# Patient Record
Sex: Female | Born: 1980 | Race: White | Hispanic: No | Smoking: Former smoker
Health system: Southern US, Community
[De-identification: ages and names within clinical notes are randomized; demographics above are authoritative.]

## PROBLEM LIST (undated history)

## (undated) DIAGNOSIS — F419 Anxiety disorder, unspecified: Secondary | ICD-10-CM

## (undated) DIAGNOSIS — E079 Disorder of thyroid, unspecified: Secondary | ICD-10-CM

## (undated) DIAGNOSIS — F32A Depression, unspecified: Secondary | ICD-10-CM

## (undated) DIAGNOSIS — R51 Headache: Secondary | ICD-10-CM

## (undated) DIAGNOSIS — R519 Headache, unspecified: Secondary | ICD-10-CM

## (undated) DIAGNOSIS — G8929 Other chronic pain: Secondary | ICD-10-CM

## (undated) DIAGNOSIS — N730 Acute parametritis and pelvic cellulitis: Secondary | ICD-10-CM

## (undated) DIAGNOSIS — N3010 Interstitial cystitis (chronic) without hematuria: Secondary | ICD-10-CM

## (undated) DIAGNOSIS — R87619 Unspecified abnormal cytological findings in specimens from cervix uteri: Secondary | ICD-10-CM

## (undated) DIAGNOSIS — Z765 Malingerer [conscious simulation]: Secondary | ICD-10-CM

## (undated) DIAGNOSIS — N83209 Unspecified ovarian cyst, unspecified side: Secondary | ICD-10-CM

## (undated) DIAGNOSIS — F329 Major depressive disorder, single episode, unspecified: Secondary | ICD-10-CM

## (undated) DIAGNOSIS — IMO0002 Reserved for concepts with insufficient information to code with codable children: Secondary | ICD-10-CM

## (undated) DIAGNOSIS — T7840XA Allergy, unspecified, initial encounter: Secondary | ICD-10-CM

## (undated) DIAGNOSIS — M069 Rheumatoid arthritis, unspecified: Secondary | ICD-10-CM

## (undated) DIAGNOSIS — F1110 Opioid abuse, uncomplicated: Secondary | ICD-10-CM

## (undated) DIAGNOSIS — M542 Cervicalgia: Secondary | ICD-10-CM

## (undated) DIAGNOSIS — R000 Tachycardia, unspecified: Secondary | ICD-10-CM

## (undated) HISTORY — PX: TUBAL LIGATION: SHX77

## (undated) HISTORY — DX: Depression, unspecified: F32.A

## (undated) HISTORY — PX: ABDOMINAL HYSTERECTOMY: SHX81

## (undated) HISTORY — PX: LAPAROSCOPIC TUBAL LIGATION: SUR803

## (undated) HISTORY — DX: Allergy, unspecified, initial encounter: T78.40XA

## (undated) HISTORY — DX: Major depressive disorder, single episode, unspecified: F32.9

## (undated) HISTORY — DX: Reserved for concepts with insufficient information to code with codable children: IMO0002

## (undated) HISTORY — PX: HAND SURGERY: SHX662

## (undated) HISTORY — DX: Unspecified abnormal cytological findings in specimens from cervix uteri: R87.619

---
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
## 2002-01-11 ENCOUNTER — Inpatient Hospital Stay (HOSPITAL_COMMUNITY): Admission: AD | Admit: 2002-01-11 | Discharge: 2002-01-14 | Payer: Self-pay | Admitting: Obstetrics and Gynecology

## 2002-06-30 ENCOUNTER — Emergency Department (HOSPITAL_COMMUNITY): Admission: EM | Admit: 2002-06-30 | Discharge: 2002-06-30 | Payer: Self-pay | Admitting: Emergency Medicine

## 2002-08-28 ENCOUNTER — Inpatient Hospital Stay (HOSPITAL_COMMUNITY): Admission: AD | Admit: 2002-08-28 | Discharge: 2002-08-28 | Payer: Self-pay | Admitting: Obstetrics and Gynecology

## 2002-08-30 ENCOUNTER — Inpatient Hospital Stay (HOSPITAL_COMMUNITY): Admission: AD | Admit: 2002-08-30 | Discharge: 2002-08-30 | Payer: Self-pay | Admitting: Obstetrics and Gynecology

## 2002-09-09 ENCOUNTER — Inpatient Hospital Stay (HOSPITAL_COMMUNITY): Admission: AD | Admit: 2002-09-09 | Discharge: 2002-09-09 | Payer: Self-pay | Admitting: Family Medicine

## 2002-09-11 ENCOUNTER — Encounter: Payer: Self-pay | Admitting: Obstetrics and Gynecology

## 2002-09-11 ENCOUNTER — Ambulatory Visit (HOSPITAL_COMMUNITY): Admission: RE | Admit: 2002-09-11 | Discharge: 2002-09-11 | Payer: Self-pay | Admitting: Obstetrics and Gynecology

## 2002-10-20 ENCOUNTER — Encounter: Payer: Self-pay | Admitting: *Deleted

## 2002-10-20 ENCOUNTER — Inpatient Hospital Stay (HOSPITAL_COMMUNITY): Admission: AD | Admit: 2002-10-20 | Discharge: 2002-10-20 | Payer: Self-pay | Admitting: Family Medicine

## 2002-10-24 ENCOUNTER — Inpatient Hospital Stay (HOSPITAL_COMMUNITY): Admission: AD | Admit: 2002-10-24 | Discharge: 2002-10-24 | Payer: Self-pay | Admitting: Obstetrics and Gynecology

## 2002-12-16 ENCOUNTER — Encounter: Payer: Self-pay | Admitting: Emergency Medicine

## 2002-12-16 ENCOUNTER — Emergency Department (HOSPITAL_COMMUNITY): Admission: EM | Admit: 2002-12-16 | Discharge: 2002-12-17 | Payer: Self-pay | Admitting: Emergency Medicine

## 2003-02-13 ENCOUNTER — Emergency Department (HOSPITAL_COMMUNITY): Admission: EM | Admit: 2003-02-13 | Discharge: 2003-02-13 | Payer: Self-pay | Admitting: Emergency Medicine

## 2003-04-08 ENCOUNTER — Emergency Department (HOSPITAL_COMMUNITY): Admission: EM | Admit: 2003-04-08 | Discharge: 2003-04-08 | Payer: Self-pay | Admitting: Emergency Medicine

## 2003-04-08 ENCOUNTER — Encounter: Payer: Self-pay | Admitting: Emergency Medicine

## 2003-04-09 ENCOUNTER — Emergency Department (HOSPITAL_COMMUNITY): Admission: EM | Admit: 2003-04-09 | Discharge: 2003-04-09 | Payer: Self-pay | Admitting: Emergency Medicine

## 2003-07-04 ENCOUNTER — Emergency Department (HOSPITAL_COMMUNITY): Admission: EM | Admit: 2003-07-04 | Discharge: 2003-07-04 | Payer: Self-pay | Admitting: Emergency Medicine

## 2003-07-21 ENCOUNTER — Emergency Department (HOSPITAL_COMMUNITY): Admission: EM | Admit: 2003-07-21 | Discharge: 2003-07-21 | Payer: Self-pay | Admitting: Emergency Medicine

## 2003-07-30 ENCOUNTER — Inpatient Hospital Stay (HOSPITAL_COMMUNITY): Admission: AD | Admit: 2003-07-30 | Discharge: 2003-07-30 | Payer: Self-pay | Admitting: Obstetrics & Gynecology

## 2003-08-07 ENCOUNTER — Inpatient Hospital Stay (HOSPITAL_COMMUNITY): Admission: AD | Admit: 2003-08-07 | Discharge: 2003-08-08 | Payer: Self-pay | Admitting: *Deleted

## 2003-08-13 ENCOUNTER — Inpatient Hospital Stay (HOSPITAL_COMMUNITY): Admission: AD | Admit: 2003-08-13 | Discharge: 2003-08-13 | Payer: Self-pay | Admitting: Obstetrics & Gynecology

## 2003-10-03 ENCOUNTER — Inpatient Hospital Stay (HOSPITAL_COMMUNITY): Admission: AD | Admit: 2003-10-03 | Discharge: 2003-10-03 | Payer: Self-pay | Admitting: Obstetrics and Gynecology

## 2003-11-21 ENCOUNTER — Emergency Department (HOSPITAL_COMMUNITY): Admission: EM | Admit: 2003-11-21 | Discharge: 2003-11-21 | Payer: Self-pay | Admitting: Emergency Medicine

## 2003-12-29 ENCOUNTER — Inpatient Hospital Stay (HOSPITAL_COMMUNITY): Admission: AD | Admit: 2003-12-29 | Discharge: 2003-12-29 | Payer: Self-pay | Admitting: *Deleted

## 2004-01-02 ENCOUNTER — Emergency Department (HOSPITAL_COMMUNITY): Admission: EM | Admit: 2004-01-02 | Discharge: 2004-01-02 | Payer: Self-pay | Admitting: Emergency Medicine

## 2004-03-05 ENCOUNTER — Inpatient Hospital Stay (HOSPITAL_COMMUNITY): Admission: AD | Admit: 2004-03-05 | Discharge: 2004-03-05 | Payer: Self-pay | Admitting: Obstetrics and Gynecology

## 2004-03-22 ENCOUNTER — Emergency Department (HOSPITAL_COMMUNITY): Admission: EM | Admit: 2004-03-22 | Discharge: 2004-03-22 | Payer: Self-pay | Admitting: Emergency Medicine

## 2004-05-03 ENCOUNTER — Emergency Department (HOSPITAL_COMMUNITY): Admission: EM | Admit: 2004-05-03 | Discharge: 2004-05-03 | Payer: Self-pay | Admitting: Emergency Medicine

## 2004-07-02 ENCOUNTER — Emergency Department (HOSPITAL_COMMUNITY): Admission: EM | Admit: 2004-07-02 | Discharge: 2004-07-02 | Payer: Self-pay | Admitting: Emergency Medicine

## 2004-08-28 ENCOUNTER — Emergency Department (HOSPITAL_COMMUNITY): Admission: EM | Admit: 2004-08-28 | Discharge: 2004-08-28 | Payer: Self-pay | Admitting: Emergency Medicine

## 2005-01-25 ENCOUNTER — Emergency Department (HOSPITAL_COMMUNITY): Admission: EM | Admit: 2005-01-25 | Discharge: 2005-01-25 | Payer: Self-pay | Admitting: Emergency Medicine

## 2005-02-11 ENCOUNTER — Inpatient Hospital Stay (HOSPITAL_COMMUNITY): Admission: AD | Admit: 2005-02-11 | Discharge: 2005-02-12 | Payer: Self-pay | Admitting: Obstetrics and Gynecology

## 2005-05-26 ENCOUNTER — Emergency Department (HOSPITAL_COMMUNITY): Admission: EM | Admit: 2005-05-26 | Discharge: 2005-05-27 | Payer: Self-pay | Admitting: Emergency Medicine

## 2005-05-31 ENCOUNTER — Emergency Department (HOSPITAL_COMMUNITY): Admission: EM | Admit: 2005-05-31 | Discharge: 2005-05-31 | Payer: Self-pay | Admitting: Emergency Medicine

## 2005-08-13 ENCOUNTER — Inpatient Hospital Stay (HOSPITAL_COMMUNITY): Admission: AD | Admit: 2005-08-13 | Discharge: 2005-08-13 | Payer: Self-pay | Admitting: Obstetrics & Gynecology

## 2005-08-14 ENCOUNTER — Encounter: Admission: RE | Admit: 2005-08-14 | Discharge: 2005-08-14 | Payer: Self-pay | Admitting: Neurology

## 2005-08-28 ENCOUNTER — Other Ambulatory Visit: Admission: RE | Admit: 2005-08-28 | Discharge: 2005-08-28 | Payer: Self-pay | Admitting: Obstetrics and Gynecology

## 2005-11-23 ENCOUNTER — Emergency Department (HOSPITAL_COMMUNITY): Admission: EM | Admit: 2005-11-23 | Discharge: 2005-11-23 | Payer: Self-pay | Admitting: *Deleted

## 2006-04-12 ENCOUNTER — Emergency Department (HOSPITAL_COMMUNITY): Admission: EM | Admit: 2006-04-12 | Discharge: 2006-04-12 | Payer: Self-pay | Admitting: Emergency Medicine

## 2007-01-20 ENCOUNTER — Emergency Department (HOSPITAL_COMMUNITY): Admission: EM | Admit: 2007-01-20 | Discharge: 2007-01-20 | Payer: Self-pay | Admitting: Emergency Medicine

## 2007-09-04 ENCOUNTER — Emergency Department (HOSPITAL_COMMUNITY): Admission: EM | Admit: 2007-09-04 | Discharge: 2007-09-04 | Payer: Self-pay | Admitting: Emergency Medicine

## 2007-11-08 ENCOUNTER — Emergency Department (HOSPITAL_COMMUNITY): Admission: EM | Admit: 2007-11-08 | Discharge: 2007-11-08 | Payer: Self-pay | Admitting: Emergency Medicine

## 2007-12-09 ENCOUNTER — Emergency Department (HOSPITAL_COMMUNITY): Admission: EM | Admit: 2007-12-09 | Discharge: 2007-12-09 | Payer: Self-pay | Admitting: Emergency Medicine

## 2008-03-28 ENCOUNTER — Emergency Department (HOSPITAL_COMMUNITY): Admission: EM | Admit: 2008-03-28 | Discharge: 2008-03-28 | Payer: Self-pay | Admitting: Physician Assistant

## 2008-07-18 ENCOUNTER — Emergency Department (HOSPITAL_COMMUNITY): Admission: EM | Admit: 2008-07-18 | Discharge: 2008-07-18 | Payer: Self-pay | Admitting: Emergency Medicine

## 2008-09-02 ENCOUNTER — Emergency Department (HOSPITAL_COMMUNITY): Admission: EM | Admit: 2008-09-02 | Discharge: 2008-09-02 | Payer: Self-pay | Admitting: Emergency Medicine

## 2008-09-11 ENCOUNTER — Emergency Department (HOSPITAL_COMMUNITY): Admission: EM | Admit: 2008-09-11 | Discharge: 2008-09-11 | Payer: Self-pay | Admitting: Emergency Medicine

## 2008-10-18 ENCOUNTER — Emergency Department (HOSPITAL_COMMUNITY): Admission: EM | Admit: 2008-10-18 | Discharge: 2008-10-18 | Payer: Self-pay | Admitting: Emergency Medicine

## 2009-01-28 ENCOUNTER — Inpatient Hospital Stay (HOSPITAL_COMMUNITY): Admission: AD | Admit: 2009-01-28 | Discharge: 2009-01-28 | Payer: Self-pay | Admitting: Obstetrics & Gynecology

## 2009-02-03 ENCOUNTER — Emergency Department (HOSPITAL_COMMUNITY): Admission: EM | Admit: 2009-02-03 | Discharge: 2009-02-03 | Payer: Self-pay | Admitting: Emergency Medicine

## 2009-02-18 ENCOUNTER — Inpatient Hospital Stay (HOSPITAL_COMMUNITY): Admission: AD | Admit: 2009-02-18 | Discharge: 2009-02-18 | Payer: Self-pay | Admitting: Obstetrics & Gynecology

## 2009-02-28 ENCOUNTER — Emergency Department (HOSPITAL_COMMUNITY): Admission: EM | Admit: 2009-02-28 | Discharge: 2009-02-28 | Payer: Self-pay | Admitting: Emergency Medicine

## 2009-03-21 ENCOUNTER — Ambulatory Visit: Payer: Self-pay | Admitting: Family Medicine

## 2009-05-16 ENCOUNTER — Emergency Department (HOSPITAL_COMMUNITY): Admission: EM | Admit: 2009-05-16 | Discharge: 2009-05-16 | Payer: Self-pay | Admitting: Emergency Medicine

## 2009-06-30 ENCOUNTER — Emergency Department (HOSPITAL_COMMUNITY): Admission: EM | Admit: 2009-06-30 | Discharge: 2009-06-30 | Payer: Self-pay | Admitting: Emergency Medicine

## 2009-06-30 ENCOUNTER — Emergency Department (HOSPITAL_COMMUNITY): Admission: EM | Admit: 2009-06-30 | Discharge: 2009-06-30 | Payer: Self-pay | Admitting: Family Medicine

## 2009-07-29 ENCOUNTER — Emergency Department (HOSPITAL_COMMUNITY): Admission: EM | Admit: 2009-07-29 | Discharge: 2009-07-29 | Payer: Self-pay | Admitting: Emergency Medicine

## 2009-07-30 ENCOUNTER — Emergency Department (HOSPITAL_COMMUNITY): Admission: EM | Admit: 2009-07-30 | Discharge: 2009-07-30 | Payer: Self-pay | Admitting: Emergency Medicine

## 2009-08-21 ENCOUNTER — Emergency Department (HOSPITAL_COMMUNITY): Admission: EM | Admit: 2009-08-21 | Discharge: 2009-08-21 | Payer: Self-pay | Admitting: Family Medicine

## 2009-08-24 ENCOUNTER — Emergency Department (HOSPITAL_COMMUNITY): Admission: EM | Admit: 2009-08-24 | Discharge: 2009-08-24 | Payer: Self-pay | Admitting: Emergency Medicine

## 2009-10-03 ENCOUNTER — Inpatient Hospital Stay (HOSPITAL_COMMUNITY): Admission: AD | Admit: 2009-10-03 | Discharge: 2009-10-03 | Payer: Self-pay | Admitting: Family Medicine

## 2009-10-31 ENCOUNTER — Emergency Department (HOSPITAL_COMMUNITY): Admission: EM | Admit: 2009-10-31 | Discharge: 2009-10-31 | Payer: Self-pay | Admitting: Emergency Medicine

## 2010-05-07 IMAGING — CR DG ELBOW COMPLETE 3+V*L*
4 series · 4 of 4 positions shown · non-contrast
Comparison: None

CLINICAL DATA: Left arm pain

LEFT ELBOW - COMPLETE 3+ VIEW

[x elbow joint ap left]
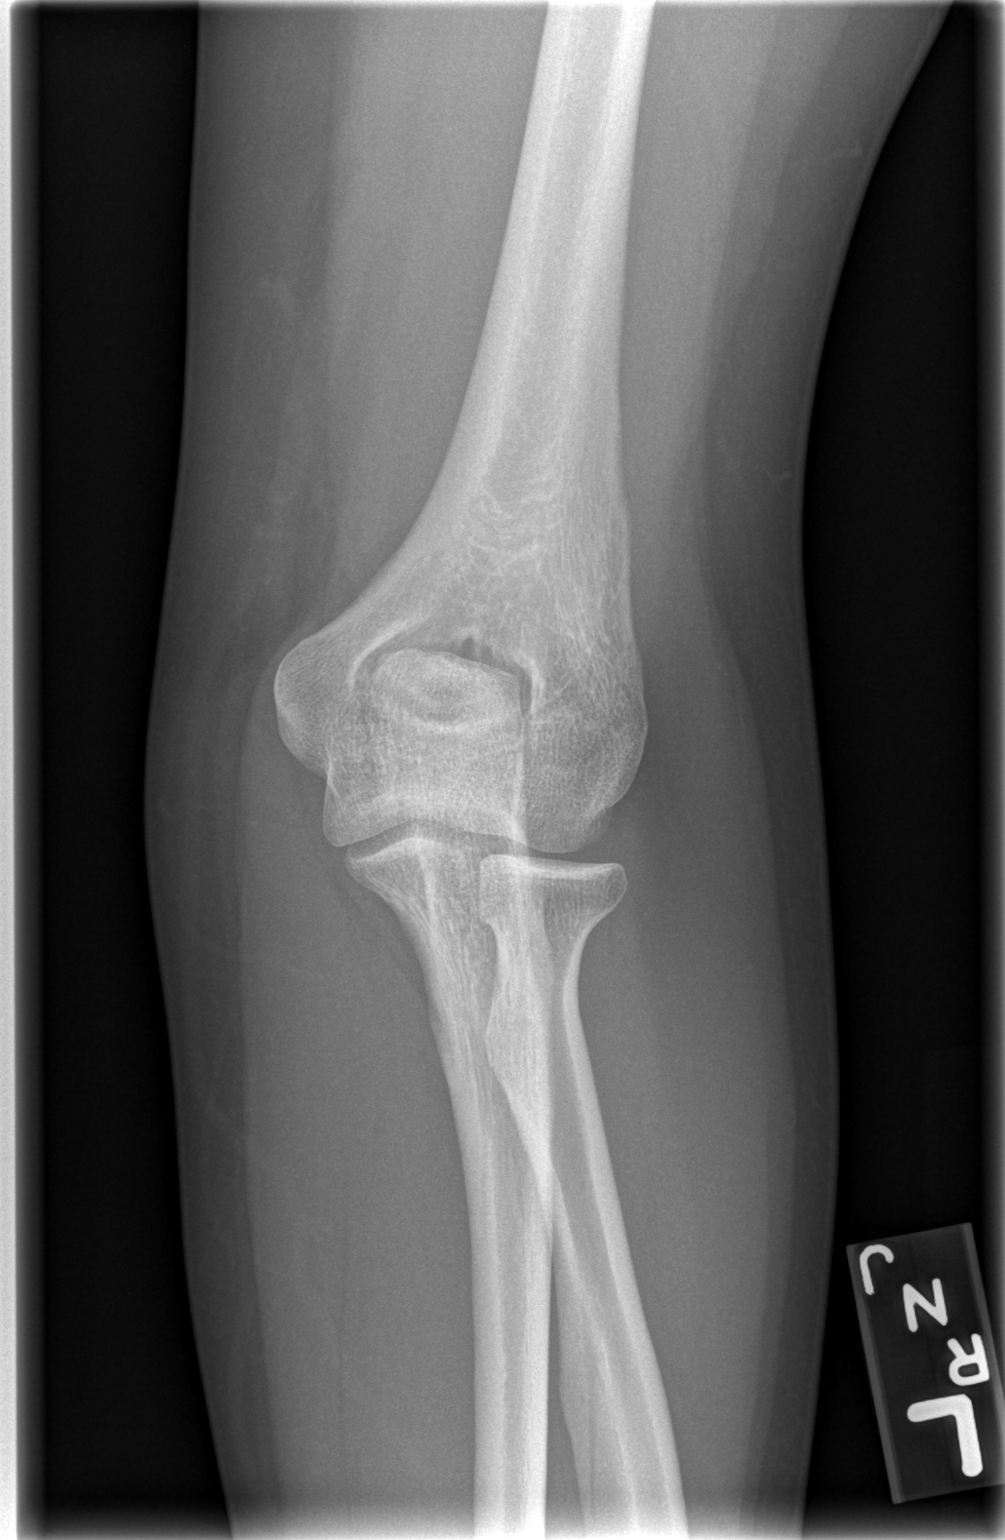

[x elbow joint obl. left (1 of 2)]
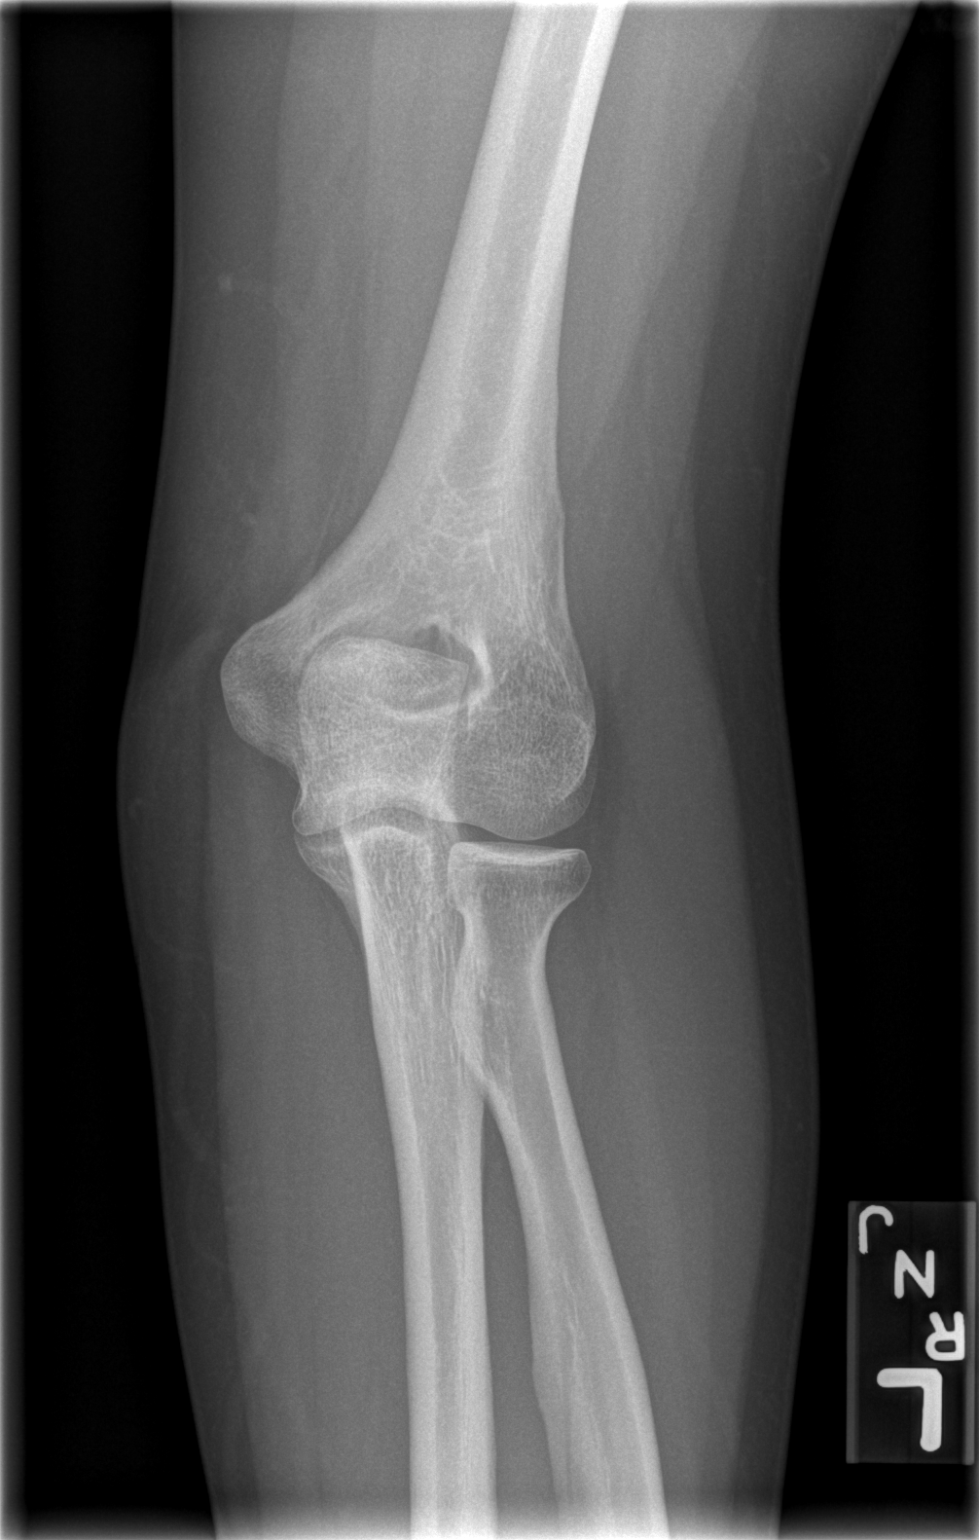

[x elbow joint obl. left (2 of 2)]
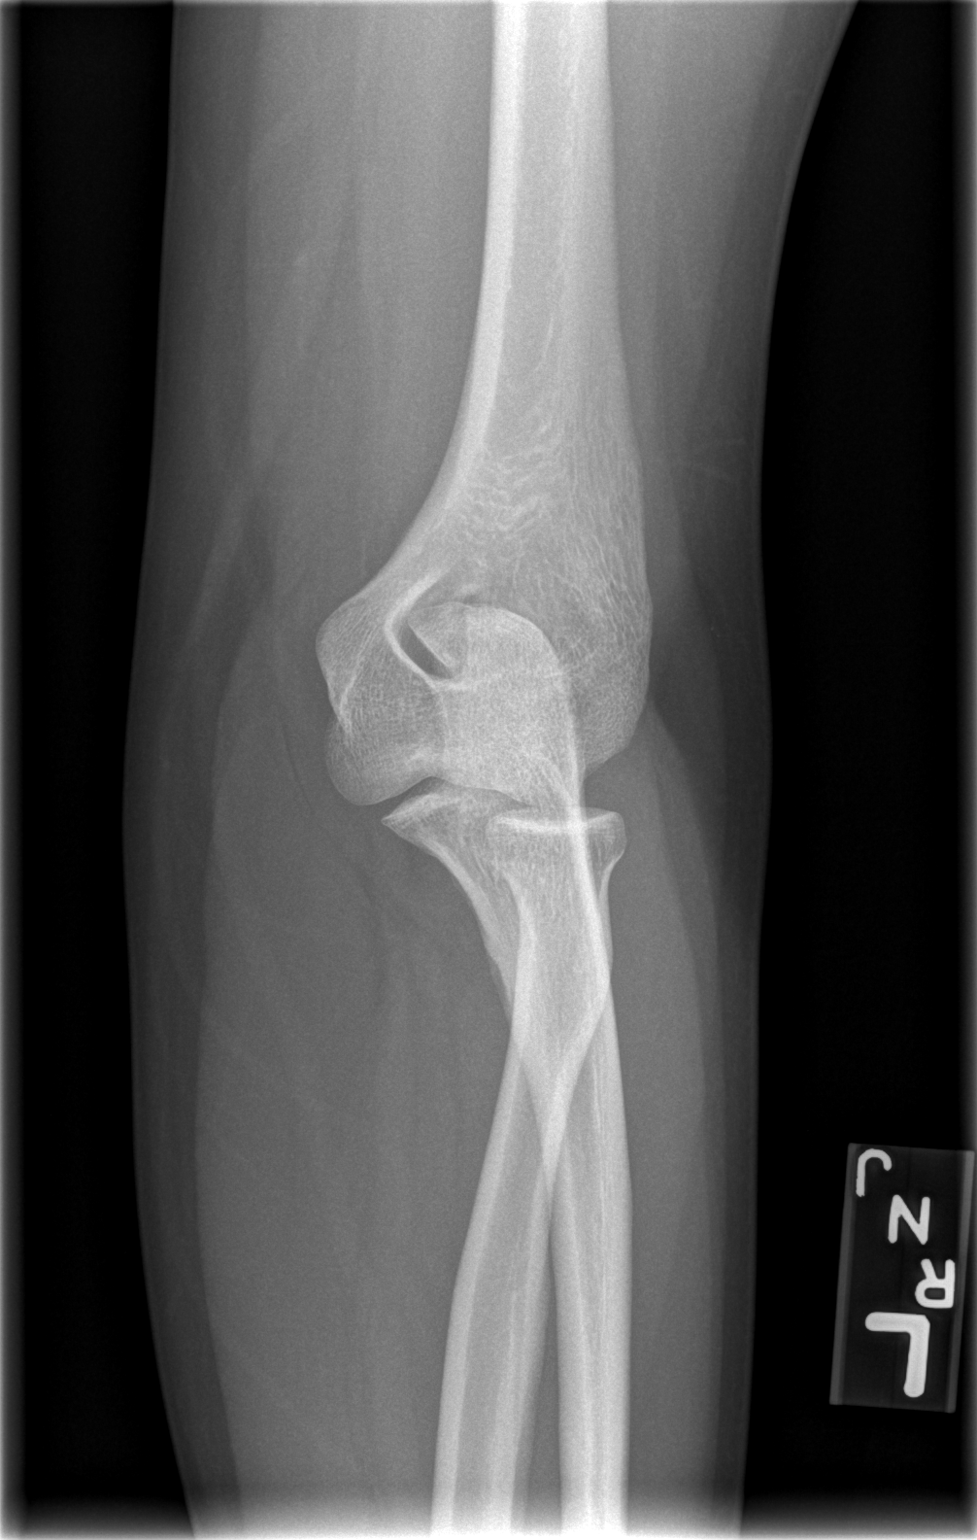

[x elbow joint lat left]
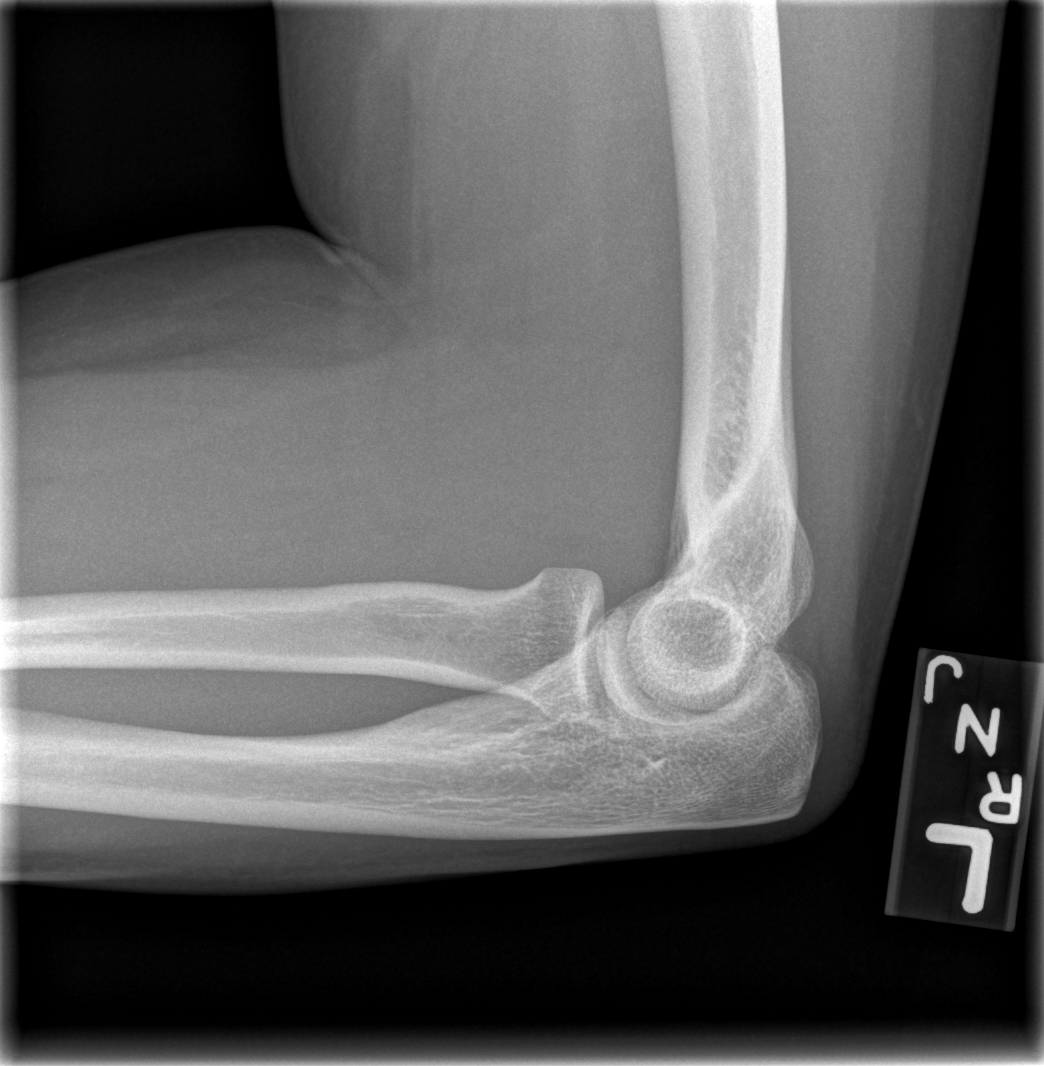

[4 of 4 positions shown; findings below may reference images not displayed]

FINDINGS: Bone mineralization normal.
Joint spaces preserved.
No fracture, dislocation, or bone destruction.
No joint effusion.
IMPRESSION: No acute abnormalities.

## 2010-05-18 ENCOUNTER — Emergency Department (HOSPITAL_COMMUNITY): Admission: EM | Admit: 2010-05-18 | Discharge: 2010-05-18 | Payer: Self-pay | Admitting: Emergency Medicine

## 2010-06-16 ENCOUNTER — Inpatient Hospital Stay (HOSPITAL_COMMUNITY): Admission: AD | Admit: 2010-06-16 | Discharge: 2010-06-16 | Payer: Self-pay | Admitting: Obstetrics & Gynecology

## 2010-06-16 ENCOUNTER — Ambulatory Visit: Payer: Self-pay | Admitting: Obstetrics and Gynecology

## 2010-06-25 ENCOUNTER — Inpatient Hospital Stay (HOSPITAL_COMMUNITY): Admission: AD | Admit: 2010-06-25 | Discharge: 2010-06-25 | Payer: Self-pay | Admitting: Obstetrics and Gynecology

## 2010-06-25 ENCOUNTER — Ambulatory Visit: Payer: Self-pay | Admitting: Obstetrics and Gynecology

## 2010-07-02 ENCOUNTER — Emergency Department (HOSPITAL_COMMUNITY): Admission: EM | Admit: 2010-07-02 | Discharge: 2010-07-02 | Payer: Self-pay | Admitting: Emergency Medicine

## 2010-07-03 ENCOUNTER — Ambulatory Visit: Payer: Self-pay | Admitting: Obstetrics & Gynecology

## 2010-07-03 LAB — CONVERTED CEMR LAB
Antibody Screen: NEGATIVE
Basophils Relative: 0 % (ref 0–1)
Hemoglobin: 11.8 g/dL — ABNORMAL LOW (ref 12.0–15.0)
Lymphocytes Relative: 21 % (ref 12–46)
Lymphs Abs: 1.9 10*3/uL (ref 0.7–4.0)
MCHC: 32 g/dL (ref 30.0–36.0)
Monocytes Absolute: 0.6 10*3/uL (ref 0.1–1.0)
Monocytes Relative: 7 % (ref 3–12)
Neutro Abs: 6.3 10*3/uL (ref 1.7–7.7)
Neutrophils Relative %: 70 % (ref 43–77)
RBC: 5.01 M/uL (ref 3.87–5.11)
Rubella: 24.5 intl units/mL — ABNORMAL HIGH
TSH: 6.58 microintl units/mL — ABNORMAL HIGH (ref 0.350–4.500)
WBC: 8.9 10*3/uL (ref 4.0–10.5)

## 2010-07-06 ENCOUNTER — Emergency Department (HOSPITAL_COMMUNITY): Admission: EM | Admit: 2010-07-06 | Discharge: 2010-07-06 | Payer: Self-pay | Admitting: Emergency Medicine

## 2010-07-08 ENCOUNTER — Ambulatory Visit: Payer: Self-pay | Admitting: Obstetrics & Gynecology

## 2010-07-08 ENCOUNTER — Other Ambulatory Visit: Admission: RE | Admit: 2010-07-08 | Discharge: 2010-07-08 | Payer: Self-pay | Admitting: Obstetrics & Gynecology

## 2010-07-17 ENCOUNTER — Ambulatory Visit: Payer: Self-pay | Admitting: Obstetrics and Gynecology

## 2010-07-28 ENCOUNTER — Ambulatory Visit: Payer: Self-pay | Admitting: Family

## 2010-07-28 ENCOUNTER — Inpatient Hospital Stay (HOSPITAL_COMMUNITY): Admission: AD | Admit: 2010-07-28 | Discharge: 2010-07-28 | Payer: Self-pay | Admitting: Obstetrics & Gynecology

## 2010-07-31 ENCOUNTER — Ambulatory Visit (HOSPITAL_COMMUNITY): Admission: RE | Admit: 2010-07-31 | Discharge: 2010-07-31 | Payer: Self-pay | Admitting: Obstetrics & Gynecology

## 2010-08-07 ENCOUNTER — Ambulatory Visit: Payer: Self-pay | Admitting: Obstetrics & Gynecology

## 2010-08-12 ENCOUNTER — Ambulatory Visit: Payer: Self-pay | Admitting: Family Medicine

## 2010-08-12 LAB — CONVERTED CEMR LAB
MCHC: 32.1 g/dL (ref 30.0–36.0)
MCV: 75.6 fL — ABNORMAL LOW (ref 78.0–100.0)
RBC: 4.95 M/uL (ref 3.87–5.11)
RDW: 16.8 % — ABNORMAL HIGH (ref 11.5–15.5)
TSH: 2.372 microintl units/mL (ref 0.350–4.500)

## 2010-08-13 ENCOUNTER — Inpatient Hospital Stay (HOSPITAL_COMMUNITY): Admission: AD | Admit: 2010-08-13 | Discharge: 2010-08-13 | Payer: Self-pay | Admitting: Obstetrics & Gynecology

## 2010-08-15 ENCOUNTER — Ambulatory Visit (HOSPITAL_COMMUNITY)
Admission: RE | Admit: 2010-08-15 | Discharge: 2010-08-15 | Payer: Self-pay | Source: Home / Self Care | Admitting: Obstetrics & Gynecology

## 2010-08-28 ENCOUNTER — Ambulatory Visit: Payer: Self-pay | Admitting: Obstetrics and Gynecology

## 2010-09-01 ENCOUNTER — Ambulatory Visit (HOSPITAL_COMMUNITY)
Admission: RE | Admit: 2010-09-01 | Discharge: 2010-09-01 | Payer: Self-pay | Source: Home / Self Care | Admitting: Internal Medicine

## 2010-09-02 ENCOUNTER — Emergency Department (HOSPITAL_COMMUNITY)
Admission: EM | Admit: 2010-09-02 | Discharge: 2010-09-02 | Payer: Self-pay | Source: Home / Self Care | Admitting: Emergency Medicine

## 2010-09-11 ENCOUNTER — Ambulatory Visit: Payer: Self-pay | Admitting: Obstetrics and Gynecology

## 2010-09-25 ENCOUNTER — Ambulatory Visit: Payer: Self-pay | Admitting: Obstetrics & Gynecology

## 2010-09-25 ENCOUNTER — Ambulatory Visit (HOSPITAL_COMMUNITY)
Admission: RE | Admit: 2010-09-25 | Discharge: 2010-09-25 | Payer: Self-pay | Source: Home / Self Care | Attending: Obstetrics & Gynecology | Admitting: Obstetrics & Gynecology

## 2010-10-08 ENCOUNTER — Ambulatory Visit: Admit: 2010-10-08 | Payer: Self-pay | Admitting: Obstetrics & Gynecology

## 2010-10-09 ENCOUNTER — Ambulatory Visit
Admission: RE | Admit: 2010-10-09 | Discharge: 2010-10-09 | Payer: Self-pay | Source: Home / Self Care | Attending: Obstetrics & Gynecology | Admitting: Obstetrics & Gynecology

## 2010-10-09 ENCOUNTER — Encounter (INDEPENDENT_AMBULATORY_CARE_PROVIDER_SITE_OTHER): Payer: Self-pay | Admitting: *Deleted

## 2010-10-09 LAB — CONVERTED CEMR LAB: TSH: 7.364 u[IU]/mL — ABNORMAL HIGH

## 2010-10-23 ENCOUNTER — Inpatient Hospital Stay (HOSPITAL_COMMUNITY)
Admission: AD | Admit: 2010-10-23 | Discharge: 2010-10-23 | Payer: Self-pay | Source: Home / Self Care | Attending: Obstetrics & Gynecology | Admitting: Obstetrics & Gynecology

## 2010-10-23 ENCOUNTER — Ambulatory Visit
Admission: RE | Admit: 2010-10-23 | Discharge: 2010-10-23 | Payer: Self-pay | Source: Home / Self Care | Attending: Obstetrics & Gynecology | Admitting: Obstetrics & Gynecology

## 2010-10-28 ENCOUNTER — Ambulatory Visit
Admission: RE | Admit: 2010-10-28 | Discharge: 2010-10-28 | Payer: Self-pay | Source: Home / Self Care | Attending: Obstetrics & Gynecology | Admitting: Obstetrics & Gynecology

## 2010-10-28 ENCOUNTER — Encounter: Payer: Self-pay | Admitting: Obstetrics & Gynecology

## 2010-10-29 ENCOUNTER — Encounter: Payer: Self-pay | Admitting: Obstetrics & Gynecology

## 2010-10-29 LAB — CONVERTED CEMR LAB
Basophils Relative: 0 % (ref 0–1)
Eosinophils Absolute: 0.1 10*3/uL (ref 0.0–0.7)
Eosinophils Relative: 1 % (ref 0–5)
HCT: 36.2 % (ref 36.0–46.0)
Lymphs Abs: 1.4 10*3/uL (ref 0.7–4.0)
MCHC: 29.8 g/dL — ABNORMAL LOW (ref 30.0–36.0)
MCV: 76.9 fL — ABNORMAL LOW (ref 78.0–100.0)
Monocytes Relative: 4 % (ref 3–12)
Platelets: 317 10*3/uL (ref 150–400)
RBC: 4.71 M/uL (ref 3.87–5.11)
WBC: 8.7 10*3/uL (ref 4.0–10.5)

## 2010-11-17 ENCOUNTER — Encounter: Payer: Self-pay | Admitting: Obstetrics & Gynecology

## 2010-11-24 ENCOUNTER — Encounter: Payer: Self-pay | Admitting: Obstetrics & Gynecology

## 2010-11-24 DIAGNOSIS — G43109 Migraine with aura, not intractable, without status migrainosus: Secondary | ICD-10-CM

## 2010-11-24 DIAGNOSIS — Z3480 Encounter for supervision of other normal pregnancy, unspecified trimester: Secondary | ICD-10-CM

## 2010-12-08 ENCOUNTER — Encounter: Payer: Self-pay | Admitting: Obstetrics & Gynecology

## 2010-12-09 ENCOUNTER — Encounter: Payer: Self-pay | Admitting: Obstetrics and Gynecology

## 2010-12-09 DIAGNOSIS — Z3480 Encounter for supervision of other normal pregnancy, unspecified trimester: Secondary | ICD-10-CM

## 2010-12-09 LAB — URINALYSIS, ROUTINE W REFLEX MICROSCOPIC
Bilirubin Urine: NEGATIVE
Glucose, UA: NEGATIVE mg/dL
Hgb urine dipstick: NEGATIVE
Ketones, ur: NEGATIVE mg/dL
Protein, ur: NEGATIVE mg/dL

## 2010-12-10 LAB — POCT I-STAT, CHEM 8
HCT: 36 % (ref 36.0–46.0)
Hemoglobin: 12.2 g/dL (ref 12.0–15.0)
Potassium: 4.1 mEq/L (ref 3.5–5.1)
Sodium: 136 mEq/L (ref 135–145)
TCO2: 24 mmol/L (ref 0–100)

## 2010-12-11 LAB — URINALYSIS, ROUTINE W REFLEX MICROSCOPIC
Glucose, UA: NEGATIVE mg/dL
Hgb urine dipstick: NEGATIVE
Nitrite: NEGATIVE
Protein, ur: NEGATIVE mg/dL
Protein, ur: NEGATIVE mg/dL
Specific Gravity, Urine: 1.02 (ref 1.005–1.030)
Specific Gravity, Urine: >1.03 — ABNORMAL HIGH (ref 1.005–1.030)
Urobilinogen, UA: 0.2 mg/dL (ref 0.0–1.0)
Urobilinogen, UA: 0.2 mg/dL (ref 0.0–1.0)

## 2010-12-11 LAB — WET PREP, GENITAL
Clue Cells Wet Prep HPF POC: NONE SEEN
Trich, Wet Prep: NONE SEEN

## 2010-12-11 LAB — POCT PREGNANCY, URINE: Preg Test, Ur: POSITIVE

## 2010-12-11 LAB — GC/CHLAMYDIA PROBE AMP, GENITAL: GC Probe Amp, Genital: NEGATIVE

## 2010-12-13 LAB — HERPES SIMPLEX VIRUS CULTURE

## 2010-12-14 ENCOUNTER — Inpatient Hospital Stay (HOSPITAL_COMMUNITY)
Admission: AD | Admit: 2010-12-14 | Discharge: 2010-12-14 | Disposition: A | Discharge status: Home / Self Care | Payer: Medicaid Other | Source: Ambulatory Visit | Attending: Obstetrics & Gynecology | Admitting: Obstetrics & Gynecology

## 2010-12-14 DIAGNOSIS — IMO0002 Reserved for concepts with insufficient information to code with codable children: Secondary | ICD-10-CM | POA: Insufficient documentation

## 2010-12-14 LAB — URINALYSIS, ROUTINE W REFLEX MICROSCOPIC
Bilirubin Urine: NEGATIVE
Glucose, UA: NEGATIVE mg/dL
Ketones, ur: NEGATIVE mg/dL
Protein, ur: NEGATIVE mg/dL
pH: 6 (ref 5.0–8.0)

## 2010-12-22 ENCOUNTER — Encounter: Payer: Self-pay | Admitting: Obstetrics & Gynecology

## 2010-12-29 ENCOUNTER — Encounter: Payer: Self-pay | Admitting: Obstetrics and Gynecology

## 2010-12-29 DIAGNOSIS — Z3480 Encounter for supervision of other normal pregnancy, unspecified trimester: Secondary | ICD-10-CM

## 2011-01-01 ENCOUNTER — Inpatient Hospital Stay (HOSPITAL_COMMUNITY)
Admission: RE | Admit: 2011-01-01 | Discharge: 2011-01-01 | Disposition: A | Discharge status: Home / Self Care | Payer: Medicaid Other | Source: Ambulatory Visit | Attending: Obstetrics & Gynecology | Admitting: Obstetrics & Gynecology

## 2011-01-01 DIAGNOSIS — O139 Gestational [pregnancy-induced] hypertension without significant proteinuria, unspecified trimester: Secondary | ICD-10-CM

## 2011-01-01 LAB — CBC
HCT: 34.7 % — ABNORMAL LOW (ref 36.0–46.0)
Hemoglobin: 10.7 g/dL — ABNORMAL LOW (ref 12.0–15.0)
MCH: 22.3 pg — ABNORMAL LOW (ref 26.0–34.0)
MCHC: 30.8 g/dL (ref 30.0–36.0)
MCV: 72.3 fL — ABNORMAL LOW (ref 78.0–100.0)
RBC: 4.8 MIL/uL (ref 3.87–5.11)

## 2011-01-01 LAB — COMPREHENSIVE METABOLIC PANEL
Albumin: 2.5 g/dL — ABNORMAL LOW (ref 3.5–5.2)
Alkaline Phosphatase: 255 U/L — ABNORMAL HIGH (ref 39–117)
BUN: 9 mg/dL (ref 6–23)
CO2: 23 mEq/L (ref 19–32)
Chloride: 106 mEq/L (ref 96–112)
GFR calc non Af Amer: >60 mL/min (ref >60)
Potassium: 4.2 mEq/L (ref 3.5–5.1)
Total Bilirubin: 0.4 mg/dL (ref 0.3–1.2)

## 2011-01-01 LAB — URINALYSIS, ROUTINE W REFLEX MICROSCOPIC
Bilirubin Urine: NEGATIVE
Glucose, UA: NEGATIVE mg/dL
Hgb urine dipstick: NEGATIVE
Ketones, ur: 15 mg/dL — AB
Protein, ur: NEGATIVE mg/dL
pH: 6 (ref 5.0–8.0)

## 2011-01-01 LAB — PROTEIN / CREATININE RATIO, URINE: Creatinine, Urine: 220.9 mg/dL

## 2011-01-03 LAB — TSH: TSH: 13.384 u[IU]/mL — ABNORMAL HIGH (ref 0.350–4.500)

## 2011-01-03 LAB — POCT I-STAT, CHEM 8
BUN: 8 mg/dL (ref 6–23)
Calcium, Ion: 1.21 mmol/L (ref 1.12–1.32)
Chloride: 106 mEq/L (ref 96–112)
Creatinine, Ser: 0.7 mg/dL (ref 0.4–1.2)
Sodium: 140 mEq/L (ref 135–145)

## 2011-01-06 ENCOUNTER — Inpatient Hospital Stay (HOSPITAL_COMMUNITY)
Admission: AD | Admit: 2011-01-06 | Discharge: 2011-01-08 | DRG: 767 | Disposition: A | Discharge status: Home / Self Care | Payer: Medicaid Other | Source: Ambulatory Visit | Attending: Family Medicine | Admitting: Family Medicine

## 2011-01-06 ENCOUNTER — Other Ambulatory Visit: Payer: Self-pay | Admitting: Obstetrics and Gynecology

## 2011-01-06 ENCOUNTER — Encounter: Payer: Self-pay | Admitting: Obstetrics and Gynecology

## 2011-01-06 DIAGNOSIS — Z302 Encounter for sterilization: Secondary | ICD-10-CM

## 2011-01-06 DIAGNOSIS — O34219 Maternal care for unspecified type scar from previous cesarean delivery: Secondary | ICD-10-CM

## 2011-01-06 LAB — DIFFERENTIAL
Basophils Absolute: 0 10*3/uL (ref 0.0–0.1)
Basophils Relative: 1 % (ref 0–1)
Eosinophils Relative: 2 % (ref 0–5)
Lymphocytes Relative: 25 % (ref 12–46)
Monocytes Absolute: 0.6 10*3/uL (ref 0.1–1.0)

## 2011-01-06 LAB — URINALYSIS, ROUTINE W REFLEX MICROSCOPIC
Glucose, UA: NEGATIVE mg/dL
Glucose, UA: NEGATIVE mg/dL
Hgb urine dipstick: NEGATIVE
Hgb urine dipstick: NEGATIVE
Protein, ur: NEGATIVE mg/dL
Protein, ur: NEGATIVE mg/dL
Specific Gravity, Urine: 1.025 (ref 1.005–1.030)
pH: 6.5 (ref 5.0–8.0)

## 2011-01-06 LAB — COMPREHENSIVE METABOLIC PANEL
AST: 23 U/L (ref 0–37)
Albumin: 2.6 g/dL — ABNORMAL LOW (ref 3.5–5.2)
Calcium: 8.9 mg/dL (ref 8.4–10.5)
Creatinine, Ser: 0.78 mg/dL (ref 0.4–1.2)
GFR calc Af Amer: >60 mL/min (ref >60)
Total Protein: 6.6 g/dL (ref 6.0–8.3)

## 2011-01-06 LAB — CBC
HCT: 35.6 % — ABNORMAL LOW (ref 36.0–46.0)
Hemoglobin: 11.9 g/dL — ABNORMAL LOW (ref 12.0–15.0)
MCH: 22.2 pg — ABNORMAL LOW (ref 26.0–34.0)
MCHC: 30.8 g/dL (ref 30.0–36.0)
MCHC: 33.4 g/dL (ref 30.0–36.0)
Platelets: 286 10*3/uL (ref 150–400)
RBC: 4.81 MIL/uL (ref 3.87–5.11)
RDW: 18.2 % — ABNORMAL HIGH (ref 11.5–15.5)

## 2011-01-06 LAB — URINE MICROSCOPIC-ADD ON: RBC / HPF: NONE SEEN RBC/hpf (ref <3)

## 2011-01-06 LAB — WET PREP, GENITAL
Clue Cells Wet Prep HPF POC: NONE SEEN
Trich, Wet Prep: NONE SEEN
Yeast Wet Prep HPF POC: NONE SEEN

## 2011-01-06 LAB — GC/CHLAMYDIA PROBE AMP, GENITAL
Chlamydia, DNA Probe: NEGATIVE
Chlamydia, DNA Probe: NEGATIVE
GC Probe Amp, Genital: NEGATIVE
GC Probe Amp, Genital: NEGATIVE

## 2011-01-06 LAB — STREP A DNA PROBE

## 2011-01-07 LAB — CBC
HCT: 26.4 % — ABNORMAL LOW (ref 36.0–46.0)
Hemoglobin: 8.1 g/dL — ABNORMAL LOW (ref 12.0–15.0)
MCV: 72.5 fL — ABNORMAL LOW (ref 78.0–100.0)
RBC: 3.64 MIL/uL — ABNORMAL LOW (ref 3.87–5.11)
WBC: 14.3 10*3/uL — ABNORMAL HIGH (ref 4.0–10.5)

## 2011-01-13 NOTE — Op Note (Addendum)
Dana Elliott, Dana Elliott                ACCOUNT NO.:  [id number]  MEDICAL RECORD NO.:  [id number]           PATIENT TYPE:  I  LOCATION:  9145                          FACILITY:  [facility]  PHYSICIAN:  Catalina Antigua, MD     DATE OF BIRTH:  1981-05-26  DATE OF PROCEDURE:  01/06/2011 DATE OF DISCHARGE:                              OPERATIVE REPORT   PREOPERATIVE DIAGNOSES: 1. Multiparity. 2. Desires permanent sterilization.  POSTOPERATIVE DIAGNOSES: 1. Multiparity. 2. Desires permanent sterilization.  PROCEDURE:  Postpartum bilateral tubal ligation via the modified Pomeroy method.  SURGEON:  Catalina Antigua, MD and Maryelizabeth Kaufmann, MD  ANESTHESIA:  Epidural.  IV FLUIDS:  800 mL.  URINE OUTPUT:  300 mL.  ESTIMATED BLOOD LOSS:  Minimal.  FINDINGS:  Normal adnexa and uterus bilaterally.  SPECIMENS:  Bilateral fallopian tubes that was sent to Pathology.  COMPLICATIONS:  Were none immediate.  INDICATIONS:  This is a 27-hour-old gravida 4, para 2-0-2-2 who is status post spontaneous vaginal delivery and she desires permanent sterilization.  Risks, benefits and alternatives were discussed with the patient including, but not limited to risk of failure, risk of ectopic, injury to internal organs and permanency of the procedure and alternatives were discussed as well including in a ID.  The patient decided to proceed with the bilateral tubal ligation given the aforementioned risks and benefits.  After informed consent was obtained, the patient was taken back to the OR suite.  PROCEDURE NOTE IN DETAIL:  The patient had an epidural catheter in place that was rebolused.  After the anesthesia was found to be adequate, the patient was placed in the dorsal supine position.  The patient was prepped and draped in normal sterile fashion.  Anesthesia was again tested and it was found to be adequate.  An infraumbilical incision was then made with the scalpel and carried down through to the  underlying fascia.  The peritoneum was then entered with the Mayo scissors and the fundus of the uterus was visualized and the right fallopian tube was then grasped with a Babcock clamp.  It was followed out to the fimbriae. It was then grasped from the isthmic region and using the modified Pomeroy method, the right fallopian tube was then ligated and then incised and then additional electrocautery was then used for additional hemostasis and adnexa was then returned to the abdomen and then in similar fashion the left fallopian tube was then visualized, grasped with a Babcock clamp, followed out to the fimbriae and in the isthmic region and in the modified Pomeroy method.  It was ligated using plain gut suture and incised.  One additional hemostasis was then obtained with a Bovie that adnexa was then returned to the abdomen.  Blood loss was minimal.  The fascia was then closed using a 0 Vicryl in a running fashion.  The skin was then closed in a subcuticular fashion with a 2-0 Vicryl and the patient tolerated the procedure well.  Lap, needle and sponge counts were correct x2.  The patient did not receive any antibiotics perioperatively.  The patient did receive vancomycin during her labor for GBS  positive prevention and the patient was taken back to the recovery area in stable condition.    ______________________________ Maryelizabeth Kaufmann, MD   ______________________________ Catalina Antigua, MD    LC/MEDQ  D:  01/06/2011  T:  01/07/2011  Job:  [id number]  Electronically Signed by Catalina Antigua  on 01/13/2011 09:18:15 AM Electronically Signed by Maryelizabeth Kaufmann MD on 01/21/2011 10:14:04 AM

## 2011-01-14 ENCOUNTER — Other Ambulatory Visit: Payer: Self-pay | Admitting: Family Medicine

## 2011-01-15 NOTE — Telephone Encounter (Signed)
Refill request

## 2011-01-28 ENCOUNTER — Inpatient Hospital Stay (HOSPITAL_COMMUNITY)
Admission: AD | Admit: 2011-01-28 | Discharge: 2011-01-28 | Disposition: A | Discharge status: Home / Self Care | Payer: Medicaid Other | Source: Ambulatory Visit | Attending: Obstetrics and Gynecology | Admitting: Obstetrics and Gynecology

## 2011-01-28 DIAGNOSIS — O909 Complication of the puerperium, unspecified: Secondary | ICD-10-CM

## 2011-02-09 ENCOUNTER — Emergency Department (HOSPITAL_COMMUNITY)
Admission: EM | Admit: 2011-02-09 | Discharge: 2011-02-09 | Disposition: A | Discharge status: Home / Self Care | Payer: Medicaid Other | Attending: Emergency Medicine | Admitting: Emergency Medicine

## 2011-02-09 DIAGNOSIS — E039 Hypothyroidism, unspecified: Secondary | ICD-10-CM | POA: Insufficient documentation

## 2011-02-09 DIAGNOSIS — R609 Edema, unspecified: Secondary | ICD-10-CM | POA: Insufficient documentation

## 2011-02-09 DIAGNOSIS — M7989 Other specified soft tissue disorders: Secondary | ICD-10-CM | POA: Insufficient documentation

## 2011-02-13 NOTE — Discharge Summary (Signed)
[facility]  Patient:    Dana Elliott, Dana Elliott                         MRN: [id number] Adm. Date:  [id number] Disc. Date: [id number] Attending:  Shaune Spittle Dictator:   Philipp Deputy, C.N.M.                           Discharge Summary  DATE OF BIRTH:                September 28, 1981.  ADMITTING DIAGNOSES:          1. Intrauterine pregnancy at term.                               2. Possibly early labor.                               3. Proteinuria with a pending 24-hour urine.                               4. Positive group B streptococcus.  DISCHARGE DIAGNOSES:          1. Intrauterine pregnancy at term.                               2. Normal spontaneous vaginal birth.                               3. Status post preeclampsia.                               4. Anemia.  HISTORY:                      Ms. Runnion is a 30 year old gravida 2, para 0-0-1-0 at 37-4/7 weeks who was admitted for induction of labor secondary to preeclampsia. She was evaluated initially on April 25, 2001 secondary to first episode of proteinuria. She had all other labs within normal limits at that time and normal growth on ultrasound and a reactive nonstress test. At that point, the plan was made to proceed with a 24-hour urine collection for protein as well as creatinine clearance. She presented at the office for delivery of her 24-hour urine and followup. Her blood pressure remained stable but she was noted to have 2+ protein on a voided specimen. Upon admission to the hospital, her cervix was 3 cm dilated, 90% effaced, with a vertex at the -1 station. The plan was to begin Pitocin induction of labor as well as magnesium sulfate therapy and clindamycin for group B strep prophylaxis. In the morning of April 28, 2001 her cervix had made slow progress through the night and at that time was 5 cm, 100% effaced, vertex at the -1 station. Membranes were ruptured for clear fluid and IUPC  placed for Pitocin management. From that point, the patient progressed to complete dilatation with rapid delivery of a viable female infant "Riki Rusk", Apgars 9 and 9, weight 6 pounds 1 ounce. The infant was taken to the full-term nursery and mom was taken to  the adult intensive care unit to continue her magnesium sulfate therapy.  On postpartum day #1 the patient had syncopal episodes x 3. Her blood pressure was stable, her pulse was tachycardic, and her hemoglobin at that point was 6.3 and it had been 8.4 upon admission. At that point, a blood transfusion was discussed with the patient as well as the risks and benefits. The patient verbalized acceptance of receiving three units of packed red blood cells. The patient received the red blood cells and at that point, her magnesium was discontinued as well. On the morning of postpartum day #2, she was doing well, feeling better. Her hemoglobin had rose to 9.8 and her orthostatic blood pressures were within normal limits. At that point, it was deemed she had received the full benefit of her hospital stay and she was discharged home in the evening of April 30, 2001.  DISCHARGE INSTRUCTIONS:       Discharge instructions are per [facility] handout.  DISCHARGE MEDICATIONS:        1. Motrin 600 mg p.o. q.6h. p.r.n.                               2. Feosol one p.o. b.i.d.                               3. Prenatal vitamin one p.o. q.d.  DISCHARGE FOLLOWUP:           The patient is to follow up in four weeks at [facility], to be started on Ortho Evra per patient request. DD:  04/30/01 TD:  05/02/01 Job: [id number]

## 2011-02-13 NOTE — Discharge Summary (Signed)
[facility]  Patient:    LENNOX, LEIKAM Visit Number: [id number] MRN: [id number]          Service Type: MED Location: 9300 [id number] [id number] Attending Physician:  Esmeralda Arthur Dictated by:   Saverio Danker, C.N.M. Admit Date:  01/11/2002 Discharge Date: 01/14/2002                             Discharge Summary  ADMISSION DIAGNOSES:          1. Intrauterine pregnancy at 12-1/[redacted] weeks                                  gestation.                               2. Pyelonephritis.  DISCHARGE DIAGNOSES:          1. Intrauterine pregnancy at 12-1/[redacted] weeks                                  gestation.                               2. Resolving pyelonephritis.  PROCEDURES THIS ADMISSION:    None.  HOSPITAL COURSE:              The patient is an 30 year old gravida 3, para 1-0-1-1, at about 12-1/[redacted] weeks gestation by ultrasound who was admitted for IV antibiotics and hydration with the diagnosis of pyelonephritis.  She underwent IV antibiotics and hydration, and was afebrile from 5 p.m. on January 12, 2002 to date.  She is now tolerating a regular diet.  She is ambulating, voiding, and eating without difficulty.  She as previously mentioned has been afebrile since 5 p.m. on January 12, 2002.  She is anxious to be discharged to home today.  She has a slight backache, but is much better than on admission.  She is deemed ready for discharge today.  DISCHARGE INSTRUCTIONS:       To call for a temperature greater than 100.4, increasing back pain, or nausea or vomiting, or of course vaginal bleeding.  DISCHARGE MEDICATIONS:        Macrobid one p.o. b.i.d. for 12 days or Septra one p.o. b.i.d. for 12 days depending on patients ability to pay for the prescription as she would like to be on the Macrobid, but was not sure she could afford it, so wanted also a prescription for Septra also.  DISCHARGE LABORATORY DATA:    Her urine culture was positive for E. coli  and sensitive to both Macrobid and Septra.  Her hemoglobin is 11.5, WBC count on admission was 14.6, and her platelets are 275.  DISCHARGE FOLLOWUP:           Will be as scheduled at [facility] or as needed. Dictated by:   Vance Gather Duplantis, C.N.M. Attending Physician:  Esmeralda Arthur DD:  01/14/02 TD:  01/16/02 Job: [id number]
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
## 2011-02-13 NOTE — H&P (Signed)
[facility]  Patient:    Dana Elliott, Dana Elliott                         MRN: [id number] Adm. Date:  [id number] Attending:  Shaune Spittle Dictator:   Vance Gather Duplantis, C.N.M.                         History and Physical  HISTORY OF PRESENT ILLNESS:   Ms. Weldon is a 30 year old gravida 2, para 0-0-1-0 at 60 and 4/7 weeks admitted for induction of labor secondary to preeclampsia. She was evaluated initially on July 29 secondary to first episode of proteinuria. She had all other labs within normal limits at that time and normal growth on ultrasound and a reactive NST. At which point, the plan was made to proceed with a 24-hour urine. She presents today for delivery of her 24-hour urine and follow-up. Her blood pressure is remaining stable, but she was noted to have 2+ protein on a voided specimen. Her cervix today also was more favorable, and she was reporting uterine contractions every 5 to 10 minutes that are mild. She denies any nausea or vomiting at this time. She denies any headaches or visual disturbances. She also denies any leaking or vaginal bleeding. Her pregnancy has been followed at [facility] to date by the certified midwife service and has been at risk for her being an adolescent, questionable LMP, and positive group B strep.  OBSTETRICAL HISTORY:          She is a gravida 2, para 0-0-1-0. Had an elective AB in July of 2001 with no complications. She reports no other GYN issues.  ALLERGIES:                    She is allergic to PENICILLIN. It gives her hives.  PAST MEDICAL HISTORY:         She reports not having had the usual childhood diseases. No other medical problems. She did fracture her left wrist in 1997.  FAMILY HISTORY:               Significant for mother with adult-onset diabetes. Maternal grandfather with diabetes and paternal grandfather with a CVA.  GENETIC HISTORY:              Significant only for the fact  that the father of the baby had an ASV replacement.  SOCIAL HISTORY:               She is single. The father of the baby is named Ambulance person. He is involved and supportive. She is employed part-time at ______ . He is employed full-time. They deny any illicit drug use, alcohol, or smoking with this pregnancy.  PRENATAL LABORATORY DATA:     Her blood type is O+. Her antibody screen is negative. Syphilis is nonreactive. Rubella is immune. Hepatitis B surface antigen is negative. HIV is nonreactive. Varicella is positive. Pap was within normal limits. GC and Chlamydia both ______ . Her 1-hour Glucola was 118. She declined a maternal serum alpha-fetoprotein. She had a low MCV at her initial visit and had a negative hemoglobin electrophoresis. Her group B strep is positive.  PHYSICAL EXAMINATION:  VITAL SIGNS:                  Her blood pressure is 118/80.  HEENT:  Grossly within normal limits.  HEART:                        Regular rate and rhythm.  CHEST:                        Clear.  BREASTS:                      Soft and nontender.  ABDOMEN:                      Gravid with irregular mild uterine contractions to palpation. Her fetal heart rate is in the 150s by Doppler. Her fundal height is about 36 cm.  PELVIC:                       Reveals 2 cm, 80%, vertex, -1 to 0, with intact membranes.  EXTREMITIES:                  Within normal limits.  ASSESSMENT:                   1. Intrauterine pregnancy at term.                               2. Possibly early labor.                               3. Proteinuria with a pending 24-hour urine.                               4. Positive group B strep.  PLAN:                         Admit to labor and delivery at [facility] for induction of labor per Dana Elliott, M.D. who will follow patient. The patient may require magnesium sulfate therapy if 24-hour urine is either unavailable or positive. DD:   04/27/01 TD:  04/27/01 Job: [id number]

## 2011-02-15 ENCOUNTER — Emergency Department (HOSPITAL_COMMUNITY)
Admission: EM | Admit: 2011-02-15 | Discharge: 2011-02-15 | Disposition: A | Discharge status: Home / Self Care | Payer: Medicaid Other | Attending: Emergency Medicine | Admitting: Emergency Medicine

## 2011-02-15 DIAGNOSIS — O99893 Other specified diseases and conditions complicating puerperium: Secondary | ICD-10-CM | POA: Insufficient documentation

## 2011-02-15 DIAGNOSIS — M129 Arthropathy, unspecified: Secondary | ICD-10-CM | POA: Insufficient documentation

## 2011-02-17 ENCOUNTER — Ambulatory Visit: Payer: Medicaid Other | Admitting: Obstetrics & Gynecology

## 2011-03-01 ENCOUNTER — Inpatient Hospital Stay (INDEPENDENT_AMBULATORY_CARE_PROVIDER_SITE_OTHER)
Admission: RE | Admit: 2011-03-01 | Discharge: 2011-03-01 | Disposition: A | Discharge status: Home / Self Care | Payer: Medicaid Other | Source: Ambulatory Visit | Attending: Emergency Medicine | Admitting: Emergency Medicine

## 2011-03-01 DIAGNOSIS — M069 Rheumatoid arthritis, unspecified: Secondary | ICD-10-CM

## 2011-03-04 ENCOUNTER — Other Ambulatory Visit (INDEPENDENT_AMBULATORY_CARE_PROVIDER_SITE_OTHER): Payer: Medicaid Other

## 2011-03-05 NOTE — Assessment & Plan Note (Signed)
Dana Elliott, Dana Elliott                ACCOUNT NO.:  [id number]  MEDICAL RECORD NO.:  [id number]           PATIENT TYPE:  LOCATION:  [facility]           FACILITY:  PHYSICIAN:  Jaynie Collins, MD     DATE OF BIRTH:  18-Sep-1981  DATE OF SERVICE:  03/04/2011                                 CLINIC NOTE  REASON FOR VISIT:  Postpartum visit.  [name] is a 30 year old, gravida 4, para 1-1-2-2, who is status post preterm delivery via spontaneous vaginal delivery of a female infant Janyth Pupa).  The patient went into spontaneous preterm labor and had an uncomplicated delivery.  After her delivery, the patient did undergo a postpartum bilateral tubal sterilization.  She does report having initial problems with her incision healing due to superficial reaction to the suture, but once she removed the suture the incision healed beautifully.  The patient also reports that she is bottle-feeding.  The baby is doing well.  She does have some support from the baby's father and her rest of her family in taking care of the baby.  She has resumed sexual intercourse without any problems.  She denies being depressed or any other concerning symptoms.  The patient does have a history of rheumatoid arthritis and hypothyroidism.  She did have a serious flare of her rheumatoid arthritis requiring her to go to the emergency room and she says that she needs refill of her tramadol medication and also it was requested that she is on daily prednisone 10 mg daily.  She is awaiting referral to the [facility] to continue management and surveillance of rheumatoid arthritis.  The patient does say that the pain is also very severe when she is having a flare requiring narcotic medications.  She is requesting having a prescription for Percocet to use in the event of severe pain.  The patient's last Pap smear in October 2011 showed low-grade squamous intraepithelial lesion.  She had a followup colposcopy  that was negative and is due for her next Pap smear in July.  The patient has no other concerns.  PHYSICAL EXAMINATION:  VITAL SIGNS:  Pulse 97, blood pressure 108/78, and height 5 feet and 6 inches. GENERAL:  No apparent distress. ABDOMEN:  Soft, nontender, and nondistended.  Well-healed infraumbilical incisions.  No organomegaly. EXTREMITIES:  No cyanosis, clubbing, or edema. PELVIC:  Normal external female genitalia.  Pink, well-rugated vagina. Small uterus.  Normal contour of the cervix.  Normal adnexa bilaterally. No tenderness on bimanual examination.  ASSESSMENT/PLAN:  The patient is a 30 year old, gravida 4, para 1-1-2-2 here today for a postpartum evaluation.  The patient is doing well.  She did have a tubal ligation for contraception and is breast-feeding.  She has no other concerns.  From a postpartum standpoint, the patient is going to have labs today to follow up her thyroid function tests.  In the meantime, we will continue her Levoxyl 125 mcg daily.  She will also be given a prescription for tramadol 50 mg every 6 hours as needed and prednisone 10 mg daily in addition to Percocet to use in the event of severe pain.  The patient is to return next month for further evaluation and  for her Pap smear.         ______________________________ Jaynie Collins, MD   UA/MEDQ  D:  03/04/2011  T:  03/05/2011  Job:  [id number]

## 2011-03-13 ENCOUNTER — Emergency Department (HOSPITAL_COMMUNITY)
Admission: EM | Admit: 2011-03-13 | Discharge: 2011-03-14 | Disposition: A | Discharge status: Home / Self Care | Payer: Medicaid Other | Attending: Emergency Medicine | Admitting: Emergency Medicine

## 2011-03-13 DIAGNOSIS — M25569 Pain in unspecified knee: Secondary | ICD-10-CM | POA: Insufficient documentation

## 2011-03-13 DIAGNOSIS — M79609 Pain in unspecified limb: Secondary | ICD-10-CM | POA: Insufficient documentation

## 2011-03-13 DIAGNOSIS — M129 Arthropathy, unspecified: Secondary | ICD-10-CM | POA: Insufficient documentation

## 2011-03-13 DIAGNOSIS — O99893 Other specified diseases and conditions complicating puerperium: Secondary | ICD-10-CM | POA: Insufficient documentation

## 2011-03-24 ENCOUNTER — Other Ambulatory Visit (INDEPENDENT_AMBULATORY_CARE_PROVIDER_SITE_OTHER): Payer: Medicaid Other | Admitting: Family Medicine

## 2011-03-24 DIAGNOSIS — M069 Rheumatoid arthritis, unspecified: Secondary | ICD-10-CM

## 2011-03-27 NOTE — Assessment & Plan Note (Signed)
NAMERAMA, SORCI NO.:  [id number]  MEDICAL RECORD NO.:  [id number]           PATIENT TYPE:  LOCATION:  [facility] at [facility]           FACILITY:  PHYSICIAN:  Tinnie Gens, MD        DATE OF BIRTH:  November 18, 1980  DATE OF SERVICE:  03/24/2011                                 CLINIC NOTE  CHIEF COMPLAINT:  Followup meds.  HISTORY OF PRESENT ILLNESS:  The patient is 30 year old gravida 4, para 2-0-2-2 who is 10 weeks post delivery.  She was supposed to come back for a repeat Pap as she had an  abnormal during pregnancy with colpo and no biopsy of low-grade SIL.  She is on her cycle today and does not want her pap smear done.  Additionally, the patient has a history of rheumatoid arthritis.  She has a followup appointment with Rheumatology scheduled for April 18, 2011.  She was last seen here on March 04, 2011, by Dr. Macon Large who gave her Percocet, tramadol, and prednisone.  She has had increasing flares of her rheumatoid arthritis since pregnancy has been over with.  We discussed the common causes for this.  Including the relatively immunosuppressed state of pregnancy.  Additionally, the patient has needed to increase her prednisone.  She is using 20-30 mg at times of flares and she uses Ultram or Percocet for pain.  She is advised not to use nonsteroidal while on prednisone.  The patient reports that when she is having an acute flare and the Ultram really does nothing for her pain.  She is out of Percocet and would likie a refill of this today.  Right now she has taken 30 or 40 mg of prednisone in the last 2 days for an acute flare and inflammation in her hands have gone down somewhat but her foot still aching quite a bit.  PHYSICAL EXAMINATION:  On exam, her vitals are as noted in the chart. She is a well-developed, well-nourished female in no acute distress. Her hands, have some chronic degenerative changes.  IMPRESSION: 1. Rheumatoid arthritis. 2. History of  abnormal Pap with bleeding today which needs followup.  PLAN:  I have refilled her prednisone at 20 mg and advised her to take half to one as needed daily and increase 1 to 2 as needed for acute flare.  With more pills so that she will have enough to get her through. Additionally, I have refilled her Percocet today, given her #45 with these.  She should have enough Ultram to get her through.  She will return in 2-3 weeks for repeat Pap smear when she is off her cycle.          ______________________________ Tinnie Gens, MD    TP/MEDQ  D:  03/24/2011  T:  03/25/2011  Job:  [id number]

## 2011-03-30 ENCOUNTER — Emergency Department (HOSPITAL_COMMUNITY): Payer: Medicaid Other

## 2011-03-30 ENCOUNTER — Emergency Department (HOSPITAL_COMMUNITY)
Admission: EM | Admit: 2011-03-30 | Discharge: 2011-03-30 | Disposition: A | Discharge status: Home / Self Care | Payer: Medicaid Other | Attending: Emergency Medicine | Admitting: Emergency Medicine

## 2011-03-30 DIAGNOSIS — W208XXA Other cause of strike by thrown, projected or falling object, initial encounter: Secondary | ICD-10-CM | POA: Insufficient documentation

## 2011-03-30 DIAGNOSIS — E039 Hypothyroidism, unspecified: Secondary | ICD-10-CM | POA: Insufficient documentation

## 2011-03-30 DIAGNOSIS — Y9367 Activity, basketball: Secondary | ICD-10-CM | POA: Insufficient documentation

## 2011-03-30 DIAGNOSIS — M79609 Pain in unspecified limb: Secondary | ICD-10-CM | POA: Insufficient documentation

## 2011-03-30 DIAGNOSIS — M25579 Pain in unspecified ankle and joints of unspecified foot: Secondary | ICD-10-CM | POA: Insufficient documentation

## 2011-03-30 DIAGNOSIS — M7989 Other specified soft tissue disorders: Secondary | ICD-10-CM | POA: Insufficient documentation

## 2011-03-30 DIAGNOSIS — M069 Rheumatoid arthritis, unspecified: Secondary | ICD-10-CM | POA: Insufficient documentation

## 2011-03-30 DIAGNOSIS — S8990XA Unspecified injury of unspecified lower leg, initial encounter: Secondary | ICD-10-CM | POA: Insufficient documentation

## 2011-03-30 DIAGNOSIS — Z79899 Other long term (current) drug therapy: Secondary | ICD-10-CM | POA: Insufficient documentation

## 2011-03-30 DIAGNOSIS — S99929A Unspecified injury of unspecified foot, initial encounter: Secondary | ICD-10-CM | POA: Insufficient documentation

## 2011-04-02 ENCOUNTER — Ambulatory Visit (INDEPENDENT_AMBULATORY_CARE_PROVIDER_SITE_OTHER): Payer: Medicaid Other | Admitting: Family Medicine

## 2011-04-02 DIAGNOSIS — Z1272 Encounter for screening for malignant neoplasm of vagina: Secondary | ICD-10-CM

## 2011-04-02 DIAGNOSIS — Z0142 Encounter for cervical smear to confirm findings of recent normal smear following initial abnormal smear: Secondary | ICD-10-CM

## 2011-04-03 NOTE — Assessment & Plan Note (Signed)
NAMEVERNIA, TEEM NO.:  [id number]  MEDICAL RECORD NO.:  [id number]           PATIENT TYPE:  LOCATION:  [facility]           FACILITY:  PHYSICIAN:  Tinnie Gens, MD        DATE OF BIRTH:  31-Oct-1980  DATE OF SERVICE:  04/02/2011                                 CLINIC NOTE  CHIEF COMPLAINT:  Repeat Pap.  HISTORY OF PRESENT ILLNESS:  The patient is a 30 year old gravida 4, para 2-0-2-2, who needs a repeat Pap smear.  She has been on prednisone for rheumatoid arthritis.  She has an appointment rheumatologist in mid to late July.  She has had symptoms of  prednisone overdose including weight gain, acne, round face,  buffalo hump.  She self DCed her prednisone which she has been off for approximately 3 days.  We did discuss the need for taper and symptoms associated with this.   Additionally, the patient's primary care doctor on diagnosing her with hypothyroidism started her on phentermine to help to treat weight loss.   She would like [place] to write for this again.  I am   uncomfortable writing for this medication and explained all this to the patient.  She is here today really for  repeat Pap after abnormal, she had a history of low-grade SIL with colpo during pregnancy.  On exam, round facies  with acne.  Buffalo hump  noted.  Weight gain of 10 pounds since a week.  Weight 206 today. GU:  NEFG, BUS is normal, vagina is pink and ruggated, cervix is parous without lesion, uterus is small, non-tender, adnexa are without mass or tenderness.  IMPRESSION: 1. Rheumatoid arthritis. 2. Some  symptoms of chronic steroid use. 3. History of abnormal Pap.  PLAN: 1. Repeat Pap today. 2. Follow up with Rheumatology.  Steroid taper 10 mg daily for a week,     then 5 mg daily for a week, and then discontinue.  I think she will     experience rapid weight loss once she is done     with her steroids, otherwise, she can follow up with her primary     care physician if  she is really interested in  phentermine.          ______________________________ Tinnie Gens, MD    TP/MEDQ  D:  04/02/2011  T:  04/03/2011  Job:  [id number]

## 2011-04-21 ENCOUNTER — Telehealth: Payer: Self-pay

## 2011-04-21 DIAGNOSIS — M069 Rheumatoid arthritis, unspecified: Secondary | ICD-10-CM

## 2011-04-21 MED ORDER — TRAMADOL HCL 50 MG PO TABS: 50.0000 mg | ORAL_TABLET | Freq: Four times a day (QID) | ORAL | Status: DC | PRN

## 2011-04-21 NOTE — Telephone Encounter (Signed)
Patient has her appointment with her Rheumatologist this Thursday.  She is supposed to begin preventative therapy with him but was told it could take up the 3 weeks for the new med to take effect.  She is requesting a refill of her Percocet.  She was last given this #45 on 03/23/2011, but she has had a couple of flares since the prednisone taper.

## 2011-04-21 NOTE — Telephone Encounter (Signed)
PATIENT NEEDS A RX FOR PERCOCET FOR HER ARTHRITIS AND ALSO NEEDS [place] TO CALL AND CHANGE QUANITY FOR HER TRI MIDOL  TO 60 INSTEAD OF 30  A MONTH. [facility] IN [place].

## 2011-04-28 ENCOUNTER — Other Ambulatory Visit: Payer: Self-pay | Admitting: Family Medicine

## 2011-04-28 DIAGNOSIS — M069 Rheumatoid arthritis, unspecified: Secondary | ICD-10-CM

## 2011-04-28 MED ORDER — OXYCODONE-ACETAMINOPHEN 5-325 MG PO TABS: 1.0000 | ORAL_TABLET | ORAL | Status: AC | PRN

## 2011-04-28 NOTE — Telephone Encounter (Signed)
Pt. Has seen Rheum and they have started her on Methotrexate.  She has been on it for one week.  This is her last pain rx from this office.  Patient has been told this.

## 2011-05-04 ENCOUNTER — Telehealth: Payer: Self-pay | Admitting: *Deleted

## 2011-05-04 DIAGNOSIS — M069 Rheumatoid arthritis, unspecified: Secondary | ICD-10-CM

## 2011-05-04 MED ORDER — TRAMADOL HCL 50 MG PO TABS: 50.0000 mg | ORAL_TABLET | ORAL | Status: DC | PRN

## 2011-05-04 NOTE — Telephone Encounter (Signed)
Patient had refills on Tramadol 50mg  but she is needing to take it every 4 hours.  She is asking if we can change her prescription to read every 4 hours instead of 6 hours and to increase amount to #60.  I spoke with Dr. Macon Large since she wrote the original prescription. She said we could give one refill of the Tramadol 50 at this dose.  From now on she needs to follow up with rheumatologist.  Patient told us that she has established herself with a Rheumatologist, we have tried to contact her to find out which Dr. She is seeing so that she can begin to have her arthritis medications followed by them.

## 2011-05-05 ENCOUNTER — Emergency Department (HOSPITAL_COMMUNITY)
Admission: EM | Admit: 2011-05-05 | Discharge: 2011-05-05 | Disposition: A | Discharge status: Home / Self Care | Payer: Self-pay | Attending: Emergency Medicine | Admitting: Emergency Medicine

## 2011-05-05 DIAGNOSIS — M2550 Pain in unspecified joint: Secondary | ICD-10-CM | POA: Insufficient documentation

## 2011-05-05 DIAGNOSIS — M129 Arthropathy, unspecified: Secondary | ICD-10-CM | POA: Insufficient documentation

## 2011-05-05 DIAGNOSIS — Z79899 Other long term (current) drug therapy: Secondary | ICD-10-CM | POA: Insufficient documentation

## 2011-05-17 ENCOUNTER — Telehealth: Payer: Self-pay | Admitting: Obstetrics & Gynecology

## 2011-05-30 ENCOUNTER — Other Ambulatory Visit: Payer: Self-pay | Admitting: Obstetrics & Gynecology

## 2011-06-21 ENCOUNTER — Other Ambulatory Visit: Payer: Self-pay | Admitting: Obstetrics & Gynecology

## 2011-06-22 LAB — CBC
Hemoglobin: 12.3
MCHC: 33.6
MCV: 75.8 — ABNORMAL LOW
RBC: 4.82
WBC: 7.6

## 2011-06-22 LAB — DIFFERENTIAL
Basophils Absolute: 0
Eosinophils Absolute: 0.1
Lymphs Abs: 2
Monocytes Absolute: 0.5
Monocytes Relative: 7
Neutro Abs: 5

## 2011-06-25 LAB — URINE CULTURE

## 2011-06-25 LAB — CBC
HCT: 35.2 — ABNORMAL LOW
Hemoglobin: 11.5 — ABNORMAL LOW
MCHC: 32.6
RDW: 16 — ABNORMAL HIGH

## 2011-06-25 LAB — URINALYSIS, ROUTINE W REFLEX MICROSCOPIC
Nitrite: NEGATIVE
Protein, ur: 30 — AB
Urobilinogen, UA: 1

## 2011-06-25 LAB — DIFFERENTIAL
Basophils Absolute: 0.1
Eosinophils Relative: 2
Lymphocytes Relative: 23
Monocytes Absolute: 0.6

## 2011-06-25 LAB — TYPE AND SCREEN: Antibody Screen: NEGATIVE

## 2011-06-25 LAB — ABO/RH: ABO/RH(D): O POS

## 2011-06-25 LAB — POCT PREGNANCY, URINE: Preg Test, Ur: NEGATIVE

## 2011-06-25 LAB — URINE MICROSCOPIC-ADD ON

## 2011-07-06 ENCOUNTER — Other Ambulatory Visit: Payer: Self-pay | Admitting: Obstetrics & Gynecology

## 2011-07-27 ENCOUNTER — Other Ambulatory Visit: Payer: Self-pay | Admitting: Obstetrics & Gynecology

## 2011-08-04 ENCOUNTER — Encounter: Payer: Self-pay | Admitting: *Deleted

## 2011-08-04 ENCOUNTER — Emergency Department (HOSPITAL_COMMUNITY)
Admission: EM | Admit: 2011-08-04 | Discharge: 2011-08-04 | Disposition: A | Discharge status: Home / Self Care | Payer: Self-pay | Attending: Emergency Medicine | Admitting: Emergency Medicine

## 2011-08-04 DIAGNOSIS — R21 Rash and other nonspecific skin eruption: Secondary | ICD-10-CM | POA: Insufficient documentation

## 2011-08-04 DIAGNOSIS — M069 Rheumatoid arthritis, unspecified: Secondary | ICD-10-CM | POA: Insufficient documentation

## 2011-08-04 DIAGNOSIS — E079 Disorder of thyroid, unspecified: Secondary | ICD-10-CM | POA: Insufficient documentation

## 2011-08-04 HISTORY — DX: Disorder of thyroid, unspecified: E07.9

## 2011-08-04 MED ORDER — PREDNISONE 10 MG PO TABS: 20.0000 mg | ORAL_TABLET | Freq: Every day | ORAL | Status: AC

## 2011-08-04 MED ORDER — OXYCODONE-ACETAMINOPHEN 5-325 MG PO TABS: 2.0000 | ORAL_TABLET | ORAL | Status: AC | PRN

## 2011-08-04 NOTE — ED Notes (Signed)
Pt in c/o flair in rheumatoid arthritis, states she has been out of her medication recently and is unable to get it at this time, c/o pain and swelling in bilateral wrists and fingers, symptoms started Saturday, pt states she has some prednisone and she has been taking intermittent doses of that but has had no relief

## 2011-08-04 NOTE — ED Provider Notes (Signed)
History     CSN: [id number] Arrival date & time: 08/04/2011  4:35 AM   First MD Initiated Contact with Patient 08/04/11 [phone]      Chief Complaint  Patient presents with  . Rheumatoid Arthritis    (Consider location/radiation/quality/duration/timing/severity/associated sxs/prior treatment) Patient is a 30 y.o. female presenting with hand pain.  Hand Pain This is a recurrent problem. The current episode started more than 2 days ago. The problem occurs constantly. The problem has been gradually worsening (It feels like a severe achiness). Pertinent negatives include no chest pain, no abdominal pain, no headaches and no shortness of breath. Exacerbated by: Movements of hands  and wrists. The symptoms are relieved by nothing. She has tried acetaminophen (And ibuprofen) for the symptoms. The treatment provided no relief.    Past Medical History  Diagnosis Date  . Arthritis   . Thyroid disease     History reviewed. No pertinent past surgical history.  History reviewed. No pertinent family history.  History  Substance Use Topics  . Smoking status: Never Smoker   . Smokeless tobacco: Not on file  . Alcohol Use: No    OB History    Grav Para Term Preterm Abortions TAB SAB Ect Mult Living                  Review of Systems  Respiratory: Negative for shortness of breath.   Cardiovascular: Negative for chest pain.  Gastrointestinal: Negative for abdominal pain.  Neurological: Negative for headaches.  All other systems reviewed and are negative.    Allergies  Penicillins  Home Medications   Current Outpatient Rx  Name Route Sig Dispense Refill  . LEVOTHYROXINE SODIUM 125 MCG PO TABS Oral Take 125 mcg by mouth daily.      . TRAMADOL HCL 50 MG PO TABS  TAKE 1 TABLET BY MOUTH EVERY 4 HOURS AS NEEDED FOR PAIN 60 tablet 0    BP 111/75  Pulse 94  Temp(Src) 98.7 F (37.1 C) (Oral)  Resp 18  SpO2 99%  LMP 07/05/2011  Physical Exam  Constitutional: She appears  well-developed and well-nourished.  HENT:  Head: Normocephalic and atraumatic.  Eyes: EOM are normal. Pupils are equal, round, and reactive to light.  Neck: Normal range of motion. Neck supple.  Cardiovascular: Normal rate and regular rhythm.   Pulmonary/Chest: Effort normal and breath sounds normal.  Musculoskeletal: She exhibits tenderness. She exhibits no edema.       The proximal interphalangeal joints of both hands, fingers 2 through 5 are mildly swollen. There is tenderness and swelling of the left dorsal wrist. There is decreased range of motion of the left wrist. Due to pain.  Neurological: She is alert.  Skin: Rash noted.       There is an indistinct red rash of the right lower neck  Psychiatric: Her behavior is normal. Judgment and thought content normal.       She is anxious    ED Course  Procedures (including critical care time)  Labs Reviewed - No data to display No results found.   No diagnosis found.    MDM  Reurrent flare of arthralgia, likely due to rheumatoid arthritis. Patient currently has no access to ongoing rheumatologic treatments. No PCP. She will be given a short term treatment course.        Flint Melter, MD 08/04/11 [phone]

## 2011-08-10 ENCOUNTER — Other Ambulatory Visit: Payer: Self-pay | Admitting: Obstetrics & Gynecology

## 2011-08-12 ENCOUNTER — Other Ambulatory Visit: Payer: Self-pay | Admitting: *Deleted

## 2011-08-12 MED ORDER — LEVOTHYROXINE SODIUM 125 MCG PO TABS: 125.0000 ug | ORAL_TABLET | Freq: Every day | ORAL | Status: DC

## 2011-08-12 NOTE — Telephone Encounter (Signed)
Patient needs refill of her thyroid medication.  She has not taken it for a month and needs to restart.  I advised her to restart the same dose and come in in three weeks for tsh check to determine if a change needs to be made.

## 2011-08-22 ENCOUNTER — Other Ambulatory Visit: Payer: Self-pay | Admitting: Obstetrics & Gynecology

## 2011-09-04 ENCOUNTER — Other Ambulatory Visit: Payer: Self-pay

## 2011-09-04 ENCOUNTER — Other Ambulatory Visit: Payer: Self-pay | Admitting: Obstetrics & Gynecology

## 2011-09-05 ENCOUNTER — Encounter (HOSPITAL_COMMUNITY): Payer: Self-pay | Admitting: *Deleted

## 2011-09-05 ENCOUNTER — Emergency Department (HOSPITAL_COMMUNITY)
Admission: EM | Admit: 2011-09-05 | Discharge: 2011-09-05 | Disposition: A | Discharge status: Home / Self Care | Payer: Self-pay | Attending: Emergency Medicine | Admitting: Emergency Medicine

## 2011-09-05 ENCOUNTER — Other Ambulatory Visit: Payer: Self-pay

## 2011-09-05 DIAGNOSIS — F419 Anxiety disorder, unspecified: Secondary | ICD-10-CM

## 2011-09-05 DIAGNOSIS — F411 Generalized anxiety disorder: Secondary | ICD-10-CM | POA: Insufficient documentation

## 2011-09-05 DIAGNOSIS — R002 Palpitations: Secondary | ICD-10-CM | POA: Insufficient documentation

## 2011-09-05 DIAGNOSIS — R110 Nausea: Secondary | ICD-10-CM | POA: Insufficient documentation

## 2011-09-05 LAB — URINALYSIS, ROUTINE W REFLEX MICROSCOPIC
Bilirubin Urine: NEGATIVE
Hgb urine dipstick: NEGATIVE
Protein, ur: NEGATIVE mg/dL
Urobilinogen, UA: 1 mg/dL (ref 0.0–1.0)

## 2011-09-05 LAB — URINE MICROSCOPIC-ADD ON

## 2011-09-05 MED ORDER — LORAZEPAM 1 MG PO TABS
1.0000 mg | ORAL_TABLET | Freq: Once | ORAL | Status: AC
  Administered 2011-09-05: 1 mg via ORAL
  Filled 2011-09-05: qty 1

## 2011-09-05 MED ORDER — LORAZEPAM 0.5 MG PO TABS
0.5000 mg | ORAL_TABLET | Freq: Once | ORAL | Status: AC
  Administered 2011-09-05: 0.5 mg via ORAL
  Filled 2011-09-05: qty 1
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
## 2011-09-05 NOTE — ED Notes (Signed)
Patient aware a urine sample is needed and said is unable at this time, but will try. Pt also informed she must have a ride home after medication. Pt aware and said she will make arrangements
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
## 2011-09-07 ENCOUNTER — Other Ambulatory Visit: Payer: Self-pay | Admitting: *Deleted

## 2011-09-07 DIAGNOSIS — E039 Hypothyroidism, unspecified: Secondary | ICD-10-CM

## 2011-09-07 DIAGNOSIS — M069 Rheumatoid arthritis, unspecified: Secondary | ICD-10-CM

## 2011-09-07 MED ORDER — TRAMADOL HCL 50 MG PO TABS: 50.0000 mg | ORAL_TABLET | Freq: Four times a day (QID) | ORAL | Status: DC | PRN

## 2011-09-08 LAB — TSH: TSH: 0.532 u[IU]/mL (ref 0.350–4.500)

## 2011-09-09 ENCOUNTER — Ambulatory Visit: Payer: Self-pay | Admitting: Obstetrics & Gynecology

## 2011-09-17 ENCOUNTER — Other Ambulatory Visit: Payer: Self-pay | Admitting: Obstetrics & Gynecology

## 2011-09-24 ENCOUNTER — Emergency Department (INDEPENDENT_AMBULATORY_CARE_PROVIDER_SITE_OTHER)
Admission: EM | Admit: 2011-09-24 | Discharge: 2011-09-24 | Disposition: A | Discharge status: Home / Self Care | Payer: Self-pay | Source: Home / Self Care

## 2011-09-24 ENCOUNTER — Encounter (HOSPITAL_COMMUNITY): Payer: Self-pay | Admitting: Emergency Medicine

## 2011-09-24 ENCOUNTER — Emergency Department (INDEPENDENT_AMBULATORY_CARE_PROVIDER_SITE_OTHER): Payer: Self-pay

## 2011-09-24 DIAGNOSIS — M25569 Pain in unspecified knee: Secondary | ICD-10-CM

## 2011-09-24 DIAGNOSIS — M79609 Pain in unspecified limb: Secondary | ICD-10-CM

## 2011-09-24 DIAGNOSIS — M79671 Pain in right foot: Secondary | ICD-10-CM

## 2011-09-24 HISTORY — DX: Rheumatoid arthritis, unspecified: M06.9

## 2011-09-24 MED ORDER — HYDROCODONE-ACETAMINOPHEN 5-325 MG PO TABS: 2.0000 | ORAL_TABLET | ORAL | Status: DC | PRN

## 2011-09-24 MED ORDER — HYDROCODONE-ACETAMINOPHEN 5-325 MG PO TABS: 1.0000 | ORAL_TABLET | Freq: Four times a day (QID) | ORAL | Status: AC | PRN

## 2011-09-24 NOTE — ED Notes (Signed)
[facility]

## 2011-09-24 NOTE — ED Notes (Signed)
Provided warm blankets and repositioned exam table

## 2011-09-24 NOTE — ED Provider Notes (Signed)
History     CSN: [id number]  Arrival date & time 09/24/11  0865   None     Chief Complaint  Patient presents with  . Knee Injury    (Consider location/radiation/quality/duration/timing/severity/associated sxs/prior treatment) HPI Comments: Pt states she fell onto her Rt knee Christmas night. She initially had pain at the time of the fall but then was OK. The next morning was stiff and sore, and that afternoon noticed her foot was sore, swollen and began looking bruised.  Then her knee locked up and has been painful to straighten since. She feels that she cannot fully extender her knee. She has been taking Tylenol and Ibuprofen for discomfort.    Past Medical History  Diagnosis Date  . Thyroid disease   . Rheumatoid arthritis     Past Surgical History  Procedure Date  . Tubal ligation     History reviewed. No pertinent family history.  History  Substance Use Topics  . Smoking status: Never Smoker   . Smokeless tobacco: Not on file  . Alcohol Use: No    OB History    Grav Para Term Preterm Abortions TAB SAB Ect Mult Living                  Review of Systems  Constitutional: Negative for fever and chills.  Musculoskeletal: Negative for back pain and joint swelling.  Skin: Negative for color change and wound.    Allergies  Penicillins  Home Medications   Current Outpatient Rx  Name Route Sig Dispense Refill  . LEVOTHYROXINE SODIUM 125 MCG PO TABS Oral Take 1 tablet (125 mcg total) by mouth daily. 30 tablet 1  . HYDROCODONE-ACETAMINOPHEN 5-325 MG PO TABS Oral Take 2 tablets by mouth every 4 (four) hours as needed for pain. 10 tablet 0  . PREDNISONE 10 MG PO TABS Oral Take 10 mg by mouth daily.      . TRAMADOL HCL 50 MG PO TABS Oral Take 1 tablet (50 mg total) by mouth every 6 (six) hours as needed for pain. May take two tablets at once if needed. Maximum dose= 8 tablets per day 60 tablet 12  . TRAMADOL HCL 50 MG PO TABS  TAKE 1 TABLET BY MOUTH EVERY 4 HOURS AS  NEEDED FOR PAIN 60 tablet 0    PT NEEDS THIS MEDICATION AS SOON AS POSSIBLE    BP 139/87  Pulse 95  Temp(Src) 98.4 F (36.9 C) (Oral)  Resp 18  SpO2 100%  LMP 09/02/2011  Physical Exam  Nursing note and vitals reviewed. Constitutional: She appears well-developed and well-nourished. No distress.  HENT:  Head: Normocephalic and atraumatic.  Cardiovascular: Normal rate, regular rhythm and normal heart sounds.   Pulmonary/Chest: Effort normal and breath sounds normal. No respiratory distress.  Musculoskeletal:       Right knee: She exhibits decreased range of motion. She exhibits no swelling, no effusion, no ecchymosis, no laceration, no erythema, normal alignment, no LCL laxity, normal patellar mobility, no bony tenderness and no MCL laxity. tenderness found. Lateral joint line tenderness noted. No medial joint line, no MCL, no LCL and no patellar tendon tenderness noted.       Right foot: She exhibits tenderness (TTP across dorsum of foot) and swelling (mild dorsum of foot). She exhibits normal range of motion, no bony tenderness, normal capillary refill, no deformity and no laceration.       Feet:  Neurological: She is alert.  Skin: Skin is warm and dry.  Psychiatric:  She has a normal mood and affect.    ED Course  Procedures (including critical care time)  Labs Reviewed - No data to display Dg Foot Complete Right  09/24/2011  *RADIOLOGY REPORT*  Clinical Data: Pain and swelling along the dorsum of the foot.  RIGHT FOOT COMPLETE - 3+ VIEW  Comparison: 03/30/2011.  Findings: There is prominent periosteal reaction about the midshaft of the second metatarsal, new from 03/30/2011.   No evidence of an acute fracture.  Soft tissue swelling is seen along the dorsal aspect of the metatarsals.  IMPRESSION: Interval development of prominent periosteal reaction about the second metatarsal shaft, with overlying soft tissue swelling.  A chronic stress fracture could have this appearance.   Original Report Authenticated By: Reyes Ivan, M.D.     1. Knee meniscus pain   2. Foot pain, right       MDM  Probable Rt knee lateral meniscus tear. Rt foot 2nd metatarsal stress fx on xray. Advised pt chronic and not due to recent injury. Knee sleeve used instead of knee imobilizer as pt unable to extend knee.         Melody [place], [place] 10/12/11 [phone]

## 2011-09-24 NOTE — ED Notes (Signed)
Right knee and foot pain, "went down on knee funny"" but felt fine immediately afterwards".  Since then has noticed increased pain: foot initially, today includes knee, feels like pop inside when attempting to straighten leg.  No visible injury, appears swollen. Patient reports significant pain

## 2011-09-24 NOTE — ED Notes (Signed)
Nando, emt applying knee brace 

## 2011-09-28 ENCOUNTER — Telehealth (HOSPITAL_COMMUNITY): Payer: Self-pay | Admitting: *Deleted

## 2011-10-08 ENCOUNTER — Other Ambulatory Visit: Payer: Self-pay | Admitting: Obstetrics & Gynecology

## 2011-10-13 NOTE — ED Provider Notes (Signed)
Medical screening examination/treatment/procedure(s) were performed by non-physician practitioner and as supervising physician I was immediately available for consultation/collaboration.  LANEY,RONNIE   Ronnie Laney, MD 10/13/11 1215 

## 2011-10-17 ENCOUNTER — Other Ambulatory Visit: Payer: Self-pay | Admitting: Obstetrics & Gynecology

## 2011-10-21 ENCOUNTER — Other Ambulatory Visit: Payer: Self-pay | Admitting: Family Medicine

## 2011-10-21 DIAGNOSIS — E039 Hypothyroidism, unspecified: Secondary | ICD-10-CM

## 2011-10-21 MED ORDER — LEVOTHYROXINE SODIUM 125 MCG PO TABS: 125.0000 ug | ORAL_TABLET | Freq: Every day | ORAL | Status: DC

## 2011-11-02 ENCOUNTER — Other Ambulatory Visit: Payer: Self-pay | Admitting: Obstetrics & Gynecology

## 2011-11-16 ENCOUNTER — Encounter: Payer: Self-pay | Admitting: Obstetrics & Gynecology

## 2011-11-16 ENCOUNTER — Ambulatory Visit (INDEPENDENT_AMBULATORY_CARE_PROVIDER_SITE_OTHER): Payer: Self-pay | Admitting: Obstetrics & Gynecology

## 2011-11-16 DIAGNOSIS — F419 Anxiety disorder, unspecified: Secondary | ICD-10-CM | POA: Insufficient documentation

## 2011-11-16 DIAGNOSIS — IMO0002 Reserved for concepts with insufficient information to code with codable children: Secondary | ICD-10-CM | POA: Insufficient documentation

## 2011-11-16 DIAGNOSIS — M069 Rheumatoid arthritis, unspecified: Secondary | ICD-10-CM | POA: Insufficient documentation

## 2011-11-16 DIAGNOSIS — R87612 Low grade squamous intraepithelial lesion on cytologic smear of cervix (LGSIL): Secondary | ICD-10-CM

## 2011-11-16 DIAGNOSIS — F329 Major depressive disorder, single episode, unspecified: Secondary | ICD-10-CM | POA: Insufficient documentation

## 2011-11-16 DIAGNOSIS — F411 Generalized anxiety disorder: Secondary | ICD-10-CM

## 2011-11-16 MED ORDER — ESCITALOPRAM OXALATE 10 MG PO TABS: 10.0000 mg | ORAL_TABLET | Freq: Every day | ORAL | Status: DC

## 2011-11-16 MED ORDER — OXYCODONE-ACETAMINOPHEN 5-325 MG PO TABS: 1.0000 | ORAL_TABLET | Freq: Four times a day (QID) | ORAL | Status: AC | PRN

## 2011-11-16 NOTE — Patient Instructions (Signed)
Anxiety and Panic Attacks Your caregiver has informed you that you are having an anxiety or panic attack. There may be many forms of this. Most of the time these attacks come suddenly and without warning. They come at any time of day, including periods of sleep, and at any time of life. They may be strong and unexplained. Although panic attacks are very scary, they are physically harmless. Sometimes the cause of your anxiety is not known. Anxiety is a protective mechanism of the body in its fight or flight mechanism. Most of these perceived danger situations are actually nonphysical situations (such as anxiety over losing a job). CAUSES  The causes of an anxiety or panic attack are many. Panic attacks may occur in otherwise healthy people given a certain set of circumstances. There may be a genetic cause for panic attacks. Some medications may also have anxiety as a side effect. SYMPTOMS  Some of the most common feelings are:  Intense terror.   Dizziness, feeling faint.   Hot and cold flashes.   Fear of going crazy.   Feelings that nothing is real.   Sweating.   Shaking.   Chest pain or a fast heartbeat (palpitations).   Smothering, choking sensations.   Feelings of impending doom and that death is near.   Tingling of extremities, this may be from over-breathing.   Altered reality (derealization).   Being detached from yourself (depersonalization).  Several symptoms can be present to make up anxiety or panic attacks. DIAGNOSIS  The evaluation by your caregiver will depend on the type of symptoms you are experiencing. The diagnosis of anxiety or panic attack is made when no physical illness can be determined to be a cause of the symptoms. TREATMENT  Treatment to prevent anxiety and panic attacks may include:  Avoidance of circumstances that cause anxiety.   Reassurance and relaxation.   Regular exercise.   Relaxation therapies, such as yoga.   Psychotherapy with a  psychiatrist or therapist.   Avoidance of caffeine, alcohol and illegal drugs.   Prescribed medication.  SEEK IMMEDIATE MEDICAL CARE IF:   You experience panic attack symptoms that are different than your usual symptoms.   You have any worsening or concerning symptoms.  Document Released: 09/14/2005 Document Revised: 05/27/2011 Document Reviewed: 01/16/2010 ExitCare Patient Information 2012 [employer].Depression, Adolescent and Adult Depression is a true and treatable medical condition. In general there are two kinds of depression:  Depression we all experience in some form. For example depression from the death of a loved one, financial distress or natural disasters will trigger or increase depression.   Clinical depression, on the other hand, appears without an apparent cause or reason. This depression is a disease. Depression may be caused by chemical imbalance in the body and brain or may come as a response to a physical illness. Alcohol and other drugs can cause depression.  DIAGNOSIS  The diagnosis of depression is usually based upon symptoms and medical history. TREATMENT  Treatments for depression fall into three categories. These are:  Drug therapy. There are many medicines that treat depression. Responses may vary and sometimes trial and error is necessary to determine the best medicines and dosage for a particular patient.   Psychotherapy, also called talking treatments, helps people resolve their problems by looking at them from a different point of view and by giving people insight into their own personal makeup. Traditional psychotherapy looks at a childhood source of a problem. Other psychotherapy will look at current conflicts and   move toward solving those. If the cause of depression is drug use, counseling is available to help abstain. In time the depression will usually improve. If there were underlying causes for the chemical use, they can be addressed.   ECT  (electroconvulsive therapy) or shock treatment is not as commonly used today. It is a very effective treatment for severe suicidal depression. During ECT electrical impulses are applied to the head. These impulses cause a generalized seizure. It can be effective but causes a loss of memory for recent events. Sometimes this loss of memory may include the last several months.  Treat all depression or suicide threats as serious. Obtain professional help. Do not wait to see if serious depression will get better over time without help. Seek help for yourself or those around you. In the [place] the number to the National Suicide Help Lines With 24 Hour Help Are: 1-800-SUICIDE 1-800-784-2433 Document Released: 09/11/2000 Document Revised: 05/27/2011 Document Reviewed: 05/02/2008 ExitCare Patient Information 2012 [employer]. 

## 2011-11-16 NOTE — Progress Notes (Signed)
Patient has RA , MDD and anxiety.  Unable to see Rheumatologist or Psychiatrist due to  Cost.  She is pleading that we help her with prescribing needed medications.  We have prescribed her RA meds and depression medications during pregnancy.  Emphasized that I am just an OB/GYN specialist, and she understands this.  She is requesting Percocet for severe RA pain, Tramadol (which was already refilled), and an antidepressant that can also treat anxiety/panic attacks.  After looking on Uptodate and Epocrates, Lexapro was a good option and this was discussed with patient. Common side effects discussed with patient.  Lexapro was e-prescribed, also printed out online coupon to help her with cost.  Percocet Rx given.  Patient is working on getting Medicaid, and will follow up with these specialists.  As for GYN issues, she had a LGSIL pap in 03/2011 and did not show up for colposcopy.  A free cervical cancer screening session is occuring here next month, patient was given information for this and strongly encouraged to attend. For her cervical dysplasia, patient needs pap smears every six months until she has two consecutive normal pap smears, then she can resume annual screening. If any pap smear is abnormal, she will need repeat colposcopy and appropriate evaluation.  Dana Elliott, M.D. 11/16/2011 4:02 PM

## 2011-12-07 ENCOUNTER — Telehealth: Payer: Self-pay | Admitting: *Deleted

## 2011-12-07 DIAGNOSIS — F329 Major depressive disorder, single episode, unspecified: Secondary | ICD-10-CM

## 2011-12-07 DIAGNOSIS — F419 Anxiety disorder, unspecified: Secondary | ICD-10-CM

## 2011-12-07 MED ORDER — ESCITALOPRAM OXALATE 10 MG PO TABS: ORAL_TABLET | ORAL | Status: DC

## 2011-12-07 NOTE — Telephone Encounter (Signed)
Patient has increased her dose to 15 mg and needs to have rx called in to adjust dose.

## 2011-12-22 ENCOUNTER — Other Ambulatory Visit: Payer: Self-pay

## 2011-12-29 ENCOUNTER — Other Ambulatory Visit: Payer: Self-pay | Admitting: Obstetrics & Gynecology

## 2012-01-03 IMAGING — US US OB COMP LESS 14 WK
1 series · 14 of 28 positions shown · non-contrast
Comparison: none

OBSTETRICAL ULTRASOUND:
 This ultrasound exam was performed in the [HOSPITAL] Ultrasound Department.  The OB US report was generated in the AS system, and faxed to the ordering physician.  This report is also available in [HOSPITAL]?s Access[employer]ware and in [REDACTED] PACS.

[Series 1: us ob comp less 14 wks · 0.16mm/px · 14 of 52 slices shown]
[im 2/52]
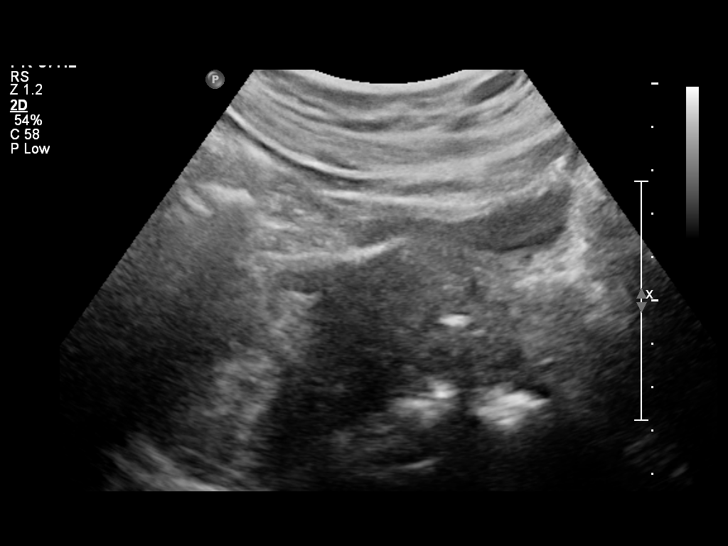
[im 6/52]
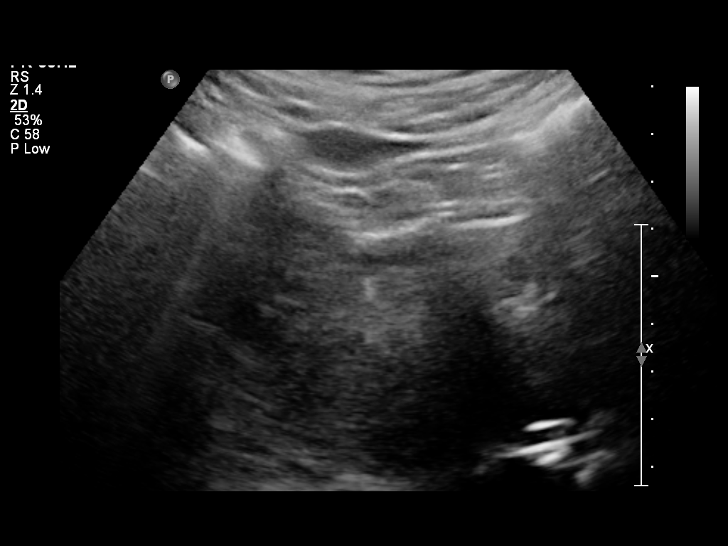
[im 10/52]
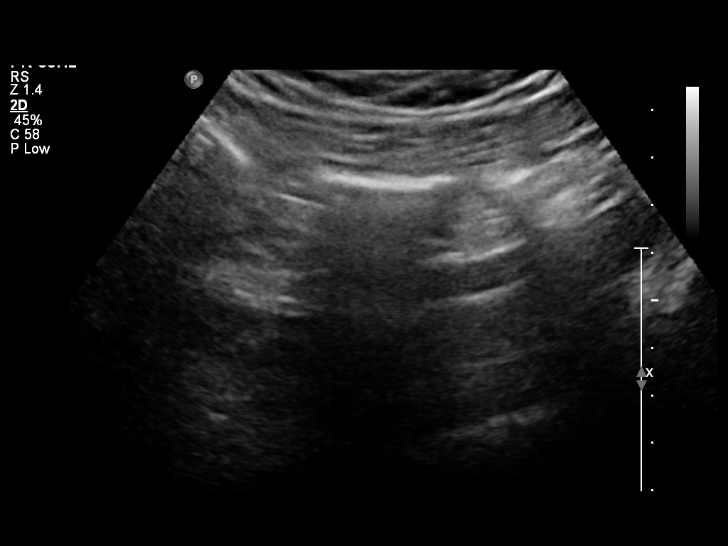
[im 14/52]
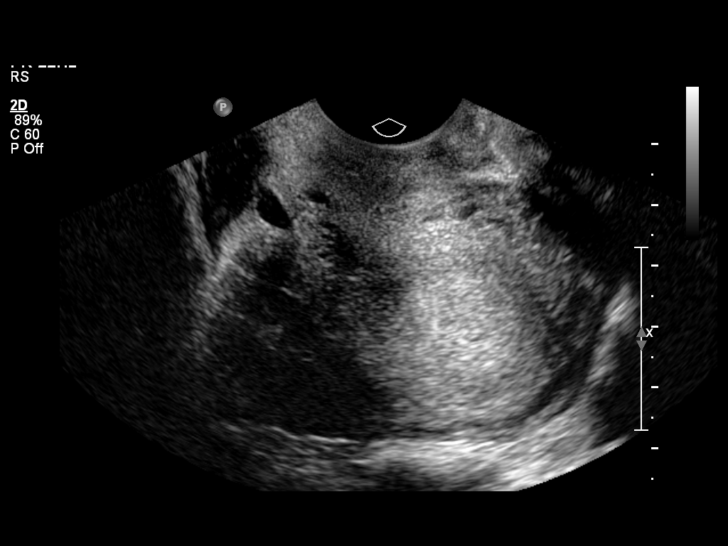
[im 18/52]
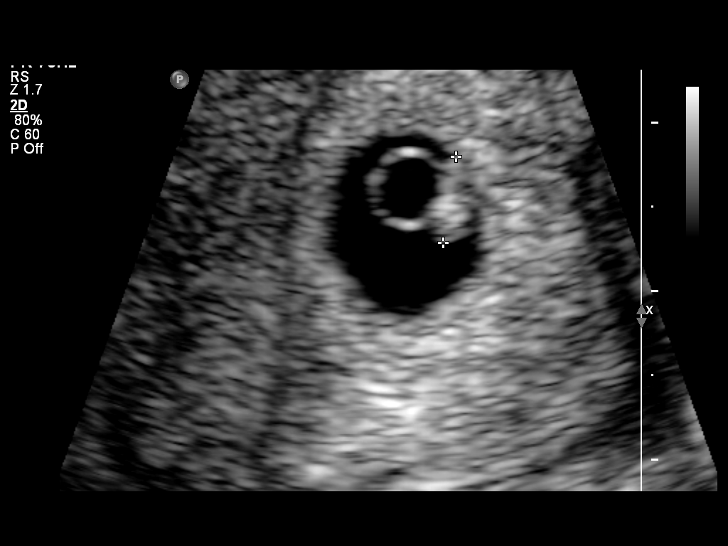
[im 21/52]
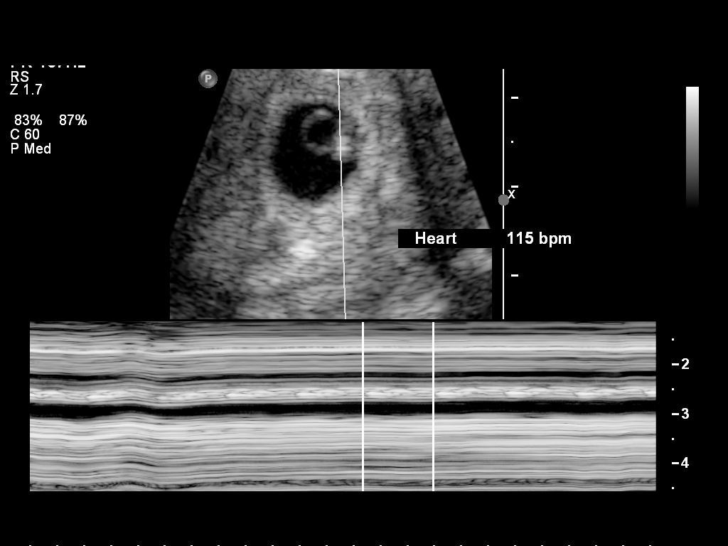
[im 25/52]
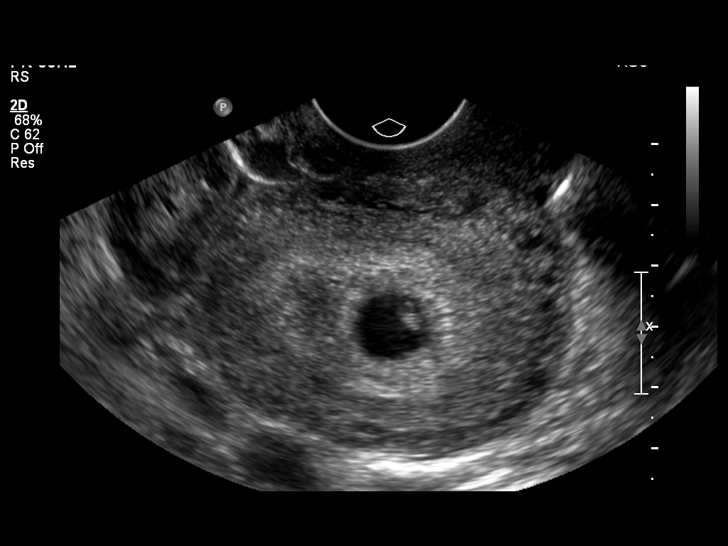
[im 29/52]
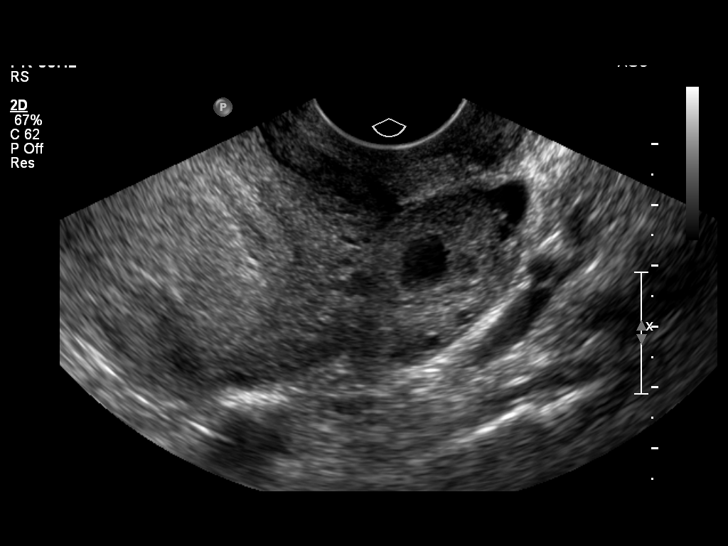
[im 33/52]
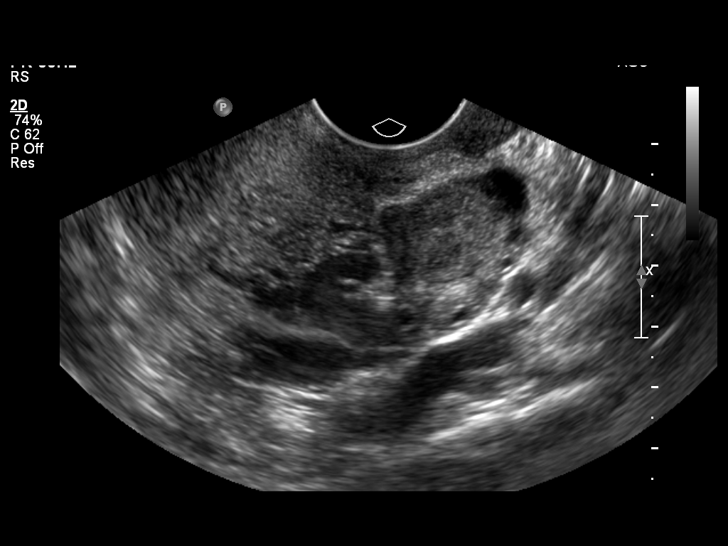
[im 36/52]
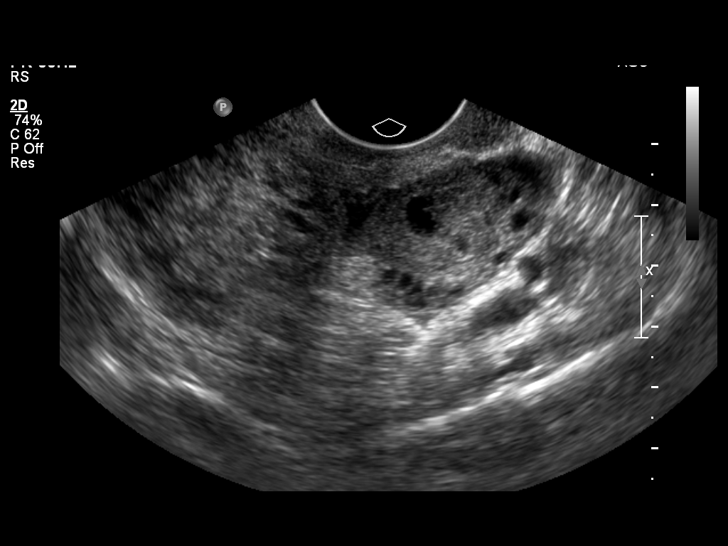
[im 40/52]
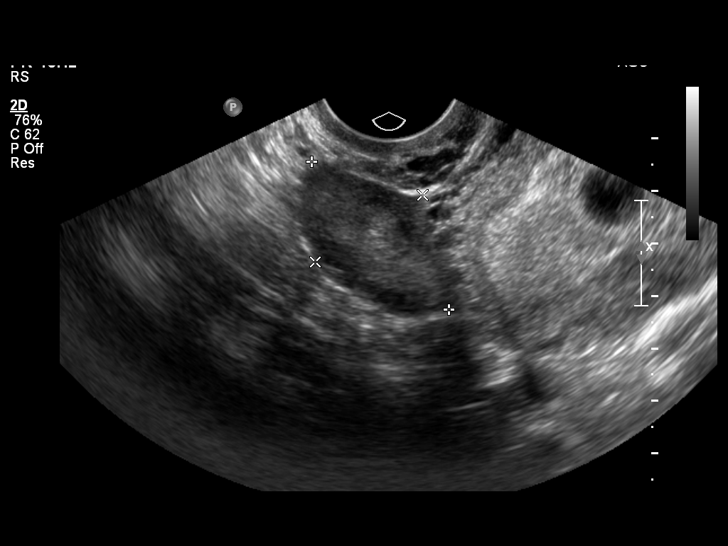
[im 44/52]
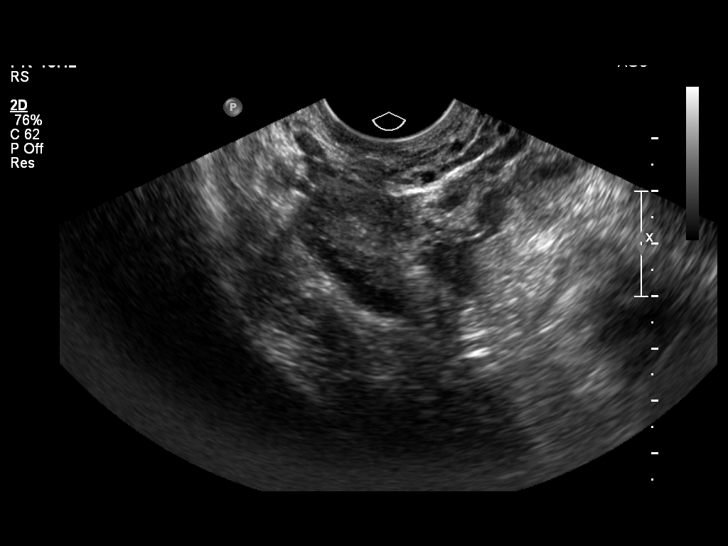
[im 48/52]
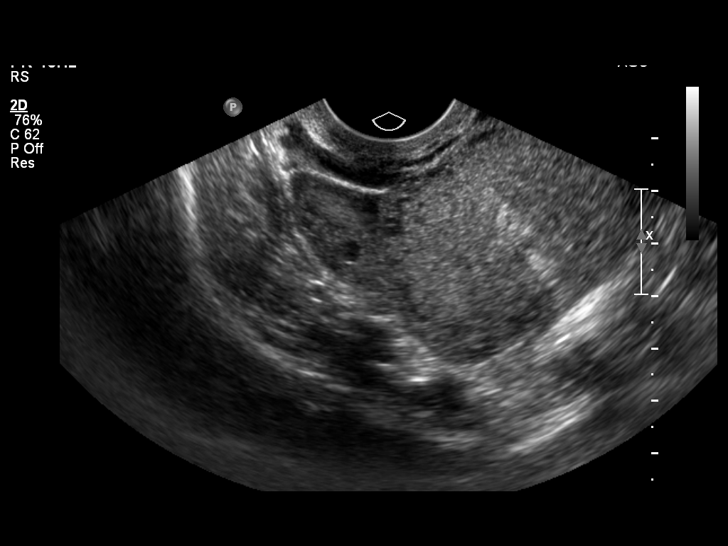
[im 52/52]
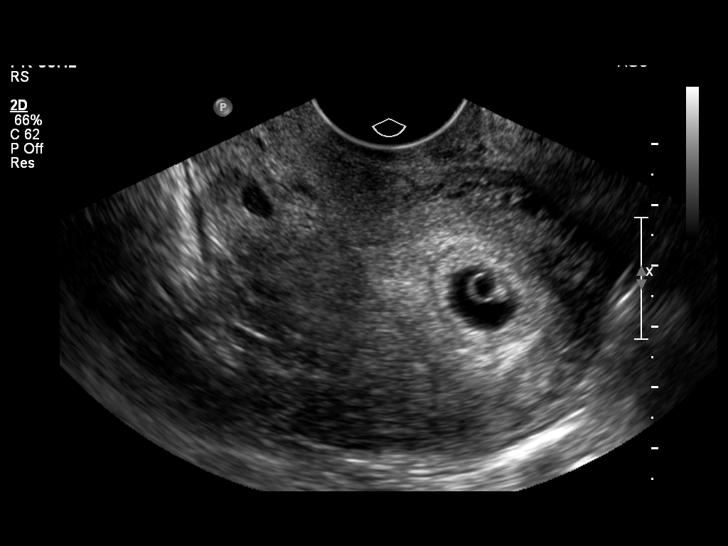

[14 of 28 positions shown; findings below may reference images not displayed]

IMPRESSION: See AS Obstetric US report.

## 2012-02-15 ENCOUNTER — Telehealth: Payer: Self-pay

## 2012-02-15 ENCOUNTER — Other Ambulatory Visit: Payer: Self-pay | Admitting: *Deleted

## 2012-02-15 DIAGNOSIS — M069 Rheumatoid arthritis, unspecified: Secondary | ICD-10-CM

## 2012-02-15 NOTE — Telephone Encounter (Signed)
Hi Dr. Macon Large, This patient called wanting a refill on her Tramadol according to your note patient was suppose to follow up with her Rheumatologist about this. What would you like me to do? Please let me know, thanks.

## 2012-02-15 NOTE — Telephone Encounter (Signed)
Patient is in need of refill due to her refills that were given expired.  Ok to refill 7 times.

## 2012-02-16 MED ORDER — TRAMADOL HCL 50 MG PO TABS: 50.0000 mg | ORAL_TABLET | Freq: Four times a day (QID) | ORAL | Status: DC | PRN

## 2012-02-16 NOTE — Telephone Encounter (Signed)
Addended by: Barbara Cower on: 02/16/2012 12:51 PM   Modules accepted: Orders

## 2012-02-17 ENCOUNTER — Encounter (HOSPITAL_COMMUNITY): Payer: Self-pay | Admitting: *Deleted

## 2012-02-17 ENCOUNTER — Emergency Department (HOSPITAL_COMMUNITY)
Admission: EM | Admit: 2012-02-17 | Discharge: 2012-02-17 | Disposition: A | Discharge status: Home / Self Care | Payer: Self-pay | Attending: Emergency Medicine | Admitting: Emergency Medicine

## 2012-02-17 ENCOUNTER — Emergency Department (HOSPITAL_COMMUNITY): Payer: Self-pay

## 2012-02-17 DIAGNOSIS — M79672 Pain in left foot: Secondary | ICD-10-CM

## 2012-02-17 DIAGNOSIS — M79609 Pain in unspecified limb: Secondary | ICD-10-CM | POA: Insufficient documentation

## 2012-02-17 DIAGNOSIS — M069 Rheumatoid arthritis, unspecified: Secondary | ICD-10-CM | POA: Insufficient documentation

## 2012-02-17 DIAGNOSIS — E079 Disorder of thyroid, unspecified: Secondary | ICD-10-CM | POA: Insufficient documentation

## 2012-02-17 MED ORDER — OXYCODONE-ACETAMINOPHEN 5-325 MG PO TABS: 1.0000 | ORAL_TABLET | Freq: Four times a day (QID) | ORAL | Status: AC | PRN

## 2012-02-17 NOTE — ED Notes (Signed)
Pt reports she had a hairline fracture to left foot and has a bone spur in area. Pt reports she has frequent swelling and pain to top of foot with increased activity.  Pt reports this episode of pain started two days ago.  Pt tried taking tramadol (4 tabs) earlier today without relief.  Pt has also tried OTC numbing patches without relief.  Pt reports bone spur located over second digit. Pt has been told to get surgery to shave down this area, but has been unable as she does not have insurance to cover.

## 2012-02-17 NOTE — ED Provider Notes (Signed)
History     CSN: [id number]  Arrival date & time 02/17/12  1756   First MD Initiated Contact with Patient 02/17/12 1826      Chief Complaint  Patient presents with  . Foot Pain    (Consider location/radiation/quality/duration/timing/severity/associated sxs/prior treatment) HPI Comments: Patient comes in with left foot pain that has been present for the past 2 days.  She has a history of bone spurs in the foot.  She also has a history of RA.  Pain located over the 2nd and 3rd metatarsal on the top of the foot.  No erythema or warmth.  No fevers or chills.  No acute injury or trauma.  She reports that she has an Orthopedic boot at home and has been taking Tramadol.  She does not feel that the Tramadol helps the pain.  She reports that she is a very active person and that the pain is worse with ambulation.    Patient is a 31 y.o. female presenting with lower extremity pain. The history is provided by the patient.  Foot Pain Pertinent negatives include no chills, fever, joint swelling or numbness.    Past Medical History  Diagnosis Date  . Thyroid disease   . Rheumatoid arthritis   . Abnormal Pap smear   . Allergy   . Depression     Past Surgical History  Procedure Date  . Tubal ligation     Family History  Problem Relation Age of Onset  . Diabetes Mother   . Hypertension Mother   . Alcohol abuse Paternal Grandmother   . Hypothyroidism Paternal Grandfather     History  Substance Use Topics  . Smoking status: Never Smoker   . Smokeless tobacco: Not on file  . Alcohol Use: No    OB History    Grav Para Term Preterm Abortions TAB SAB Ect Mult Living   4 2              Review of Systems  Constitutional: Negative for fever and chills.  Musculoskeletal: Negative for joint swelling.       Foot pain Pain with ambulation  Skin: Negative for color change and wound.  Neurological: Negative for numbness.    Allergies  Penicillins  Home Medications   Current  Outpatient Rx  Name Route Sig Dispense Refill  . ACYCLOVIR 200 MG PO CAPS Oral Take 600 mg by mouth 2 (two) times daily as needed. For cold sores.    . ESCITALOPRAM OXALATE 10 MG PO TABS  Take one and one half tablet daily for a total of 15 mg. 45 tablet 5    Patient desires brand name if possible  . LEVOTHYROXINE SODIUM 125 MCG PO TABS Oral Take 1 tablet (125 mcg total) by mouth daily. 30 tablet 10  . ADULT MULTIVITAMIN W/MINERALS CH Oral Take 1 tablet by mouth daily.    . TRAMADOL HCL 50 MG PO TABS Oral Take 50-100 mg by mouth every 6 (six) hours as needed. For pain.      BP 130/84  Pulse 103  Temp(Src) 98 F (36.7 C) (Oral)  Resp 18  SpO2 100%  LMP 02/01/2012  Physical Exam  Nursing note and vitals reviewed. Constitutional: She appears well-developed and well-nourished. No distress.  HENT:  Head: Normocephalic and atraumatic.  Mouth/Throat: Oropharynx is clear and moist.  Neck: Normal range of motion.  Cardiovascular: Normal rate, regular rhythm, normal heart sounds and intact distal pulses.   Pulses:      Dorsalis pedis  pulses are 2+ on the right side, and 2+ on the left side.  Pulmonary/Chest: Effort normal and breath sounds normal.  Musculoskeletal: Normal range of motion.       Tenderness to palpation of the left 2nd and 3rd metatarsal.  No swelling, erythema, or warmth.   No tenderness to palpation of the plantar fascia  Neurological: She is alert. No sensory deficit. Gait normal.  Skin: Skin is warm, dry and intact. No bruising and no ecchymosis noted. She is not diaphoretic. No erythema.  Psychiatric: She has a normal mood and affect.    ED Course  Procedures (including critical care time)  Labs Reviewed - No data to display Dg Foot Complete Left  02/17/2012  *RADIOLOGY REPORT*  Clinical Data: Dorsal pain around the second and third digit, radiating through the remainder of the foot.  Previous fracture to the second digit.  LEFT FOOT - COMPLETE 3+ VIEW  Comparison:  None.  Findings: No evidence of acute or subacute fracture or dislocation. Well-preserved joint spaces.  Well-preserved bone mineral density. No intrinsic osseous abnormalities.  IMPRESSION: Normal examination.  Original Report Authenticated By: Arnell Sieving, M.D.     No diagnosis found.    MDM  Patient with left foot pain.  Negative xray.  No erythema or warmth.  Denies fever or chills.  Patient instructed to follow up with PCP.          Pascal Lux [place], PA-C 02/18/12 [phone]

## 2012-02-18 NOTE — ED Provider Notes (Signed)
Medical screening examination/treatment/procedure(s) were performed by non-physician practitioner and as supervising physician I was immediately available for consultation/collaboration.  Flint Melter, MD 02/18/12 [phone]

## 2012-03-01 ENCOUNTER — Encounter (HOSPITAL_COMMUNITY): Payer: Self-pay

## 2012-03-01 ENCOUNTER — Emergency Department (HOSPITAL_COMMUNITY)
Admission: EM | Admit: 2012-03-01 | Discharge: 2012-03-01 | Disposition: A | Discharge status: Home / Self Care | Payer: Medicaid Other | Source: Home / Self Care | Attending: Emergency Medicine | Admitting: Emergency Medicine

## 2012-03-01 DIAGNOSIS — M79672 Pain in left foot: Secondary | ICD-10-CM

## 2012-03-01 DIAGNOSIS — M79609 Pain in unspecified limb: Secondary | ICD-10-CM

## 2012-03-01 MED ORDER — MELOXICAM 7.5 MG PO TABS: 7.5000 mg | ORAL_TABLET | Freq: Every day | ORAL | Status: DC

## 2012-03-01 NOTE — Discharge Instructions (Signed)
Please consult with the foot doctor and followup with your rheumatologist if pain was to persist. At this point your exam did not reveal any symptoms or signs of a fracture, dislocation vascular deficiencies or infection. Symptoms were more consistent with tendinitis of your foot follow-up with the foot doctor

## 2012-03-01 NOTE — ED Notes (Signed)
Pain in foot, no known injury

## 2012-03-01 NOTE — ED Notes (Signed)
Pt unhappy w no RX for percocet or vicodin. Dr Ladon Applebaum had discussed the pain medication offered today will be meloxicam and to follow up w podiatry for her continued problems. This Clinical research associate , at pt request , repeated request for pain medication w the same out come

## 2012-03-01 NOTE — ED Provider Notes (Signed)
History     CSN: [id number]  Arrival date & time 03/01/12  1622   First MD Initiated Contact with Patient 03/01/12 1649      Chief Complaint  Patient presents with  . Foot Pain    (Consider location/radiation/quality/duration/timing/severity/associated sxs/prior treatment) HPI Comments: It's been more than 2 weeks since this pain started, it is just not getting any better. He went to the emergency department and also at [facility] was prescribed some medicines and more prednisone, I usually don't take prednisone unless I have a flare-up", the pain is throbbing and is right on top of where I have my TATTO", swelling doesn't seem to be improving either.   " It just started hurting one morning when I woke up", I went to the emergency department and they did x-rays as well and they told me that there was nothing have also with the orthopedic at home and been taking some tramadol as well for the pain. Patient denies any numbness, tingling, or distal weakness. No constitutional symptoms such as fevers, body aches, chills or headaches. She denies any rheumatoid arthritis flareup of her hands at this point.  Patient is a 31 y.o. female presenting with lower extremity pain. The history is provided by the patient.  Foot Pain This is a recurrent problem. The problem occurs constantly. The problem has not changed since onset.Pertinent negatives include no chest pain and no abdominal pain. The symptoms are aggravated by walking. The symptoms are relieved by rest. The treatment provided no relief.    Past Medical History  Diagnosis Date  . Thyroid disease   . Rheumatoid arthritis   . Abnormal Pap smear   . Allergy   . Depression     Past Surgical History  Procedure Date  . Tubal ligation     Family History  Problem Relation Age of Onset  . Diabetes Mother   . Hypertension Mother   . Alcohol abuse Paternal Grandmother   . Hypothyroidism Paternal Grandfather      History  Substance Use Topics  . Smoking status: Never Smoker   . Smokeless tobacco: Not on file  . Alcohol Use: No    OB History    Grav Para Term Preterm Abortions TAB SAB Ect Mult Living   4 2              Review of Systems  Constitutional: Negative for fever, activity change, appetite change, fatigue and unexpected weight change.  Cardiovascular: Negative for chest pain.  Gastrointestinal: Negative for abdominal pain.  Skin: Negative for color change, rash and wound.  Neurological: Negative for weakness and numbness.    Allergies  Penicillins  Home Medications   Current Outpatient Rx  Name Route Sig Dispense Refill  . ACYCLOVIR 200 MG PO CAPS Oral Take 600 mg by mouth 2 (two) times daily as needed. For cold sores.    . ESCITALOPRAM OXALATE 10 MG PO TABS  Take one and one half tablet daily for a total of 15 mg. 45 tablet 5    Patient desires brand name if possible  . LEVOTHYROXINE SODIUM 125 MCG PO TABS Oral Take 1 tablet (125 mcg total) by mouth daily. 30 tablet 10  . MELOXICAM 7.5 MG PO TABS Oral Take 1 tablet (7.5 mg total) by mouth daily. 14 tablet 0  . ADULT MULTIVITAMIN W/MINERALS CH Oral Take 1 tablet by mouth daily.    . TRAMADOL HCL 50 MG PO TABS Oral Take 50-100 mg  by mouth every 6 (six) hours as needed. For pain.      BP 107/80  Pulse 110  Temp(Src) 98.5 F (36.9 C) (Oral)  Resp 18  SpO2 100%  LMP 02/01/2012  Physical Exam  Nursing note and vitals reviewed. Constitutional: She appears well-developed and well-nourished.  Musculoskeletal: She exhibits tenderness. She exhibits no edema.       Feet:  Skin: No rash noted. No erythema. No pallor.    ED Course  Procedures (including critical care time)  Labs Reviewed - No data to display No results found.   1. Left foot pain       MDM  Recurrent left foot pain dorsal aspect. No signs of localized infection. No erythema. Dorsalis pedis palpable equal comparatively to right foot. Patient  has seen 2-3 different providers, for the same ongoing symptoms. She went to a [facility]  urgent care were x-rays and lab work was done and no abnormalities were found. Have recommended patient to followup with private Center for further evaluation. In other treatment modalities and have offered her a anti-inflammatory cycle for 2 weeks. Patient has a primary care Dr. Zadie Cleverly, encourage her to followup with her doctor further pain management. Patient was requesting some Percocet for her pain.        Jimmie Molly, MD 03/01/12 1750

## 2012-03-02 ENCOUNTER — Ambulatory Visit: Payer: Self-pay | Admitting: Obstetrics & Gynecology

## 2012-03-17 ENCOUNTER — Telehealth: Payer: Self-pay | Admitting: *Deleted

## 2012-03-17 DIAGNOSIS — M069 Rheumatoid arthritis, unspecified: Secondary | ICD-10-CM

## 2012-03-17 MED ORDER — TRAMADOL HCL 50 MG PO TABS: 50.0000 mg | ORAL_TABLET | Freq: Four times a day (QID) | ORAL | Status: DC | PRN

## 2012-03-17 NOTE — Telephone Encounter (Signed)
Appointment made for follow up, patient was originally given refills to last until December 2013 but they expire after 3 months at the pharmacy.

## 2012-03-18 ENCOUNTER — Inpatient Hospital Stay (HOSPITAL_COMMUNITY): Payer: Self-pay

## 2012-03-18 ENCOUNTER — Inpatient Hospital Stay (HOSPITAL_COMMUNITY)
Admission: AD | Admit: 2012-03-18 | Discharge: 2012-03-18 | Disposition: A | Discharge status: Home / Self Care | Payer: Self-pay | Source: Ambulatory Visit | Attending: Obstetrics and Gynecology | Admitting: Obstetrics and Gynecology

## 2012-03-18 ENCOUNTER — Encounter (HOSPITAL_COMMUNITY): Payer: Self-pay

## 2012-03-18 DIAGNOSIS — N912 Amenorrhea, unspecified: Secondary | ICD-10-CM | POA: Insufficient documentation

## 2012-03-18 DIAGNOSIS — R109 Unspecified abdominal pain: Secondary | ICD-10-CM | POA: Insufficient documentation

## 2012-03-18 DIAGNOSIS — N949 Unspecified condition associated with female genital organs and menstrual cycle: Secondary | ICD-10-CM | POA: Insufficient documentation

## 2012-03-18 DIAGNOSIS — N83209 Unspecified ovarian cyst, unspecified side: Secondary | ICD-10-CM

## 2012-03-18 HISTORY — DX: Anxiety disorder, unspecified: F41.9

## 2012-03-18 LAB — URINALYSIS, ROUTINE W REFLEX MICROSCOPIC
Ketones, ur: NEGATIVE mg/dL
Nitrite: NEGATIVE
Protein, ur: NEGATIVE mg/dL
Urobilinogen, UA: 0.2 mg/dL (ref 0.0–1.0)

## 2012-03-18 LAB — CBC
MCH: 19.2 pg — ABNORMAL LOW (ref 26.0–34.0)
MCHC: 28.5 g/dL — ABNORMAL LOW (ref 30.0–36.0)
MCV: 67.4 fL — ABNORMAL LOW (ref 78.0–100.0)
Platelets: 465 10*3/uL — ABNORMAL HIGH (ref 150–400)
RBC: 4.73 MIL/uL (ref 3.87–5.11)
RDW: 17.8 % — ABNORMAL HIGH (ref 11.5–15.5)

## 2012-03-18 MED ORDER — MELOXICAM 7.5 MG PO TABS: 7.5000 mg | ORAL_TABLET | Freq: Every day | ORAL | Status: DC

## 2012-03-18 MED ORDER — TRAMADOL HCL 50 MG PO TABS
50.0000 mg | ORAL_TABLET | Freq: Once | ORAL | Status: AC
  Administered 2012-03-18: 50 mg via ORAL
  Filled 2012-03-18: qty 1

## 2012-03-18 MED ORDER — OXYCODONE-ACETAMINOPHEN 5-325 MG PO TABS: 1.0000 | ORAL_TABLET | ORAL | Status: AC | PRN

## 2012-03-18 NOTE — MAU Note (Signed)
No adverse effect from tramadol

## 2012-03-18 NOTE — Discharge Instructions (Signed)
Ovarian Cyst The ovaries are small organs that are on each side of the uterus. The ovaries are the organs that produce the female hormones, estrogen and progesterone. An ovarian cyst is a sac filled with fluid that can vary in its size. It is normal for a small cyst to form in women who are in the childbearing age and who have menstrual periods. This type of cyst is called a follicle cyst that becomes an ovulation cyst (corpus luteum cyst) after it produces the women's egg. It later goes away on its own if the woman does not become pregnant. There are other kinds of ovarian cysts that may cause problems and may need to be treated. The most serious problem is a cyst with cancer. It should be noted that menopausal women who have an ovarian cyst are at a higher risk of it being a cancer cyst. They should be evaluated very quickly, thoroughly and followed closely. This is especially true in menopausal women because of the high rate of ovarian cancer in women in menopause. CAUSES AND TYPES OF OVARIAN CYSTS:  FUNCTIONAL CYST: The follicle/corpus luteum cyst is a functional cyst that occurs every month during ovulation with the menstrual cycle. They go away with the next menstrual cycle if the woman does not get pregnant. Usually, there are no symptoms with a functional cyst.   ENDOMETRIOMA CYST: This cyst develops from the lining of the uterus tissue. This cyst gets in or on the ovary. It grows every month from the bleeding during the menstrual period. It is also called a "chocolate cyst" because it becomes filled with blood that turns brown. This cyst can cause pain in the lower abdomen during intercourse and with your menstrual period.   CYSTADENOMA CYST: This cyst develops from the cells on the outside of the ovary. They usually are not cancerous. They can get very big and cause lower abdomen pain and pain with intercourse. This type of cyst can twist on itself, cut off its blood supply and cause severe pain.  It also can easily rupture and cause a lot of pain.   DERMOID CYST: This type of cyst is sometimes found in both ovaries. They are found to have different kinds of body tissue in the cyst. The tissue includes skin, teeth, hair, and/or cartilage. They usually do not have symptoms unless they get very big. Dermoid cysts are rarely cancerous.   POLYCYSTIC OVARY: This is a rare condition with hormone problems that produces many small cysts on both ovaries. The cysts are follicle-like cysts that never produce an egg and become a corpus luteum. It can cause an increase in body weight, infertility, acne, increase in body and facial hair and lack of menstrual periods or rare menstrual periods. Many women with this problem develop type 2 diabetes. The exact cause of this problem is unknown. A polycystic ovary is rarely cancerous.   THECA LUTEIN CYST: Occurs when too much hormone (human chorionic gonadotropin) is produced and over-stimulates the ovaries to produce an egg. They are frequently seen when doctors stimulate the ovaries for invitro-fertilization (test tube babies).   LUTEOMA CYST: This cyst is seen during pregnancy. Rarely it can cause an obstruction to the birth canal during labor and delivery. They usually go away after delivery.  SYMPTOMS   Pelvic pain or pressure.   Pain during sexual intercourse.   Increasing girth (swelling) of the abdomen.   Abnormal menstrual periods.   Increasing pain with menstrual periods.   You stop having   menstrual periods and you are not pregnant.  DIAGNOSIS  The diagnosis can be made during:  Routine or annual pelvic examination (common).   Ultrasound.   X-ray of the pelvis.   CT Scan.   MRI.   Blood tests.  TREATMENT   Treatment may only be to follow the cyst monthly for 2 to 3 months with your caregiver. Many go away on their own, especially functional cysts.   May be aspirated (drained) with a long needle with ultrasound, or by laparoscopy  (inserting a tube into the pelvis through a small incision).   The whole cyst can be removed by laparoscopy.   Sometimes the cyst may need to be removed through an incision in the lower abdomen.   Hormone treatment is sometimes used to help dissolve certain cysts.   Birth control pills are sometimes used to help dissolve certain cysts.  HOME CARE INSTRUCTIONS  Follow your caregiver's advice regarding:  Medicine.   Follow up visits to evaluate and treat the cyst.   You may need to come back or make an appointment with another caregiver, to find the exact cause of your cyst, if your caregiver is not a gynecologist.   Get your yearly and recommended pelvic examinations and Pap tests.   Let your caregiver know if you have had an ovarian cyst in the past.  SEEK MEDICAL CARE IF:   Your periods are late, irregular, they stop, or are painful.   Your stomach (abdomen) or pelvic pain does not go away.   Your stomach becomes larger or swollen.   You have pressure on your bladder or trouble emptying your bladder completely.   You have painful sexual intercourse.   You have feelings of fullness, pressure, or discomfort in your stomach.   You lose weight for no apparent reason.   You feel generally ill.   You become constipated.   You lose your appetite.   You develop acne.   You have an increase in body and facial hair.   You are gaining weight, without changing your exercise and eating habits.   You think you are pregnant.  SEEK IMMEDIATE MEDICAL CARE IF:   You have increasing abdominal pain.   You feel sick to your stomach (nausea) and/or vomit.   You develop a fever that comes on suddenly.   You develop abdominal pain during a bowel movement.   Your menstrual periods become heavier than usual.  Document Released: 09/14/2005 Document Revised: 09/03/2011 Document Reviewed: 07/18/2009 [employer] Patient Information 2012 [employer]. 

## 2012-03-18 NOTE — MAU Provider Note (Signed)
History     CSN: [id number]  Arrival date and time: 03/18/12 5621   First Provider Initiated Contact with Patient 03/18/12 0830      Chief Complaint  Patient presents with  . Abdominal Pain   HPI This is a 31 y.o. female who presents with amenorrhea and pelvic pain for several days. Has had some nausea also. Is on Tramadol and Mobic for Rheum. Arthritis, but has not seen Rheumatologist lately due to insurance lapse.  Has not had a period this month. Periods have been irregular since tubal   RN NOTE: Pt states had tubal ligation after last baby via Dr. Macon Large, is having nausea suddenly, esp. last pm. Unable to eat this am. Denies diarrhea. LMP-01/27/2012, no menses x2 months  OB History    Grav Para Term Preterm Abortions TAB SAB Ect Mult Living   4 2 1 1 2 2    2       Past Medical History  Diagnosis Date  . Thyroid disease   . Abnormal Pap smear   . Allergy   . Depression   . Rheumatoid arthritis   . Anxiety     Past Surgical History  Procedure Date  . Tubal ligation   . Laparoscopic tubal ligation     Family History  Problem Relation Age of Onset  . Diabetes Mother   . Hypertension Mother   . Alcohol abuse Paternal Grandmother   . Hypothyroidism Paternal Grandfather   . Other Neg Hx     History  Substance Use Topics  . Smoking status: Never Smoker   . Smokeless tobacco: Never Used  . Alcohol Use: No    Allergies:  Allergies  Allergen Reactions  . Penicillins Other (See Comments)    Lurline Hare syndrom    Prescriptions prior to admission  Medication Sig Dispense Refill  . acetaminophen (TYLENOL) 500 MG tablet Take 1,000 mg by mouth once. For pain      . escitalopram (LEXAPRO) 10 MG tablet Take one and one half tablet daily for a total of 15 mg.  45 tablet  5  . levothyroxine (LEVOXYL) 125 MCG tablet Take 1 tablet (125 mcg total) by mouth daily.  30 tablet  10  . meloxicam (MOBIC) 7.5 MG tablet Take 1 tablet (7.5 mg total) by mouth daily.  14  tablet  0  . Multiple Vitamin (MULITIVITAMIN WITH MINERALS) TABS Take 1 tablet by mouth daily.      . traMADol (ULTRAM) 50 MG tablet Take 1-2 tablets (50-100 mg total) by mouth every 6 (six) hours as needed. For pain.  60 tablet  6  . acyclovir (ZOVIRAX) 200 MG capsule Take 600 mg by mouth 2 (two) times daily as needed. For cold sores.        ROS As listed in HPI  Physical Exam   Blood pressure 135/84, pulse 99, temperature 98.4 F (36.9 C), temperature source Oral, resp. rate 18, height 5\' 4"  (1.626 m), weight 203 lb 12.8 oz (92.443 kg), last menstrual period 01/27/2012, SpO2 100.00%.  Physical Exam  Constitutional: She is oriented to person, place, and time. She appears well-developed and well-nourished. No distress.  HENT:  Head: Normocephalic.  Cardiovascular: Normal rate.   Respiratory: Effort normal.  GI: Soft. She exhibits no distension and no mass. There is tenderness (diffusely all over, but more so on RLQ). There is no rebound and no guarding.  Genitourinary: Vagina normal and uterus normal. No vaginal discharge found.  Musculoskeletal: Normal range of motion.  Neurological: She is alert and oriented to person, place, and time.  Skin: Skin is warm and dry.  Psychiatric: She has a normal mood and affect.    US Pelvis Complete  03/18/2012  *RADIOLOGY REPORT*  Clinical Data: Pelvic pain, nausea.  TRANSABDOMINAL AND TRANSVAGINAL ULTRASOUND OF PELVIS Technique:  Both transabdominal and transvaginal ultrasound examinations of the pelvis were performed. Transabdominal technique was performed for global imaging of the pelvis including uterus, ovaries, adnexal regions, and pelvic cul-de-sac.  It was necessary to proceed with endovaginal exam following the transabdominal exam to visualize the endometrium and adnexa.  Comparison:  02/18/2009  Findings:  Uterus: Retroverted., measuring approximately 6.1 x 4.3 centimeters.  Difficult to accurately measure transvaginally as the cervix was  low-lying.  Endometrium: There is a small amount of free fluid within the endometrial canal.  The endometrium measures approximately 8 mm in thickness, within normal range.  Right ovary:  Measures 5.1 x 2.9 x 3.7 cm.  There are two hemorrhagic cysts, the largest of which measures 2.1 cm. The smaller measures 1.5 cm. This corresponded to an area of pain during the examination.  Left ovary: Normal sonographic appearance, measuring 4.6 x 1.8 x 3.7 cm.  Other findings: No free fluid  IMPRESSION: Two right ovarian hemorrhagic cysts.  No free fluid.  Low lying cervix.  Correlate with pelvic examination if concerned for pelvic floor laxity.  Original Report Authenticated By: Waneta Martins, M.D.   Results for orders placed during the hospital encounter of 03/18/12 (from the past 72 hour(s))  URINALYSIS, ROUTINE W REFLEX MICROSCOPIC     Status: Abnormal   Collection Time   03/18/12  7:40 AM      Component Value Range Comment   Color, Urine YELLOW  YELLOW    APPearance CLEAR  CLEAR    Specific Gravity, Urine >1.030 (*) 1.005 - 1.030    pH 6.0  5.0 - 8.0    Glucose, UA NEGATIVE  NEGATIVE mg/dL    Hgb urine dipstick NEGATIVE  NEGATIVE    Bilirubin Urine SMALL (*) NEGATIVE    Ketones, ur NEGATIVE  NEGATIVE mg/dL    Protein, ur NEGATIVE  NEGATIVE mg/dL    Urobilinogen, UA 0.2  0.0 - 1.0 mg/dL    Nitrite NEGATIVE  NEGATIVE    Leukocytes, UA NEGATIVE  NEGATIVE MICROSCOPIC NOT DONE ON URINES WITH NEGATIVE PROTEIN, BLOOD, LEUKOCYTES, NITRITE, OR GLUCOSE <1000 mg/dL.  POCT PREGNANCY, URINE     Status: Normal   Collection Time   03/18/12  8:16 AM      Component Value Range Comment   Preg Test, Ur NEGATIVE  NEGATIVE   CBC     Status: Abnormal   Collection Time   03/18/12  9:07 AM      Component Value Range Comment   WBC 11.2 (*) 4.0 - 10.5 K/uL    RBC 4.73  3.87 - 5.11 MIL/uL    Hemoglobin 9.1 (*) 12.0 - 15.0 g/dL    HCT 96.0 (*) 45.4 - 46.0 %    MCV 67.4 (*) 78.0 - 100.0 fL    MCH 19.2 (*) 26.0 - 34.0  pg    MCHC 28.5 (*) 30.0 - 36.0 g/dL    RDW 09.8 (*) 11.9 - 15.5 %    Platelets 465 (*) 150 - 400 K/uL     MAU Course  Procedures  MDM Discussed with patient. Will Refill mobic and add Percocet #30 due to two cysts.   Assessment and Plan  A:  Right Hemorrhagic ovarian cysts x 2      Amenorrhea, secondary probably related to anovulation P:  Discharge home      Rx meds as above for pain      Expect resolution with time      Return as needed for followup   [facility] 03/18/2012, 11:01 AM

## 2012-03-18 NOTE — MAU Note (Signed)
Pt states had tubal ligation after last baby via Dr. Macon Large, is having nausea suddenly, esp. last pm. Unable to eat this am. Denies diarrhea. LMP-01/27/2012, no menses x2 months.

## 2012-03-18 NOTE — MAU Note (Signed)
Patient state she has been having lower abdominal pain for about 3 days, getting worse and has had nausea since yesterday. Had a BTL last year after her last delivery had irregular periods since having the tubal.

## 2012-03-21 NOTE — MAU Provider Note (Signed)
Agree with above note.  Dana Elliott 03/21/2012 1:16 PM

## 2012-03-22 ENCOUNTER — Telehealth: Payer: Self-pay

## 2012-03-22 NOTE — Telephone Encounter (Signed)
Pt called and stated that she was in MAU where you found out she has ovarian cyst.  She is having pain and nausea.  "I was put on percocet for the pain but it makes me drowsy so I just take it at night".  "Could I have vicodin instead so I can have some pain relief during the day without feeling drowsy.

## 2012-03-23 ENCOUNTER — Other Ambulatory Visit (HOSPITAL_COMMUNITY): Payer: Self-pay | Admitting: Nurse Practitioner

## 2012-03-23 MED ORDER — HYDROCODONE-ACETAMINOPHEN 5-500 MG PO TABS: 1.0000 | ORAL_TABLET | Freq: Four times a day (QID) | ORAL | Status: AC | PRN

## 2012-03-29 NOTE — Telephone Encounter (Signed)
Patient is a [facility] patient will forward to that office.

## 2012-03-30 ENCOUNTER — Telehealth: Payer: Self-pay

## 2012-03-30 NOTE — Telephone Encounter (Signed)
Opened in error

## 2012-04-07 ENCOUNTER — Ambulatory Visit: Payer: Self-pay | Admitting: Obstetrics & Gynecology

## 2012-04-08 ENCOUNTER — Encounter (HOSPITAL_COMMUNITY): Payer: Self-pay

## 2012-04-08 ENCOUNTER — Emergency Department (HOSPITAL_COMMUNITY)
Admission: EM | Admit: 2012-04-08 | Discharge: 2012-04-08 | Disposition: A | Discharge status: Home / Self Care | Payer: Self-pay | Attending: Emergency Medicine | Admitting: Emergency Medicine

## 2012-04-08 DIAGNOSIS — M069 Rheumatoid arthritis, unspecified: Secondary | ICD-10-CM | POA: Insufficient documentation

## 2012-04-08 DIAGNOSIS — M1990 Unspecified osteoarthritis, unspecified site: Secondary | ICD-10-CM

## 2012-04-08 DIAGNOSIS — Z760 Encounter for issue of repeat prescription: Secondary | ICD-10-CM | POA: Insufficient documentation

## 2012-04-08 DIAGNOSIS — Z79899 Other long term (current) drug therapy: Secondary | ICD-10-CM | POA: Insufficient documentation

## 2012-04-08 MED ORDER — MELOXICAM 7.5 MG PO TABS: 7.5000 mg | ORAL_TABLET | Freq: Every day | ORAL | Status: DC

## 2012-04-08 MED ORDER — OXYCODONE-ACETAMINOPHEN 5-325 MG PO TABS: 1.0000 | ORAL_TABLET | Freq: Four times a day (QID) | ORAL | Status: AC | PRN

## 2012-04-08 MED ORDER — PREDNISONE 20 MG PO TABS: 60.0000 mg | ORAL_TABLET | Freq: Every day | ORAL | Status: AC

## 2012-04-08 NOTE — ED Notes (Signed)
Pt has arthritis and has flare ups.  Unable to see MD until medicaid approved.  Needs script for pain meds.

## 2012-04-08 NOTE — ED Provider Notes (Signed)
History     CSN: [id number]  Arrival date & time 04/08/12  4098   First MD Initiated Contact with Patient 04/08/12 0535      Chief Complaint  Patient presents with  . Medication Refill    (Consider location/radiation/quality/duration/timing/severity/associated sxs/prior treatment) HPI HX per PT, hand and foot pain with h/o RA diagnosed when she lived in [place]. Having worsening pain that feels like her chronic arthritis. No local PCP, scheduled to see Rhuematologist this Thursday. No F/C, no n/v/d. Requesting RX until she can be evaluated. No rash or swelling. No CP or SOB.  Past Medical History  Diagnosis Date  . Thyroid disease   . Abnormal Pap smear   . Allergy   . Depression   . Rheumatoid arthritis   . Anxiety     Past Surgical History  Procedure Date  . Tubal ligation   . Laparoscopic tubal ligation     Family History  Problem Relation Age of Onset  . Diabetes Mother   . Hypertension Mother   . Alcohol abuse Paternal Grandmother   . Hypothyroidism Paternal Grandfather   . Other Neg Hx     History  Substance Use Topics  . Smoking status: Never Smoker   . Smokeless tobacco: Never Used  . Alcohol Use: No    OB History    Grav Para Term Preterm Abortions TAB SAB Ect Mult Living   4 2 1 1 2 2    2       Review of Systems  Constitutional: Negative for fever and chills.  HENT: Negative for neck pain and neck stiffness.   Eyes: Negative for pain.  Respiratory: Negative for shortness of breath.   Cardiovascular: Negative for chest pain.  Gastrointestinal: Negative for abdominal pain.  Genitourinary: Negative for dysuria.  Musculoskeletal: Negative for back pain and joint swelling.  Skin: Negative for rash.  Neurological: Negative for headaches.  All other systems reviewed and are negative.    Allergies  Penicillins  Home Medications   Current Outpatient Rx  Name Route Sig Dispense Refill  . LEVOTHYROXINE SODIUM 125 MCG PO TABS Oral Take 1 tablet  (125 mcg total) by mouth daily. 30 tablet 10  . MELOXICAM 7.5 MG PO TABS Oral Take 1 tablet (7.5 mg total) by mouth daily. 14 tablet 0  . OVER THE COUNTER MEDICATION Oral Take 1 tablet by mouth daily. OTC. 5HTP Herbal Supplement    . TRAMADOL HCL 50 MG PO TABS Oral Take 1-2 tablets (50-100 mg total) by mouth every 6 (six) hours as needed. For pain. 60 tablet 6    BP 113/83  Pulse 106  Temp 98 F (36.7 C)  Resp 18  SpO2 100%  LMP 03/30/2012  Physical Exam  Constitutional: She is oriented to person, place, and time. She appears well-developed and well-nourished.  HENT:  Head: Normocephalic and atraumatic.  Eyes: Conjunctivae and EOM are normal. Pupils are equal, round, and reactive to light.  Neck: Trachea normal. Neck supple. No thyromegaly present.  Cardiovascular: Normal rate, regular rhythm, S1 normal, S2 normal and normal pulses.     No systolic murmur is present   No diastolic murmur is present  Pulses:      Radial pulses are 2+ on the right side, and 2+ on the left side.  Pulmonary/Chest: Effort normal and breath sounds normal. She has no wheezes. She has no rhonchi. She has no rales. She exhibits no tenderness.  Abdominal: Soft. Normal appearance and bowel sounds are normal. There  is no tenderness. There is no CVA tenderness and negative Murphy's sign.  Musculoskeletal:       BLE:s Calves nontender, no cords or erythema, negative Homans sign  Neurological: She is alert and oriented to person, place, and time. She has normal strength. No cranial nerve deficit or sensory deficit. GCS eye subscore is 4. GCS verbal subscore is 5. GCS motor subscore is 6.  Skin: Skin is warm and dry. No rash noted. She is not diaphoretic.  Psychiatric: Her speech is normal.       Cooperative and appropriate    ED Course  Procedures (including critical care time)  Old records- previous ED visits reviewed.  MDM   RA requesting med refill. RX provided. Plan Rhuem f/u with PCP referrals  provided.         Sunnie Nielsen, MD 04/08/12 [phone]

## 2012-04-12 IMAGING — US US OB FOLLOW-UP
1 series · 14 of 28 positions shown · non-contrast
Comparison: none

[Series 1: us ob follow-up · 14 of 60 slices shown]
[im 3/60]
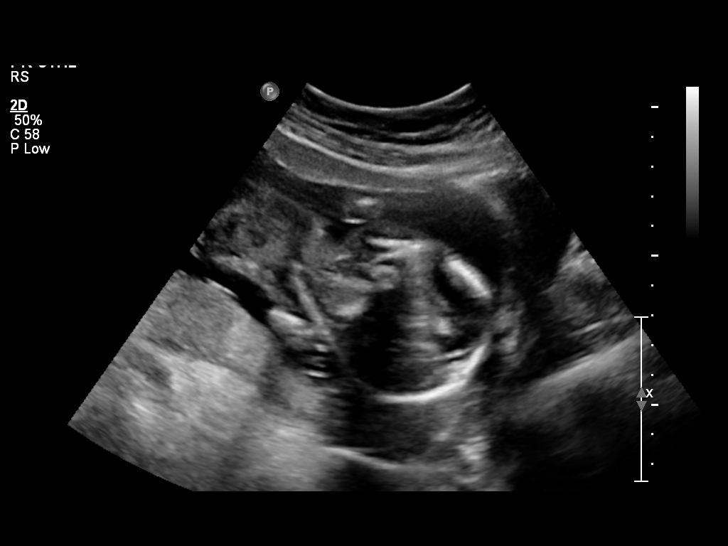
[im 7/60]
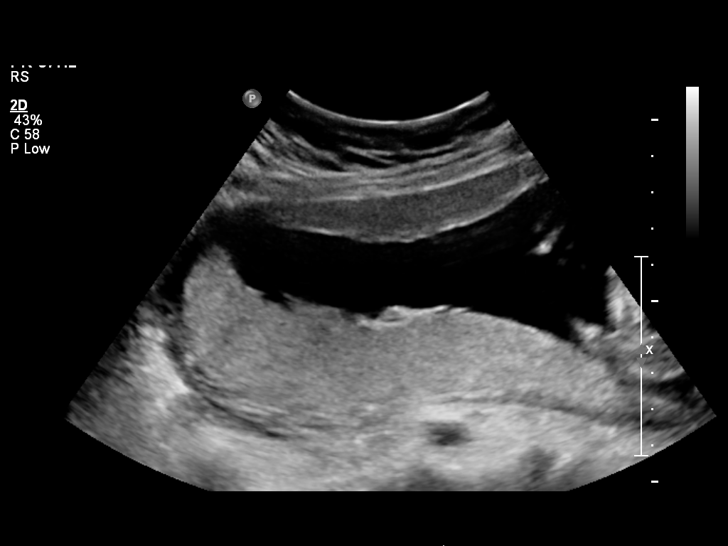
[im 11/60]
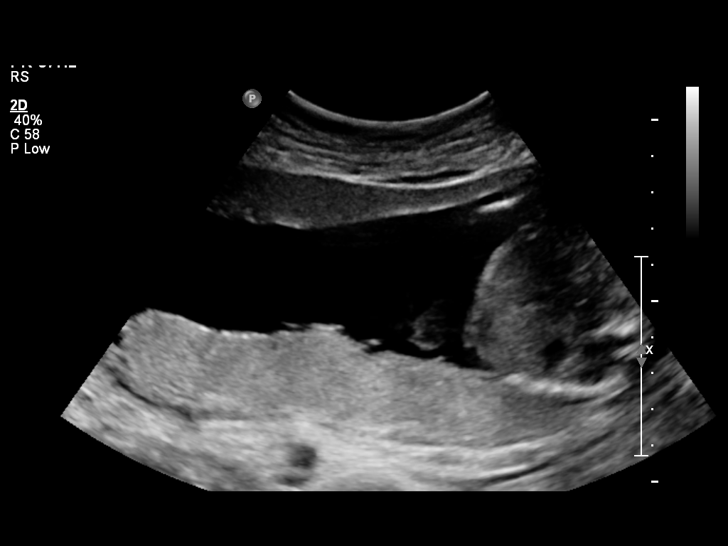
[im 16/60]
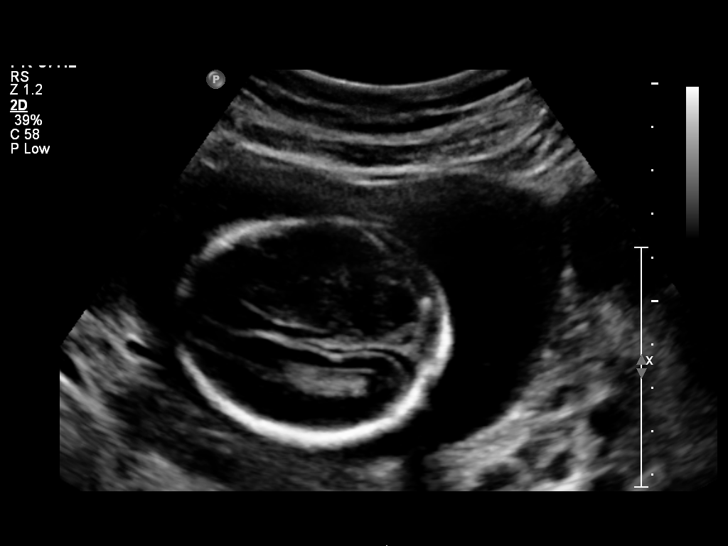
[im 20/60]
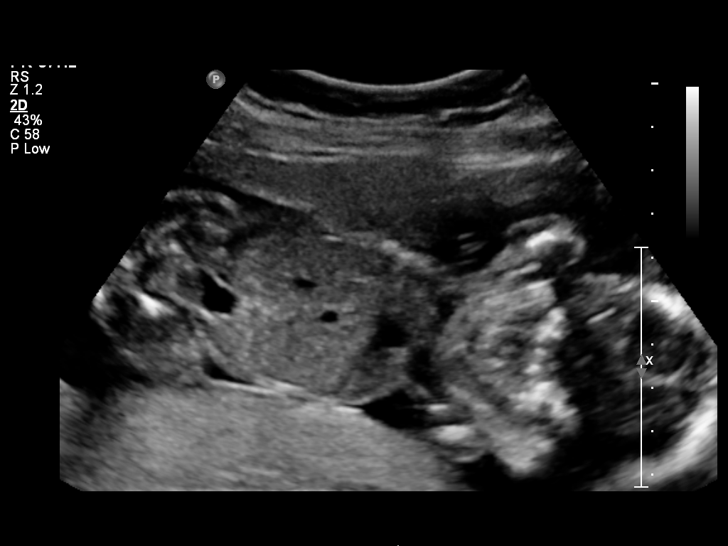
[im 25/60]
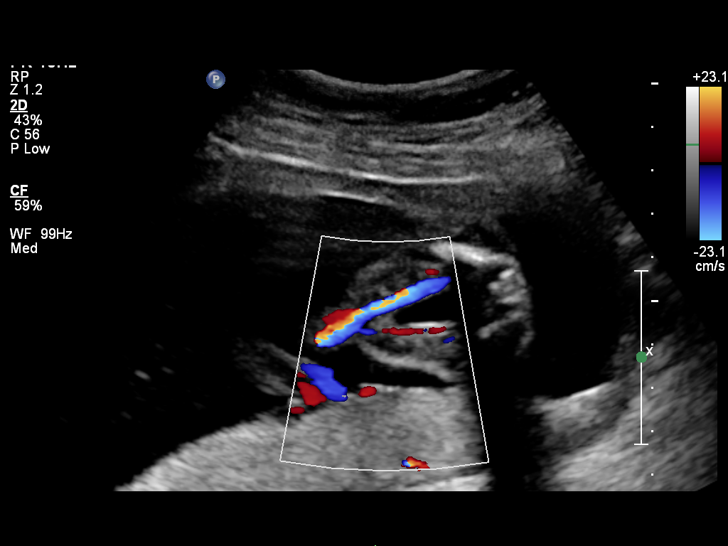
[im 29/60]
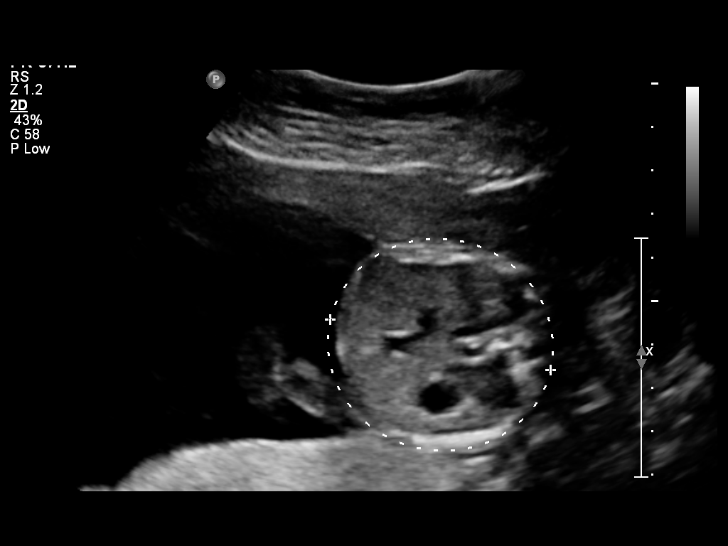
[im 33/60]
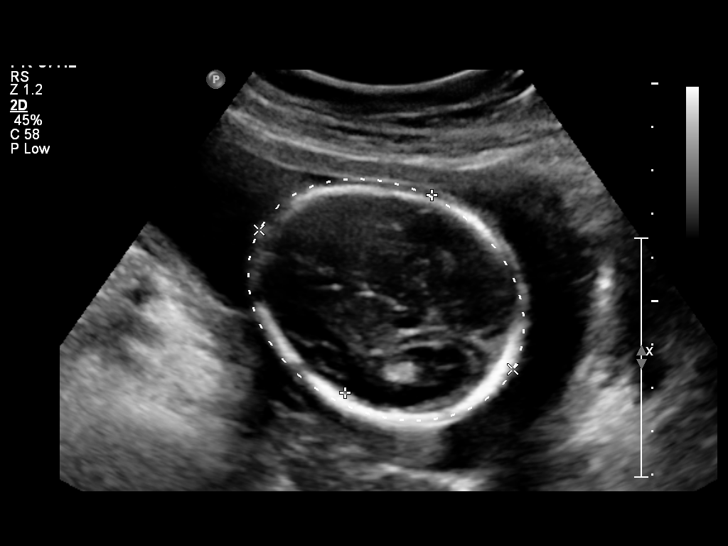
[im 38/60]
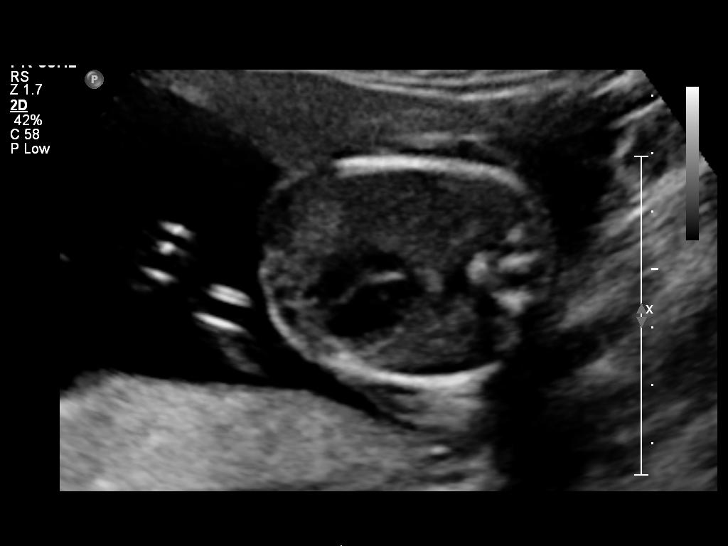
[im 42/60]
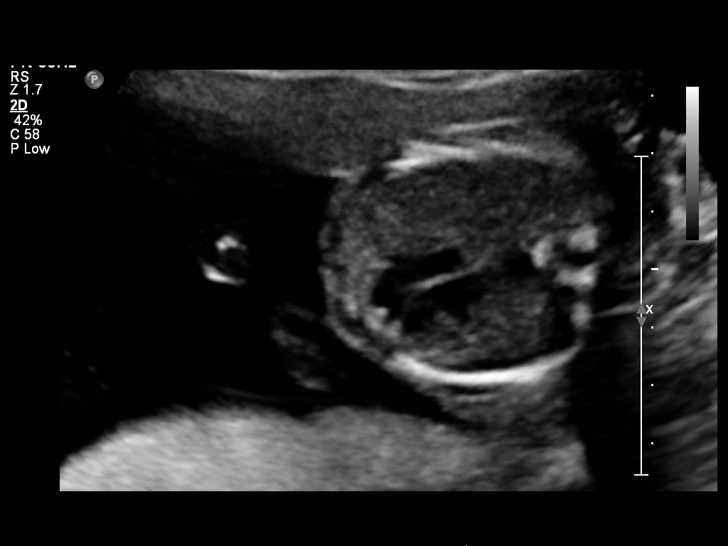
[im 46/60]
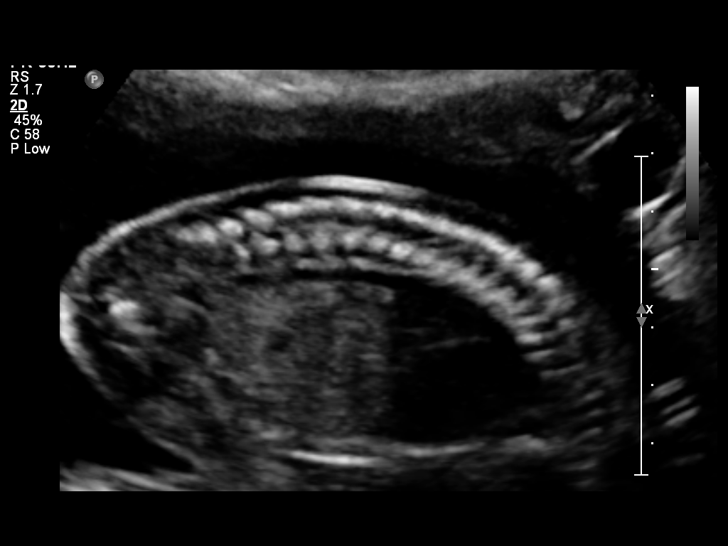
[im 51/60]
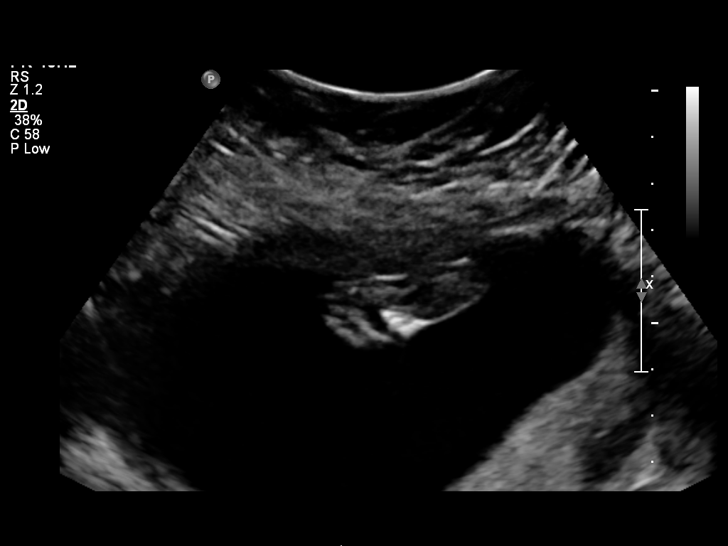
[im 55/60]
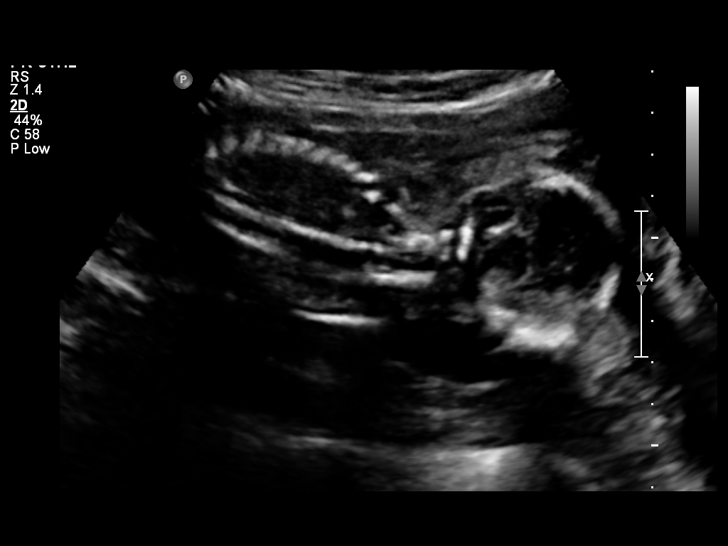
[im 60/60]
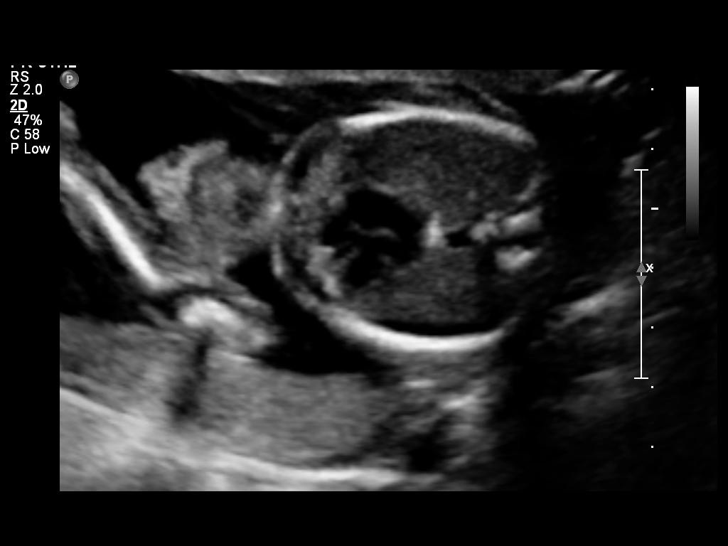

[14 of 28 positions shown; findings below may reference images not displayed]

Canned report from images found in remote index.

Refer to host system for actual result text.

## 2012-04-23 ENCOUNTER — Other Ambulatory Visit: Payer: Self-pay | Admitting: Obstetrics & Gynecology

## 2012-04-26 ENCOUNTER — Telehealth: Payer: Self-pay | Admitting: Family Medicine

## 2012-04-26 NOTE — Telephone Encounter (Signed)
Patient called stating there is a Scientist, clinical (histocompatibility and immunogenetics) of her thyroid medicine Levoxyl.  She needed [place] to authorize the generic, otherwise she cannot get it filled.  She gets her Levoxyl at [facility] on [address], ph# [phone].  She also needs a refill on her daily Prednisone, this one she gets at [facility] on [address].

## 2012-04-27 ENCOUNTER — Other Ambulatory Visit: Payer: Self-pay | Admitting: *Deleted

## 2012-04-27 DIAGNOSIS — E039 Hypothyroidism, unspecified: Secondary | ICD-10-CM

## 2012-04-27 NOTE — Telephone Encounter (Signed)
levoxyl was authorized but not prednisone, patient was advised to follow up with rheumatologist for any medication relating to her arthritis.

## 2012-05-16 ENCOUNTER — Other Ambulatory Visit: Payer: Self-pay | Admitting: Obstetrics & Gynecology

## 2012-05-17 ENCOUNTER — Inpatient Hospital Stay (HOSPITAL_COMMUNITY)
Admission: AD | Admit: 2012-05-17 | Discharge: 2012-05-17 | Disposition: A | Discharge status: Home / Self Care | Payer: Self-pay | Source: Ambulatory Visit | Attending: Obstetrics & Gynecology | Admitting: Obstetrics & Gynecology

## 2012-05-17 ENCOUNTER — Encounter (HOSPITAL_COMMUNITY): Payer: Self-pay | Admitting: *Deleted

## 2012-05-17 ENCOUNTER — Inpatient Hospital Stay (HOSPITAL_COMMUNITY): Payer: Self-pay

## 2012-05-17 DIAGNOSIS — N949 Unspecified condition associated with female genital organs and menstrual cycle: Secondary | ICD-10-CM | POA: Insufficient documentation

## 2012-05-17 DIAGNOSIS — R102 Pelvic and perineal pain: Secondary | ICD-10-CM

## 2012-05-17 DIAGNOSIS — N739 Female pelvic inflammatory disease, unspecified: Secondary | ICD-10-CM | POA: Insufficient documentation

## 2012-05-17 DIAGNOSIS — M069 Rheumatoid arthritis, unspecified: Secondary | ICD-10-CM

## 2012-05-17 DIAGNOSIS — R109 Unspecified abdominal pain: Secondary | ICD-10-CM | POA: Insufficient documentation

## 2012-05-17 DIAGNOSIS — Z880 Allergy status to penicillin: Secondary | ICD-10-CM | POA: Insufficient documentation

## 2012-05-17 HISTORY — DX: Unspecified ovarian cyst, unspecified side: N83.209

## 2012-05-17 LAB — URINALYSIS, ROUTINE W REFLEX MICROSCOPIC
Ketones, ur: 15 mg/dL — AB
Nitrite: NEGATIVE
pH: 5.5 (ref 5.0–8.0)

## 2012-05-17 LAB — CBC
MCH: 18.7 pg — ABNORMAL LOW (ref 26.0–34.0)
MCHC: 27.9 g/dL — ABNORMAL LOW (ref 30.0–36.0)
MCV: 67.1 fL — ABNORMAL LOW (ref 78.0–100.0)
Platelets: 464 10*3/uL — ABNORMAL HIGH (ref 150–400)
RDW: 18.3 % — ABNORMAL HIGH (ref 11.5–15.5)
WBC: 10.8 10*3/uL — ABNORMAL HIGH (ref 4.0–10.5)

## 2012-05-17 LAB — WET PREP, GENITAL

## 2012-05-17 MED ORDER — TRAMADOL HCL 50 MG PO TABS: 50.0000 mg | ORAL_TABLET | Freq: Four times a day (QID) | ORAL | Status: DC | PRN

## 2012-05-17 MED ORDER — DOXYCYCLINE HYCLATE 50 MG PO CAPS: 100.0000 mg | ORAL_CAPSULE | Freq: Two times a day (BID) | ORAL | Status: AC

## 2012-05-17 MED ORDER — OXYCODONE-ACETAMINOPHEN 5-325 MG PO TABS
2.0000 | ORAL_TABLET | ORAL | Status: AC
  Administered 2012-05-17: 2 via ORAL
  Filled 2012-05-17: qty 2

## 2012-05-17 NOTE — MAU Note (Signed)
Pt reports "i was diagnosed with ovarian cyst 2 month ago and it is like that only a lot worse"

## 2012-05-17 NOTE — MAU Provider Note (Signed)
Sheng 47  Chief Complaint  Patient presents with  . Abdominal Pain     First Provider Initiated Contact with Patient 05/17/12 [phone]      SUBJECTIVE  HPI: Pt presents to MAU with pelvic pain described as "like my cysts 2 months ago but 10 times worse".  She has vomited x1 this morning because of the pain.  She denies vaginal bleeding, vaginal itching/burning, urinary symptoms, h/a, dizziness, or fever/chills. Patient's last menstrual period was 05/04/2012.    Past Medical History  Diagnosis Date  . Thyroid disease   . Abnormal Pap smear   . Allergy   . Depression   . Rheumatoid arthritis   . Anxiety   . Ovarian cyst    Past Surgical History  Procedure Date  . Tubal ligation   . Laparoscopic tubal ligation    History   Social History  . Marital Status: Divorced    Spouse Name: N/A    Number of Children: N/A  . Years of Education: N/A   Occupational History  . Not on file.   Social History Main Topics  . Smoking status: Never Smoker   . Smokeless tobacco: Never Used  . Alcohol Use: No  . Drug Use: No  . Sexually Active: Not Currently -- Female partner(s)    Birth Control/ Protection: Surgical   Other Topics Concern  . Not on file   Social History Narrative  . No narrative on file   No current facility-administered medications on file prior to encounter.   Current Outpatient Prescriptions on File Prior to Encounter  Medication Sig Dispense Refill  . levothyroxine (LEVOXYL) 125 MCG tablet Take 1 tablet (125 mcg total) by mouth daily.  30 tablet  10  . meloxicam (MOBIC) 7.5 MG tablet Take 1 tablet (7.5 mg total) by mouth daily.  14 tablet  0  . meloxicam (MOBIC) 7.5 MG tablet Take 1 tablet (7.5 mg total) by mouth daily.  14 tablet  0  . traMADol (ULTRAM) 50 MG tablet Take 1-2 tablets (50-100 mg total) by mouth every 6 (six) hours as needed. For pain.  60 tablet  6   Allergies  Allergen Reactions  . Penicillins Other (See Comments)    Lurline Hare syndrom    ROS: Pertinent items in HPI  OBJECTIVE Blood pressure 129/76, pulse 113, temperature 98.6 F (37 C), temperature source Oral, resp. rate 20, last menstrual period 05/04/2012, SpO2 100.00%.  GENERAL: Well-developed, well-nourished female in no acute distress.  HEENT: Normocephalic, good dentition HEART: normal rate RESP: normal effort ABDOMEN: Soft, diffusely tender entire lower abdomen below umbilicus EXTREMITIES: Nontender, no edema NEURO: Alert and oriented GU: external vagina normal without rash or lesions, internal vagina pink with minor amount of thin, white, discharge. No discharge from cervix. Cervical motion tenderness present. Unable to palpate ovaries.  Neg CVA tenderness  LAB RESULTS Results for orders placed during the hospital encounter of 05/17/12 (from the past 24 hour(s))  URINALYSIS, ROUTINE W REFLEX MICROSCOPIC     Status: Abnormal   Collection Time   05/17/12  6:54 AM      Component Value Range   Color, Urine YELLOW  YELLOW   APPearance CLEAR  CLEAR   Specific Gravity, Urine >1.030 (*) 1.005 - 1.030   pH 5.5  5.0 - 8.0   Glucose, UA NEGATIVE  NEGATIVE mg/dL   Hgb urine dipstick NEGATIVE  NEGATIVE   Bilirubin Urine SMALL (*) NEGATIVE   Ketones, ur 15 (*) NEGATIVE mg/dL  Protein, ur NEGATIVE  NEGATIVE mg/dL   Urobilinogen, UA 0.2  0.0 - 1.0 mg/dL   Nitrite NEGATIVE  NEGATIVE   Leukocytes, UA NEGATIVE  NEGATIVE  CBC     Status: Abnormal   Collection Time   05/17/12  7:45 AM      Component Value Range   WBC 10.8 (*) 4.0 - 10.5 K/uL   RBC 4.65  3.87 - 5.11 MIL/uL   Hemoglobin 8.7 (*) 12.0 - 15.0 g/dL   HCT 57.8 (*) 46.9 - 62.9 %   MCV 67.1 (*) 78.0 - 100.0 fL   MCH 18.7 (*) 26.0 - 34.0 pg   MCHC 27.9 (*) 30.0 - 36.0 g/dL   RDW 52.8 (*) 41.3 - 24.4 %   Platelets 464 (*) 150 - 400 K/uL  POCT PREGNANCY, URINE     Status: Normal   Collection Time   05/17/12  8:02 AM      Component Value Range   Preg Test, Ur NEGATIVE  NEGATIVE  WET  PREP, GENITAL     Status: Abnormal   Collection Time   05/17/12  9:15 AM      Component Value Range   Yeast Wet Prep HPF POC NONE SEEN  NONE SEEN   Trich, Wet Prep NONE SEEN  NONE SEEN   Clue Cells Wet Prep HPF POC NONE SEEN  NONE SEEN   WBC, Wet Prep HPF POC FEW (*) NONE SEEN    Care assumed by Georges Mouse, CNM   LEFTWICH-KIRBY, LISA 05/17/2012 7:27 AM  Assessment and Plan DDX: ovarian cyst, PID, STI, UTI  Patients ultrasound was normal with no cysts seen. Urine and wet prep were wnl. Given cervical motion tenderness PID is likely. Patient is allergic to penicillin so Doxy 100 BID for 7 days was prescribed. Pt will follow-up at [facility] in 1 week.  Final DX: PID  V. Marland Kitchen, [place], PA-S2  I saw and examined patient along with student and agree with above note.

## 2012-05-17 NOTE — MAU Provider Note (Signed)
Attestation of Attending Supervision of Advanced Practitioner (CNM/NP): Evaluation and management procedures were performed by the Advanced Practitioner under my supervision and collaboration.  I have reviewed the Advanced Practitioner's note and chart, and I agree with the management and plan.  Sahra Converse, MD, FACOG Attending Obstetrician & Gynecologist Faculty Practice, [facility], [place]  

## 2012-05-25 ENCOUNTER — Encounter (HOSPITAL_COMMUNITY): Payer: Self-pay

## 2012-05-25 ENCOUNTER — Emergency Department (HOSPITAL_COMMUNITY)
Admission: EM | Admit: 2012-05-25 | Discharge: 2012-05-25 | Disposition: A | Discharge status: Home / Self Care | Payer: Self-pay | Attending: Emergency Medicine | Admitting: Emergency Medicine

## 2012-05-25 DIAGNOSIS — E079 Disorder of thyroid, unspecified: Secondary | ICD-10-CM | POA: Insufficient documentation

## 2012-05-25 DIAGNOSIS — H6690 Otitis media, unspecified, unspecified ear: Secondary | ICD-10-CM | POA: Insufficient documentation

## 2012-05-25 DIAGNOSIS — H6693 Otitis media, unspecified, bilateral: Secondary | ICD-10-CM

## 2012-05-25 DIAGNOSIS — M069 Rheumatoid arthritis, unspecified: Secondary | ICD-10-CM | POA: Insufficient documentation

## 2012-05-25 MED ORDER — OXYCODONE-ACETAMINOPHEN 5-325 MG PO TABS
2.0000 | ORAL_TABLET | Freq: Once | ORAL | Status: DC
  Administered 2012-05-25: 2 via ORAL

## 2012-05-25 MED ORDER — HYDROCODONE-ACETAMINOPHEN 5-325 MG PO TABS: 2.0000 | ORAL_TABLET | Freq: Four times a day (QID) | ORAL | Status: AC | PRN

## 2012-05-25 MED ORDER — CLINDAMYCIN HCL 150 MG PO CAPS: 150.0000 mg | ORAL_CAPSULE | Freq: Three times a day (TID) | ORAL | Status: AC

## 2012-05-25 MED ORDER — CLINDAMYCIN HCL 150 MG PO CAPS
150.0000 mg | ORAL_CAPSULE | Freq: Once | ORAL | Status: AC
  Administered 2012-05-25: 150 mg via ORAL
  Filled 2012-05-25: qty 1

## 2012-05-25 MED ORDER — OXYCODONE-ACETAMINOPHEN 5-325 MG PO TABS
ORAL_TABLET | ORAL | Status: AC
  Administered 2012-05-25: 2 via ORAL
  Filled 2012-05-25: qty 2

## 2012-05-25 NOTE — ED Provider Notes (Signed)
Medical screening examination/treatment/procedure(s) were performed by non-physician practitioner and as supervising physician I was immediately available for consultation/collaboration.   Lyanne Co, MD 05/25/12 [phone]

## 2012-05-25 NOTE — ED Provider Notes (Signed)
History     CSN: [id number]  Arrival date & time 05/25/12  4098   First MD Initiated Contact with Patient 05/25/12 0131      Chief Complaint  Patient presents with  . Otalgia    (Consider location/radiation/quality/duration/timing/severity/associated sxs/prior treatment) HPI Comments: Patient states she's had pain in both ears for several, days.  Last night, left ear became more painful, and started draining fluid.  She has taken several over-the-counter medications, as well as some leftover eardrops without any relief  Patient is a 31 y.o. female presenting with ear pain. The history is provided by the patient.  Otalgia This is a new problem. There is pain in both ears. The problem occurs constantly. The problem has been gradually worsening. There has been no fever. The pain is at a severity of 10/10. The pain is severe. Associated symptoms include ear discharge. Pertinent negatives include no headaches, no hearing loss, no rhinorrhea, no sore throat, no neck pain and no cough. Her past medical history does not include hearing loss.    Past Medical History  Diagnosis Date  . Thyroid disease   . Abnormal Pap smear   . Allergy   . Depression   . Rheumatoid arthritis   . Anxiety   . Ovarian cyst     Past Surgical History  Procedure Date  . Tubal ligation   . Laparoscopic tubal ligation     Family History  Problem Relation Age of Onset  . Diabetes Mother   . Hypertension Mother   . Alcohol abuse Paternal Grandmother   . Hypothyroidism Paternal Grandfather   . Other Neg Hx     History  Substance Use Topics  . Smoking status: Never Smoker   . Smokeless tobacco: Never Used  . Alcohol Use: No    OB History    Grav Para Term Preterm Abortions TAB SAB Ect Mult Living   4 2 1 1 2 2    2       Review of Systems  Constitutional: Negative for fever and chills.  HENT: Positive for ear pain and ear discharge. Negative for hearing loss, congestion, sore throat,  rhinorrhea and neck pain.   Respiratory: Negative for cough.   Skin: Negative for wound.  Neurological: Negative for dizziness and headaches.    Allergies  Penicillins  Home Medications   Current Outpatient Rx  Name Route Sig Dispense Refill  . DOXYCYCLINE HYCLATE 50 MG PO CAPS Oral Take 2 capsules (100 mg total) by mouth 2 (two) times daily. 14 capsule 0  . LEVOTHYROXINE SODIUM 125 MCG PO TABS Oral Take 1 tablet (125 mcg total) by mouth daily. 30 tablet 10  . MELOXICAM 7.5 MG PO TABS Oral Take 1 tablet (7.5 mg total) by mouth daily. 14 tablet 0  . TRAMADOL HCL 50 MG PO TABS Oral Take 1-2 tablets (50-100 mg total) by mouth every 6 (six) hours as needed. For pain. 60 tablet 0  . CLINDAMYCIN HCL 150 MG PO CAPS Oral Take 1 capsule (150 mg total) by mouth 3 (three) times daily. 30 capsule 0  . HYDROCODONE-ACETAMINOPHEN 5-325 MG PO TABS Oral Take 2 tablets by mouth every 6 (six) hours as needed for pain. 30 tablet 0    BP 123/74  Pulse 111  Temp 98.5 F (36.9 C) (Oral)  Resp 18  Ht 5\' 6"  (1.676 m)  Wt 185 lb (83.915 kg)  BMI 29.86 kg/m2  SpO2 100%  LMP 05/04/2012  Physical Exam  Constitutional: She appears well-developed  and well-nourished.  HENT:  Head: Normocephalic.  Right Ear: There is tenderness. No mastoid tenderness. Tympanic membrane is erythematous.  Left Ear: There is drainage and tenderness. No mastoid tenderness. Tympanic membrane is erythematous.  Eyes: Pupils are equal, round, and reactive to light.  Neck: Normal range of motion.  Cardiovascular: Normal rate.   Pulmonary/Chest: Effort normal.  Abdominal: Soft.    ED Course  Procedures (including critical care time)  Labs Reviewed - No data to display No results found.   1. Bilateral otitis media       MDM   Treat with clindamycin.  Pain control, and followup with ENT        Arman Filter, NP 05/25/12 0345

## 2012-05-25 NOTE — ED Notes (Signed)
Pt. Came up to check in window stating that she had a puss fluid draining out of her ear.

## 2012-05-25 NOTE — ED Notes (Signed)
Pt states pain in left ear that started yesterday.  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  States pain is now severe and she is having light pink fluid draining from her left ear.  Using tylenol, tramadol,auralgan and advil with no relief.  Pt is tearful.

## 2012-06-04 ENCOUNTER — Encounter (HOSPITAL_COMMUNITY): Payer: Self-pay | Admitting: Emergency Medicine

## 2012-06-04 ENCOUNTER — Emergency Department (INDEPENDENT_AMBULATORY_CARE_PROVIDER_SITE_OTHER)
Admission: EM | Admit: 2012-06-04 | Discharge: 2012-06-04 | Disposition: A | Discharge status: Home / Self Care | Payer: Self-pay | Source: Home / Self Care | Attending: Emergency Medicine | Admitting: Emergency Medicine

## 2012-06-04 DIAGNOSIS — M069 Rheumatoid arthritis, unspecified: Secondary | ICD-10-CM

## 2012-06-04 MED ORDER — TRAMADOL HCL 50 MG PO TABS: 100.0000 mg | ORAL_TABLET | Freq: Three times a day (TID) | ORAL | Status: AC | PRN

## 2012-06-04 MED ORDER — PREDNISONE 10 MG PO TABS: 10.0000 mg | ORAL_TABLET | Freq: Every day | ORAL | Status: DC

## 2012-06-04 MED ORDER — OXYCODONE-ACETAMINOPHEN 5-325 MG PO TABS: ORAL_TABLET | ORAL | Status: AC

## 2012-06-04 NOTE — ED Notes (Signed)
Dx of rheumatoid arthritis. Pt in need of meds. Does not have a pcp at this time. Having pain in fingers,wrist, and knees.

## 2012-06-04 NOTE — ED Provider Notes (Signed)
Chief Complaint  Patient presents with  . Rheumatoid Arthritis    med refill    History of Present Illness:   The patient is a 31 year old female with a 2 to three-year history of rheumatoid arthritis. This has been treated by a rheumatologist in [facility]. She's currently taking prednisone 10 mg per day, tramadol 50 mg one to 2 every 8 hours, and Percocet as needed for severe flareups. She plans to see a rheumatologist here in town. She's been living here about 2 years, but apparently is getting her prescriptions from the rheumatologist in [place]. She describes swelling, aching, and stiffness in all the joints of her hands, wrists, and also her knees. She has morning stiffness. She relates a positive rheumatoid factor. She has not been tried on any other medications such as disease modifying agents for rheumatoid arthritis.  Review of Systems:  Other than noted above, the patient denies any of the following symptoms: Systemic:  No fevers, chills, sweats, or aches.  No fatigue or tiredness. Musculoskeletal:  No joint pain, arthritis, bursitis, swelling, back pain, or neck pain. Neurological:  No muscular weakness, paresthesias, headache, or trouble with speech or coordination.  No dizziness.  PMFSH:  Past medical history, family history, social history, meds, and allergies were reviewed.  Physical Exam:   Vital signs:  BP 98/67  Pulse 105  Temp 98.5 F (36.9 C) (Oral)  Resp 20  SpO2 100%  LMP 05/07/2012 Gen:  Alert and oriented times 3.  In no distress. Musculoskeletal: There was tenderness to palpation over all of her PIP, DIP joints, MCP, and wrist joints. No swelling and full range of motion of all her joints. There was no joint deformity. No pain to palpation over her elbows, shoulders, hips, or ankles. She does have pain to palpation over her MTPs on her left side but none right. Her knees show some tenderness to palpation over the medial joint lines but full range of motion  with minimal pain, no swelling, no crepitus. Otherwise, all joints had a full a ROM with no swelling, bruising or deformity.  No edema, pulses full. Extremities were warm and pink.  Capillary refill was brisk.  Skin:  Clear, warm and dry.  No rash. Neuro:  Alert and oriented times 3.  Muscle strength was normal.  Sensation was intact to light touch.   Assessment:  The encounter diagnosis was Rheumatoid arthritis.  Her regimen for rheumatoid arthritis is unusual, and I'm surprised that her rheumatologist would prescribe. I think she should be on a disease modifying agent and should get away from the prednisone and the opioids. I have told her this. I will give her enough for 2 weeks. After that she needs to see a rheumatologist here in town.  Plan:   1.  The following meds were prescribed:   New Prescriptions   OXYCODONE-ACETAMINOPHEN (PERCOCET) 5-325 MG PER TABLET    1 to 2 tablets every 6 hours as needed for pain.   PREDNISONE (DELTASONE) 10 MG TABLET    Take 1 tablet (10 mg total) by mouth daily.   TRAMADOL (ULTRAM) 50 MG TABLET    Take 2 tablets (100 mg total) by mouth every 8 (eight) hours as needed for pain.   2.  The patient was instructed in symptomatic care, including rest and activity, elevation, application of ice and compression.  Appropriate handouts were given. 3.  The patient was told to return if becoming worse in any way, if no better in 3  or 4 days, and given some red flag symptoms that would indicate earlier return.   4.  The patient was told to follow up with Dr. will Kellie Simmering within the next 2 weeks.   Reuben Likes, MD 06/04/12 2118

## 2012-06-25 ENCOUNTER — Emergency Department (HOSPITAL_COMMUNITY): Payer: Self-pay

## 2012-06-25 ENCOUNTER — Encounter (HOSPITAL_COMMUNITY): Payer: Self-pay

## 2012-06-25 ENCOUNTER — Emergency Department (HOSPITAL_COMMUNITY)
Admission: EM | Admit: 2012-06-25 | Discharge: 2012-06-25 | Disposition: A | Discharge status: Home / Self Care | Payer: Self-pay | Attending: Emergency Medicine | Admitting: Emergency Medicine

## 2012-06-25 DIAGNOSIS — E0789 Other specified disorders of thyroid: Secondary | ICD-10-CM | POA: Insufficient documentation

## 2012-06-25 DIAGNOSIS — S199XXA Unspecified injury of neck, initial encounter: Secondary | ICD-10-CM | POA: Insufficient documentation

## 2012-06-25 DIAGNOSIS — S0993XA Unspecified injury of face, initial encounter: Secondary | ICD-10-CM | POA: Insufficient documentation

## 2012-06-25 DIAGNOSIS — W208XXA Other cause of strike by thrown, projected or falling object, initial encounter: Secondary | ICD-10-CM | POA: Insufficient documentation

## 2012-06-25 DIAGNOSIS — M069 Rheumatoid arthritis, unspecified: Secondary | ICD-10-CM | POA: Insufficient documentation

## 2012-06-25 DIAGNOSIS — F329 Major depressive disorder, single episode, unspecified: Secondary | ICD-10-CM | POA: Insufficient documentation

## 2012-06-25 DIAGNOSIS — M542 Cervicalgia: Secondary | ICD-10-CM | POA: Insufficient documentation

## 2012-06-25 DIAGNOSIS — Z79899 Other long term (current) drug therapy: Secondary | ICD-10-CM | POA: Insufficient documentation

## 2012-06-25 DIAGNOSIS — F3289 Other specified depressive episodes: Secondary | ICD-10-CM | POA: Insufficient documentation

## 2012-06-25 DIAGNOSIS — F411 Generalized anxiety disorder: Secondary | ICD-10-CM | POA: Insufficient documentation

## 2012-06-25 DIAGNOSIS — R51 Headache: Secondary | ICD-10-CM | POA: Insufficient documentation

## 2012-06-25 MED ORDER — TRAMADOL HCL 50 MG PO TABS
100.0000 mg | ORAL_TABLET | Freq: Once | ORAL | Status: AC
  Administered 2012-06-25: 100 mg via ORAL
  Filled 2012-06-25: qty 2

## 2012-06-25 MED ORDER — DIPHENHYDRAMINE HCL 50 MG/ML IJ SOLN: 50.0000 mg | Freq: Once | INTRAMUSCULAR | Status: DC

## 2012-06-25 MED ORDER — TRAMADOL-ACETAMINOPHEN 37.5-325 MG PO TABS: ORAL_TABLET | ORAL | Status: DC

## 2012-06-25 MED ORDER — METHOCARBAMOL 500 MG PO TABS: ORAL_TABLET | ORAL | Status: DC

## 2012-06-25 MED ORDER — SODIUM CHLORIDE 0.9 % IV SOLN: INTRAVENOUS | Status: DC

## 2012-06-25 MED ORDER — METOCLOPRAMIDE HCL 5 MG/ML IJ SOLN: 10.0000 mg | Freq: Once | INTRAMUSCULAR | Status: DC

## 2012-06-25 NOTE — ED Notes (Signed)
PT WAS PLAYING WITH CHILDREN AND THEN STARTED TO HAVE NECK PAIN AFTER SON FELL ON HER NECK.

## 2012-06-25 NOTE — ED Provider Notes (Signed)
History  This chart was scribed for Ward Givens, MD by Bennett Scrape. This patient was seen in room TR02C/TR02C and the patient's care was started at 4:35PM.  CSN: [id number]  Arrival date & time 06/25/12  1333   None     Chief Complaint  Patient presents with  . Neck Injury    The history is provided by the patient. No language interpreter was used.    Dana Elliott is a 31 y.o. female who presents to the Emergency Department complaining of 24 hours of occipitally located HA described as pressure that radiates into the lateral neck that started when her 93 year old 100 lb son fell onto her neck while they were playing. She states that she was laying on her stomach on the floor with her head turned and resting on her arm when her son tried to jump over her and fell onto her neck/head. The neck pain and HA are worse with laying on her back. She states that she has been taking tylenol and tramadol (which she takes for rheumatoid arthritis) with mild improvement in symptoms. She reports she has run out of the tramadol. She reports that she has a h/o migraines that are usually sharp pains in temples. She denies LOC, nausea, emesis, numbness and weakness as associated symptoms.   She has a h/o thyroid disease, depression and rheumatoid arthritis which she also takes prednisone as needed and percocet for break through pain. She denies smoking and alcohol use.   No PCP. OB-GYN is Dr. Macon Large with [facility].   Past Medical History  Diagnosis Date  . Thyroid disease   . Abnormal Pap smear   . Allergy   . Depression   . Rheumatoid arthritis   . Anxiety   . Ovarian cyst     Past Surgical History  Procedure Date  . Tubal ligation   . Laparoscopic tubal ligation     Family History  Problem Relation Age of Onset  . Diabetes Mother   . Hypertension Mother   . Alcohol abuse Paternal Grandmother   . Hypothyroidism Paternal Grandfather   . Other Neg Hx     History  Substance Use  Topics  . Smoking status: Never Smoker   . Smokeless tobacco: Never Used  . 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     Allergies  Penicillins  Home Medications   Current Outpatient Rx  Name Route Sig Dispense Refill  . LEVOTHYROXINE SODIUM 125 MCG PO TABS Oral Take 125 mcg by mouth daily.    Marland Kitchen PREDNISONE 10 MG PO TABS Oral Take 10 mg by mouth daily as needed. For RA    . TRAMADOL HCL 50 MG PO TABS Oral Take 50-100 mg by mouth every 6 (six) hours.      Triage Vitals: BP 113/81  Pulse 104  Temp 98.3 F (36.8 C) (Oral)  Resp 20  SpO2 100%  LMP 05/07/2012  Vital signs normal except tachycardia   Physical Exam  Nursing note and vitals reviewed. Constitutional: She is oriented to person, place, and time. She appears well-developed and well-nourished. No distress.  HENT:  Head: Normocephalic and atraumatic.  Eyes: EOM are normal.  Neck: Normal range of  motion. Neck supple. No tracheal deviation present.       Tender along the upper cervical midline/base of skull, tender along the SCMs bilaterally, pain in left SCM when looking to the right, pain in right SCM when looking to the left, mid to lower cervical spine are nontender.   Cardiovascular: Normal rate.   Pulmonary/Chest: Effort normal. No respiratory distress.  Musculoskeletal: Normal range of motion.  Neurological: She is alert and oriented to person, place, and time.  Skin: Skin is warm and dry.  Psychiatric: She has a normal mood and affect. Her behavior is normal.    ED Course  Procedures (including critical care time)   Medications  0.9 %  sodium chloride infusion (not administered)  metoCLOPramide (REGLAN) injection 10 mg (not administered)  diphenhydrAMINE (BENADRYL) injection 50 mg (not administered)  traMADol-acetaminophen  (ULTRACET) 37.5-325 MG per tablet (not administered)  methocarbamol (ROBAXIN) 500 MG tablet (not administered)  traMADol (ULTRAM) tablet 100 mg (100 mg Oral Given 06/25/12 1719)    Although discussed with patient when the nurse went in she refused IV and IV medicines. At time of discharge she also declined IM Toradol.  DIAGNOSTIC STUDIES: Oxygen Saturation is 100% on room air, normal by my interpretation.    COORDINATION OF CARE: 4:45PM-Discussed treatment plan with pt at bedside and pt agreed to plan.  5:55PM-Pt rechecked and is resting comfortably. Informed pt of negative radiology reports and she acknowledged these reports. Pt turned down IV medications and IM Toradol to treat her migraine. Discussed discharge plan with pt at bedside and pt agreed to plan.  Review of the [place] controlled substance site shows she has received 9 narcotic prescriptions since May including 3 in August and one in September. She has used 6 pharmacies and the prescriptions were written by 7 providers including 3 ED personnel.  Ct Head Wo Contrast Ct Cervical Spine Wo Contrast  06/25/2012  *RADIOLOGY REPORT*  Clinical Data:  Headache, neck pain  CT HEAD WITHOUT CONTRAST CT CERVICAL SPINE WITHOUT CONTRAST  Technique:  Multidetector CT imaging of the head and cervical spine was performed following the standard protocol without intravenous contrast.  Multiplanar CT image reconstructions of the cervical spine were also generated.  Comparison:  CT head dated 05/18/2010  CT HEAD  Findings: No evidence of parenchymal hemorrhage or extra-axial fluid collection. No mass lesion, mass effect, or midline shift.  No CT evidence of acute infarction.  Cerebral volume is age appropriate.  No ventriculomegaly.  The visualized paranasal sinuses are essentially clear. The mastoid air cells are unopacified.  No evidence of calvarial fracture.  IMPRESSION: Normal head CT.  CT CERVICAL SPINE  Findings: Reversal of the normal  cervical lordosis.  No evidence of fracture or dislocation.  Vertebral body heights and intervertebral disc spaces are maintained.  The dens appears intact.  No prevertebral soft tissue swelling.  Visualized thyroid is unremarkable.  Visualized lung apices are clear.  IMPRESSION: Normal cervical spine CT.   Original Report Authenticated By: Charline Bills, M.D.      1. Headache   2. Neck pain    New Prescriptions   METHOCARBAMOL (ROBAXIN) 500 MG TABLET    Take 1 or 2 po Q 6hrs for muscle soreness and pain   TRAMADOL-ACETAMINOPHEN (ULTRACET) 37.5-325 MG PER TABLET    2 tabs po QID prn pain    Devoria Albe, MD, FACEP    MDM  CT of neck done and set of C-spine x-rays because of her history rheumatoid arthritis  and concern for atlanto-occipital abnormalities.    I personally performed the services described in this documentation, which was scribed in my presence. The recorded information has been reviewed and considered.  Devoria Albe, MD, Armando Gang    Ward Givens, MD 06/25/12 [phone]

## 2012-06-25 NOTE — ED Notes (Signed)
Patient states the accident occurred 3pm on yesterday.  She has tried otc meds w/o relief.  She could not sleep due to pain

## 2012-07-11 ENCOUNTER — Other Ambulatory Visit: Payer: Self-pay | Admitting: *Deleted

## 2012-07-11 ENCOUNTER — Telehealth: Payer: Self-pay | Admitting: Gynecology

## 2012-07-11 DIAGNOSIS — E079 Disorder of thyroid, unspecified: Secondary | ICD-10-CM

## 2012-07-11 MED ORDER — LEVOTHYROXINE SODIUM 125 MCG PO TABS: 125.0000 ug | ORAL_TABLET | Freq: Every day | ORAL | Status: DC

## 2012-07-11 NOTE — Telephone Encounter (Signed)
Dana Elliott from [facility] in [place] left a message on nurse voice mail . Patient seen at [facility] office - requests refill on levothyroxine, states last filled 06/01/12

## 2012-07-11 NOTE — Telephone Encounter (Signed)
Patient called regarding refill on her synthroid medication. Advice patient will give her # 30 with no refill. Patient aware need to make follow up with physician and will not refill anymore until she do so. Per patient will call back office and make appointment.

## 2012-08-16 ENCOUNTER — Encounter (HOSPITAL_COMMUNITY): Payer: Self-pay | Admitting: Emergency Medicine

## 2012-08-16 ENCOUNTER — Emergency Department (HOSPITAL_COMMUNITY)
Admission: EM | Admit: 2012-08-16 | Discharge: 2012-08-16 | Disposition: A | Discharge status: Home / Self Care | Payer: Self-pay | Attending: Emergency Medicine | Admitting: Emergency Medicine

## 2012-08-16 DIAGNOSIS — Z760 Encounter for issue of repeat prescription: Secondary | ICD-10-CM | POA: Insufficient documentation

## 2012-08-16 DIAGNOSIS — E079 Disorder of thyroid, unspecified: Secondary | ICD-10-CM | POA: Insufficient documentation

## 2012-08-16 DIAGNOSIS — F411 Generalized anxiety disorder: Secondary | ICD-10-CM | POA: Insufficient documentation

## 2012-08-16 DIAGNOSIS — F329 Major depressive disorder, single episode, unspecified: Secondary | ICD-10-CM | POA: Insufficient documentation

## 2012-08-16 DIAGNOSIS — M069 Rheumatoid arthritis, unspecified: Secondary | ICD-10-CM | POA: Insufficient documentation

## 2012-08-16 DIAGNOSIS — Z8742 Personal history of other diseases of the female genital tract: Secondary | ICD-10-CM | POA: Insufficient documentation

## 2012-08-16 DIAGNOSIS — Z79899 Other long term (current) drug therapy: Secondary | ICD-10-CM | POA: Insufficient documentation

## 2012-08-16 DIAGNOSIS — F3289 Other specified depressive episodes: Secondary | ICD-10-CM | POA: Insufficient documentation

## 2012-08-16 MED ORDER — MELOXICAM 7.5 MG PO TABS: 7.5000 mg | ORAL_TABLET | Freq: Every day | ORAL | Status: DC

## 2012-08-16 MED ORDER — OXYCODONE-ACETAMINOPHEN 5-325 MG PO TABS: 1.0000 | ORAL_TABLET | ORAL | Status: DC | PRN

## 2012-08-16 MED ORDER — TRAMADOL HCL 50 MG PO TABS: 50.0000 mg | ORAL_TABLET | Freq: Four times a day (QID) | ORAL | Status: DC | PRN

## 2012-08-16 NOTE — ED Notes (Addendum)
Pt stats she is out of medications for arthritis and was unable to see MD due to insurance problems and not having money up front. Pt states she takes Meloxicam, Tramadol and Percocet for her arthritis and is out of medications. Pt is requesting refill of these medications.

## 2012-08-16 NOTE — ED Provider Notes (Signed)
Medical screening examination/treatment/procedure(s) were performed by non-physician practitioner and as supervising physician I was immediately available for consultation/collaboration.  Olivia Mackie, MD 08/16/12 [phone]

## 2012-08-16 NOTE — ED Notes (Signed)
Pt is out of meds, needs refills.PWD.VSS

## 2012-08-16 NOTE — ED Provider Notes (Signed)
History     CSN: [id number]  Arrival date & time 08/16/12  0020   First MD Initiated Contact with Patient 08/16/12 0046      Chief Complaint  Patient presents with  . Medication Refill   HPI  History provided by the patient. Patient is a 31 year old female with history of anxiety, depression, hypothyroidism and rheumatoid arthritis who presents with requests for refill of medications to treat her pain symptoms from rheumatoid arthritis. Patient reports originally been from [place] where she was seen by rheumatology specialist and diagnosed with RA. She has significant family history for RA. Patient reports previously being on several medications for this including methotrexate and at times prednisone. Currently patient states she takes daily tramadol and meloxicam for joint pain. After returning to [facility] area one year ago she has not followed up with her primary care provider or rheumatology specialist. She was also not taking any of these medications during a recent pregnancy. Currently patient states she is in the process of following up with a rheumatology specialist and even made an appointment but currently is uninsured and cannot afford the appointment. She has been approved for Medicaid and she should be receiving a insurance card at any time. Patient is requesting prescriptions for tramadol and Mobic to help with her chronic pain. She denies any other complaints today.   Past Medical History  Diagnosis Date  . Thyroid disease   . Abnormal Pap smear   . Allergy   . Depression   . Rheumatoid arthritis   . Anxiety   . Ovarian cyst     Past Surgical History  Procedure Date  . Tubal ligation   . Laparoscopic tubal ligation     Family History  Problem Relation Age of Onset  . Diabetes Mother   . Hypertension Mother   . Alcohol abuse Paternal Grandmother   . Hypothyroidism Paternal Grandfather   . Other Neg Hx     History  Substance Use Topics  . Smoking  status: Never Smoker   . Smokeless tobacco: Never Used  . Alcohol Use: No    OB History    Grav Para Term Preterm Abortions TAB SAB Ect Mult Living   4 2 1 1 2 2    2       Review of Systems  Constitutional: Negative for fever, chills, diaphoresis and unexpected weight change.  Respiratory: Negative for cough.   Cardiovascular: Negative for chest pain.  Musculoskeletal: Positive for arthralgias.  Neurological: Negative for weakness and numbness.    Allergies  Penicillins  Home Medications   Current Outpatient Rx  Name  Route  Sig  Dispense  Refill  . LEVOTHYROXINE SODIUM 125 MCG PO TABS   Oral   Take 1 tablet (125 mcg total) by mouth daily.   30 tablet   3   . MELOXICAM 7.5 MG PO TABS   Oral   Take 7.5 mg by mouth daily.         Marland Kitchen PREDNISONE 10 MG PO TABS   Oral   Take 10 mg by mouth daily as needed. For RA         . TRAMADOL HCL 50 MG PO TABS   Oral   Take 50-100 mg by mouth every 6 (six) hours.           BP 123/89  Temp 98.6 F (37 C) (Oral)  Resp 18  SpO2 100%  LMP 08/04/2012  Physical Exam  Nursing note and vitals reviewed. Constitutional:  She is oriented to person, place, and time. She appears well-developed and well-nourished. No distress.  HENT:  Head: Normocephalic.  Cardiovascular: Normal rate and regular rhythm.   No murmur heard. Pulmonary/Chest: Effort normal and breath sounds normal. No respiratory distress. She has no wheezes. She has no rales.  Musculoskeletal: Normal range of motion. She exhibits no edema and no tenderness.  Neurological: She is alert and oriented to person, place, and time.  Skin: Skin is warm and dry. No rash noted.  Psychiatric: She has a normal mood and affect. Her behavior is normal.    ED Course  Procedures     1. Medication refill       MDM  1:20 AM patient seen and evaluated. Patient no acute distress. Patient currently without any significant complaints but requesting refill on medications to  treat her pain from RA.        Angus Seller, [place] 08/16/12 [phone]

## 2012-09-08 ENCOUNTER — Other Ambulatory Visit: Payer: Self-pay | Admitting: Obstetrics & Gynecology

## 2012-10-08 ENCOUNTER — Emergency Department (HOSPITAL_COMMUNITY)
Admission: EM | Admit: 2012-10-08 | Discharge: 2012-10-09 | Disposition: A | Discharge status: Home / Self Care | Payer: Self-pay | Attending: Emergency Medicine | Admitting: Emergency Medicine

## 2012-10-08 ENCOUNTER — Encounter (HOSPITAL_COMMUNITY): Payer: Self-pay | Admitting: Emergency Medicine

## 2012-10-08 DIAGNOSIS — Z791 Long term (current) use of non-steroidal anti-inflammatories (NSAID): Secondary | ICD-10-CM | POA: Insufficient documentation

## 2012-10-08 DIAGNOSIS — Z8742 Personal history of other diseases of the female genital tract: Secondary | ICD-10-CM | POA: Insufficient documentation

## 2012-10-08 DIAGNOSIS — Z79899 Other long term (current) drug therapy: Secondary | ICD-10-CM | POA: Insufficient documentation

## 2012-10-08 DIAGNOSIS — Z760 Encounter for issue of repeat prescription: Secondary | ICD-10-CM | POA: Insufficient documentation

## 2012-10-08 DIAGNOSIS — G8929 Other chronic pain: Secondary | ICD-10-CM

## 2012-10-08 DIAGNOSIS — Z8659 Personal history of other mental and behavioral disorders: Secondary | ICD-10-CM | POA: Insufficient documentation

## 2012-10-08 DIAGNOSIS — M069 Rheumatoid arthritis, unspecified: Secondary | ICD-10-CM | POA: Insufficient documentation

## 2012-10-08 DIAGNOSIS — E079 Disorder of thyroid, unspecified: Secondary | ICD-10-CM | POA: Insufficient documentation

## 2012-10-08 DIAGNOSIS — Z9109 Other allergy status, other than to drugs and biological substances: Secondary | ICD-10-CM | POA: Insufficient documentation

## 2012-10-08 NOTE — ED Notes (Signed)
PT. REQUESTING PRESCRIPTIONS FOR TRAMADOL / MELOXICAM / PERCOCET FOR HER ARTHRITIS .

## 2012-10-09 MED ORDER — TRAMADOL HCL 50 MG PO TABS: 50.0000 mg | ORAL_TABLET | Freq: Four times a day (QID) | ORAL | Status: DC | PRN

## 2012-10-09 MED ORDER — OXYCODONE-ACETAMINOPHEN 5-325 MG PO TABS: 1.0000 | ORAL_TABLET | ORAL | Status: DC | PRN

## 2012-10-09 MED ORDER — MELOXICAM 7.5 MG PO TABS: 7.5000 mg | ORAL_TABLET | Freq: Every day | ORAL | Status: DC

## 2012-10-09 NOTE — ED Notes (Signed)
Pt requesting a prescription refill for Tramadol 50 mg PRN, Meloxicam 7.5 mg HS, Percocet 7/325 mg HS. Pt reports she is in between doctor's and is currently out ogf her meds

## 2012-10-09 NOTE — ED Provider Notes (Signed)
History     CSN: [id number]  Arrival date & time 10/08/12  2336   None     Chief Complaint  Patient presents with  . Medication Refill    (Consider location/radiation/quality/duration/timing/severity/associated sxs/prior treatment) HPI History provided by pt and prior chart.  Pt reports that [place] here from [place] where [place] was diagnosed and treated for RA, 2 years ago.  Her Ob/Gyn had been managing for a long time but recommended f/u with rheumatology.  She has an appt w/ new PCP on 10/19/12 and will be referred at that time.  She is out her mobic, ultram and percocet and requests refills.  She has been experiencing typical pain/edema in left lateral worse than medial wrist as well as 3rd/4th fingers on L and 2nd-4th on R.  Sx worst in am.  Per prior chart, pt seen for same in 07/2012 and was prescribed refills.   Past Medical History  Diagnosis Date  . Thyroid disease   . Abnormal Pap smear   . Allergy   . Depression   . Rheumatoid arthritis   . Anxiety   . Ovarian cyst     Past Surgical History  Procedure Date  . Tubal ligation   . Laparoscopic tubal ligation     Family History  Problem Relation Age of Onset  . Diabetes Mother   . Hypertension Mother   . Alcohol abuse Paternal Grandmother   . Hypothyroidism Paternal Grandfather   . Other Neg Hx     History  Substance Use Topics  . Smoking status: Never Smoker   . Smokeless tobacco: Never Used  . Alcohol Use: No    OB History    Grav Para Term Preterm Abortions TAB SAB Ect Mult Living   4 2 1 1 2 2    2       Review of Systems  All other systems reviewed and are negative.    Allergies  Penicillins and Reglan  Home Medications   Current Outpatient Rx  Name  Route  Sig  Dispense  Refill  . FERROUS SULFATE 325 (65 FE) MG PO TABS   Oral   Take 325 mg by mouth daily with breakfast.         . LEVOTHYROXINE SODIUM 125 MCG PO TABS   Oral   Take 1 tablet (125 mcg total) by mouth daily.   30  tablet   3   . MELOXICAM 7.5 MG PO TABS   Oral   Take 1 tablet (7.5 mg total) by mouth daily.   20 tablet   0   . OXYCODONE-ACETAMINOPHEN 7.5-325 MG PO TABS   Oral   Take 1 tablet by mouth every 4 (four) hours as needed. pain         . PREDNISONE 10 MG PO TABS   Oral   Take 10 mg by mouth once as needed. For RA if no relief from meloxicam         . TRAMADOL HCL 50 MG PO TABS   Oral   Take 50-100 mg by mouth every 6 (six) hours. For pain         . MELOXICAM 7.5 MG PO TABS   Oral   Take 1 tablet (7.5 mg total) by mouth daily.   30 tablet   0   . OXYCODONE-ACETAMINOPHEN 5-325 MG PO TABS   Oral   Take 1 tablet by mouth every 4 (four) hours as needed for pain.   5 tablet  0   . TRAMADOL HCL 50 MG PO TABS   Oral   Take 1 tablet (50 mg total) by mouth every 6 (six) hours as needed for pain.   20 tablet   0     BP 124/89  Pulse 115  Temp 98.4 F (36.9 C) (Oral)  Resp 20  SpO2 100%  LMP 09/08/2012  Physical Exam  Nursing note and vitals reviewed. Constitutional: She is oriented to person, place, and time. She appears well-developed and well-nourished. No distress.  HENT:  Head: Normocephalic and atraumatic.  Eyes:       Normal appearance  Neck: Normal range of motion.  Cardiovascular: Normal rate and regular rhythm.   Pulmonary/Chest: Effort normal and breath sounds normal. No respiratory distress.  Musculoskeletal: Normal range of motion.       No edema, skin changes or tenderness of wrists or joints of fingers.    Neurological: She is alert and oriented to person, place, and time.  Skin: Skin is warm and dry. No rash noted.  Psychiatric: She has a normal mood and affect. Her behavior is normal.    ED Course  Procedures (including critical care time)  Labs Reviewed - No data to display No results found.   1. Chronic pain       MDM  32yo F w/ RA presents w/ request for refills on her chronic pain medications, including ultram, percocet and  mobic.  Seen for same in 07/2012.  She has f/u scheduled with a new PCP on 10/19/12 and hopes to be referred to rheumatology at that time.  Prescribed 30 mobic, 20 ultram and 5 percocet (taken infrequently for break-through pain).  I explained to patient that we don't generally treat chronic pain in ED and these medications will need to be filled by her PCP or rheumatologist from here on out.  I am suspicious for malingering.  She voiced her understanding.  Return precautions discussed.         Otilio Miu, PA-C 10/09/12 [phone]

## 2012-10-09 NOTE — ED Provider Notes (Signed)
Medical screening examination/treatment/procedure(s) were performed by non-physician practitioner and as supervising physician I was immediately available for consultation/collaboration.  Dang Mathison, MD 10/09/12 2307 

## 2012-10-15 ENCOUNTER — Emergency Department: Payer: Self-pay | Admitting: Emergency Medicine

## 2012-10-19 ENCOUNTER — Emergency Department (HOSPITAL_COMMUNITY)
Admission: EM | Admit: 2012-10-19 | Discharge: 2012-10-19 | Disposition: A | Discharge status: Home / Self Care | Payer: No Typology Code available for payment source | Attending: Emergency Medicine | Admitting: Emergency Medicine

## 2012-10-19 ENCOUNTER — Encounter (HOSPITAL_COMMUNITY): Payer: Self-pay | Admitting: Emergency Medicine

## 2012-10-19 ENCOUNTER — Emergency Department (HOSPITAL_COMMUNITY): Payer: No Typology Code available for payment source

## 2012-10-19 DIAGNOSIS — F411 Generalized anxiety disorder: Secondary | ICD-10-CM | POA: Insufficient documentation

## 2012-10-19 DIAGNOSIS — F3289 Other specified depressive episodes: Secondary | ICD-10-CM | POA: Insufficient documentation

## 2012-10-19 DIAGNOSIS — Z8742 Personal history of other diseases of the female genital tract: Secondary | ICD-10-CM | POA: Insufficient documentation

## 2012-10-19 DIAGNOSIS — S298XXA Other specified injuries of thorax, initial encounter: Secondary | ICD-10-CM | POA: Insufficient documentation

## 2012-10-19 DIAGNOSIS — IMO0002 Reserved for concepts with insufficient information to code with codable children: Secondary | ICD-10-CM | POA: Insufficient documentation

## 2012-10-19 DIAGNOSIS — Y92410 Unspecified street and highway as the place of occurrence of the external cause: Secondary | ICD-10-CM | POA: Insufficient documentation

## 2012-10-19 DIAGNOSIS — F329 Major depressive disorder, single episode, unspecified: Secondary | ICD-10-CM | POA: Insufficient documentation

## 2012-10-19 DIAGNOSIS — Y9389 Activity, other specified: Secondary | ICD-10-CM | POA: Insufficient documentation

## 2012-10-19 DIAGNOSIS — S161XXA Strain of muscle, fascia and tendon at neck level, initial encounter: Secondary | ICD-10-CM

## 2012-10-19 DIAGNOSIS — E079 Disorder of thyroid, unspecified: Secondary | ICD-10-CM | POA: Insufficient documentation

## 2012-10-19 DIAGNOSIS — Z8739 Personal history of other diseases of the musculoskeletal system and connective tissue: Secondary | ICD-10-CM | POA: Insufficient documentation

## 2012-10-19 DIAGNOSIS — S139XXA Sprain of joints and ligaments of unspecified parts of neck, initial encounter: Secondary | ICD-10-CM | POA: Insufficient documentation

## 2012-10-19 DIAGNOSIS — Z79899 Other long term (current) drug therapy: Secondary | ICD-10-CM | POA: Insufficient documentation

## 2012-10-19 MED ORDER — OXYCODONE-ACETAMINOPHEN 5-325 MG PO TABS: 1.0000 | ORAL_TABLET | ORAL | Status: DC | PRN

## 2012-10-19 MED ORDER — TRAMADOL HCL 50 MG PO TABS: 50.0000 mg | ORAL_TABLET | Freq: Four times a day (QID) | ORAL | Status: DC | PRN
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
## 2012-10-19 NOTE — ED Notes (Signed)
Pt escorted to discharge window. Verbalized understanding discharge instructions. In no acute distress.   
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
## 2012-10-20 NOTE — ED Provider Notes (Signed)
Medical screening examination/treatment/procedure(s) were performed by non-physician practitioner and as supervising physician I was immediately available for consultation/collaboration.   Huberta Tompkins J. Victorina Kable, MD 10/20/12 0701 

## 2012-10-21 ENCOUNTER — Emergency Department (INDEPENDENT_AMBULATORY_CARE_PROVIDER_SITE_OTHER)
Admission: EM | Admit: 2012-10-21 | Discharge: 2012-10-21 | Disposition: A | Discharge status: Home / Self Care | Payer: Self-pay | Source: Home / Self Care | Attending: Family Medicine | Admitting: Family Medicine

## 2012-10-21 ENCOUNTER — Encounter (HOSPITAL_COMMUNITY): Payer: Self-pay

## 2012-10-21 DIAGNOSIS — E039 Hypothyroidism, unspecified: Secondary | ICD-10-CM

## 2012-10-21 DIAGNOSIS — M069 Rheumatoid arthritis, unspecified: Secondary | ICD-10-CM

## 2012-10-21 DIAGNOSIS — F419 Anxiety disorder, unspecified: Secondary | ICD-10-CM

## 2012-10-21 DIAGNOSIS — F411 Generalized anxiety disorder: Secondary | ICD-10-CM

## 2012-10-21 DIAGNOSIS — E079 Disorder of thyroid, unspecified: Secondary | ICD-10-CM

## 2012-10-21 DIAGNOSIS — G8929 Other chronic pain: Secondary | ICD-10-CM

## 2012-10-21 MED ORDER — MELOXICAM 7.5 MG PO TABS: 7.5000 mg | ORAL_TABLET | Freq: Every day | ORAL | Status: DC

## 2012-10-21 MED ORDER — LEVOTHYROXINE SODIUM 175 MCG PO TABS: 175.0000 ug | ORAL_TABLET | Freq: Every day | ORAL | Status: DC

## 2012-10-21 MED ORDER — LEVOTHYROXINE SODIUM 125 MCG PO TABS: 125.0000 ug | ORAL_TABLET | Freq: Every day | ORAL | Status: DC

## 2012-10-21 MED ORDER — TRAMADOL HCL 50 MG PO TABS: 50.0000 mg | ORAL_TABLET | Freq: Four times a day (QID) | ORAL | Status: DC | PRN

## 2012-10-21 MED ORDER — OXYCODONE-ACETAMINOPHEN 5-325 MG PO TABS: 1.0000 | ORAL_TABLET | Freq: Three times a day (TID) | ORAL | Status: DC | PRN

## 2012-10-21 NOTE — ED Notes (Signed)
Patient has history of RA - needs medication refill

## 2012-10-21 NOTE — ED Provider Notes (Signed)
History    CSN: [id number]  Arrival date & time 10/21/12  1548   First MD Initiated Contact with Patient 10/21/12 1638      Chief Complaint  Patient presents with  . Medication Refill    (Consider location/radiation/quality/duration/timing/severity/associated sxs/prior treatment) HPI Pt has relocated from [facility] and has been on chronic arthritis medicines for pain.   She denies breathing medical records to review.  She reports that she has been taking tramadol daily and for breakthrough pain she has been taking oxycodone.  The patient reports that she was being prescribed her pain medications while she was pregnant through the OB/GYN Department.  She reports that when she delivered her baby she was not able to continue getting refills from her pain medications from the OB/GYN Department.  She reports that because she doesn't have medical insurance she's had a difficult time getting her medications filled.  She's gone to emergency department multiple times but they only provide her with several days of medications.  She reports that she has not been able to see a local rheumatologist because she does not have medical insurance and cannot afford to pay for an office visit up front.  The patient reports that she has hypothyroidism and takes thyroid supplementation of 175 mcg of levothyroxine daily.  She reports that she had her TSH monitored recently and was told that it was within normal limits.  She had been paying cash money to see a private physician in [place] but can no longer afford to do that.  Past Medical History  Diagnosis Date  . Thyroid disease   . Abnormal Pap smear   . Allergy   . Depression   . Rheumatoid arthritis   . Anxiety   . Ovarian cyst     Past Surgical History  Procedure Date  . Tubal ligation   . Laparoscopic tubal ligation     Family History  Problem Relation Age of Onset  . Diabetes Mother   . Hypertension Mother   . Alcohol abuse Paternal  Grandmother   . Hypothyroidism Paternal Grandfather   . Other Neg Hx     History  Substance Use Topics  . Smoking status: Never Smoker   . Smokeless tobacco: Never Used  . Alcohol Use: No    OB History    Grav Para Term Preterm Abortions TAB SAB Ect Mult Living   4 2 1 1 2 2    2       Review of Systems  Musculoskeletal: Positive for joint swelling and arthralgias.  All other systems reviewed and are negative.    Allergies  Penicillins and Reglan  Home Medications   Current Outpatient Rx  Name  Route  Sig  Dispense  Refill  . MELOXICAM 15 MG PO TABS   Oral   Take 15 mg by mouth daily.         Marland Kitchen FERROUS SULFATE 325 (65 FE) MG PO TABS   Oral   Take 325 mg by mouth daily with breakfast.         . LEVOTHYROXINE SODIUM 125 MCG PO TABS   Oral   Take 1 tablet (125 mcg total) by mouth daily.   30 tablet   3   . MELOXICAM 7.5 MG PO TABS   Oral   Take 1 tablet (7.5 mg total) by mouth daily.   20 tablet   0   . OXYCODONE-ACETAMINOPHEN 5-325 MG PO TABS   Oral   Take 1 tablet by  mouth every 4 (four) hours as needed for pain.   10 tablet   0   . OXYCODONE-ACETAMINOPHEN 5-325 MG PO TABS   Oral   Take 1 tablet by mouth every 4 (four) hours as needed for pain.   5 tablet   0   . PREDNISONE 10 MG PO TABS   Oral   Take 10 mg by mouth once as needed. For RA if no relief from meloxicam         . TRAMADOL HCL 50 MG PO TABS   Oral   Take 50-100 mg by mouth every 6 (six) hours. For pain         . TRAMADOL HCL 50 MG PO TABS   Oral   Take 1 tablet (50 mg total) by mouth every 6 (six) hours as needed for pain.   15 tablet   0     BP 111/82  Pulse 89  Temp 98 F (36.7 C) (Oral)  SpO2 100%  LMP 09/06/2012  Physical Exam  Nursing note and vitals reviewed. Constitutional: She is oriented to person, place, and time. She appears well-developed and well-nourished. No distress.  HENT:  Head: Normocephalic and atraumatic.  Eyes: Conjunctivae normal and  EOM are normal. Pupils are equal, round, and reactive to light.  Neck: Normal range of motion. Neck supple.  Cardiovascular: Normal rate and regular rhythm.   Pulmonary/Chest: Effort normal.  Musculoskeletal: She exhibits edema and tenderness.  Neurological: She is alert and oriented to person, place, and time. Coordination normal.  Skin: Skin is warm and dry. No erythema.  Psychiatric: She has a normal mood and affect. Her behavior is normal. Judgment and thought content normal.    ED Course  Procedures (including critical care time)  Labs Reviewed - No data to display No results found.   No diagnosis found.   MDM  IMPRESSION  Hypothyroidism  Rheumatoid Arthritis  Chronic Pain  RECOMMENDATIONS / PLAN Pt says she just had her Blood work checked at another office ([place])  Requesting medical records from that office.  Requesting records from her rheumatologist office from [facility].   Refilled her chronic medications.   I would like to review her records from OB/GYN as well  FOLLOW UP 2 months   The patient was given clear instructions to go to ER or return to medical center if symptoms don't improve, worsen or new problems develop.  The patient verbalized understanding.  The patient was told to call to get lab results if they haven't heard anything in the next week.            Cleora Fleet, MD 10/21/12 [phone]

## 2012-10-31 ENCOUNTER — Emergency Department (HOSPITAL_COMMUNITY)
Admission: EM | Admit: 2012-10-31 | Discharge: 2012-10-31 | Disposition: A | Discharge status: Home / Self Care | Payer: Self-pay | Attending: Emergency Medicine | Admitting: Emergency Medicine

## 2012-10-31 ENCOUNTER — Encounter (HOSPITAL_COMMUNITY): Payer: Self-pay | Admitting: Emergency Medicine

## 2012-10-31 DIAGNOSIS — M069 Rheumatoid arthritis, unspecified: Secondary | ICD-10-CM | POA: Insufficient documentation

## 2012-10-31 DIAGNOSIS — Z760 Encounter for issue of repeat prescription: Secondary | ICD-10-CM | POA: Insufficient documentation

## 2012-10-31 DIAGNOSIS — F411 Generalized anxiety disorder: Secondary | ICD-10-CM | POA: Insufficient documentation

## 2012-10-31 DIAGNOSIS — Z79899 Other long term (current) drug therapy: Secondary | ICD-10-CM | POA: Insufficient documentation

## 2012-10-31 DIAGNOSIS — Z8659 Personal history of other mental and behavioral disorders: Secondary | ICD-10-CM | POA: Insufficient documentation

## 2012-10-31 DIAGNOSIS — Z8742 Personal history of other diseases of the female genital tract: Secondary | ICD-10-CM | POA: Insufficient documentation

## 2012-10-31 DIAGNOSIS — E079 Disorder of thyroid, unspecified: Secondary | ICD-10-CM | POA: Insufficient documentation

## 2012-10-31 DIAGNOSIS — R110 Nausea: Secondary | ICD-10-CM | POA: Insufficient documentation

## 2012-10-31 DIAGNOSIS — M79609 Pain in unspecified limb: Secondary | ICD-10-CM | POA: Insufficient documentation

## 2012-10-31 MED ORDER — LEVOTHYROXINE SODIUM 175 MCG PO TABS: 175.0000 ug | ORAL_TABLET | Freq: Every day | ORAL | Status: DC

## 2012-10-31 MED ORDER — OXYCODONE-ACETAMINOPHEN 5-325 MG PO TABS
2.0000 | ORAL_TABLET | Freq: Once | ORAL | Status: AC
  Administered 2012-10-31: 2 via ORAL
  Filled 2012-10-31: qty 2

## 2012-10-31 MED ORDER — MELOXICAM 7.5 MG PO TABS: 7.5000 mg | ORAL_TABLET | Freq: Every day | ORAL | Status: DC

## 2012-10-31 MED ORDER — TRAMADOL HCL 50 MG PO TABS: 50.0000 mg | ORAL_TABLET | Freq: Three times a day (TID) | ORAL | Status: DC | PRN

## 2012-10-31 NOTE — ED Provider Notes (Signed)
History   This chart was scribed for Marlon Pel PA-C, a non-physician practitioner working with No att. providers found by Lewanda Rife, ED Scribe. This patient was seen in room WTR7/WTR7 and the patient's care was started at 10:40 pm.   CSN: [id number]  Arrival date & time 10/31/12  2044   First MD Initiated Contact with Patient 10/31/12 2217      Chief Complaint  Patient presents with  . Medication Refill    (Consider location/radiation/quality/duration/timing/severity/associated sxs/prior treatment) HPI Dana Elliott is a 32 y.o. female who presents to the Emergency Department complaining of medication refill. Pt reports she filled her medications 2 weeks ago and 'forgot' medications in hotel room in [place], [place] 3 days ago. Pt went PCP and he did not fill prescriptions. Pt reports anxiety, constant moderate pain in hands from arthritis, and nausea. Pt denies diarrhea. Pt states she needs Ultram, Percocet, Mobic, and Synthroid. Pt reports history of rheumatoid arthritis.    Past Medical History  Diagnosis Date  . Thyroid disease   . Abnormal Pap smear   . Allergy   . Depression   . Rheumatoid arthritis   . Anxiety   . Ovarian cyst     Past Surgical History  Procedure Date  . Tubal ligation   . Laparoscopic tubal ligation     Family History  Problem Relation Age of Onset  . Diabetes Mother   . Hypertension Mother   . Alcohol abuse Paternal Grandmother   . Hypothyroidism Paternal Grandfather   . Other Neg Hx     History  Substance Use Topics  . Smoking status: Never Smoker   . Smokeless tobacco: Never Used  . Alcohol Use: No    OB History    Grav Para Term Preterm Abortions TAB SAB Ect Mult Living   4 2 1 1 2 2    2       Review of Systems  Constitutional: Negative.   HENT: Negative.   Respiratory: Negative.   Cardiovascular: Negative.   Gastrointestinal: Negative.   Musculoskeletal: Positive for arthralgias.  Skin: Negative.   Neurological:  Negative.   Hematological: Negative.   Psychiatric/Behavioral: Negative.   All other systems reviewed and are negative.  A complete 10 system review of systems was obtained and all systems are negative except as noted in the HPI and PMH.     Allergies  Penicillins and Reglan  Home Medications   Current Outpatient Rx  Name  Route  Sig  Dispense  Refill  . FERROUS SULFATE 325 (65 FE) MG PO TABS   Oral   Take 325 mg by mouth daily with breakfast.         . OXYCODONE-ACETAMINOPHEN 5-325 MG PO TABS   Oral   Take 1 tablet by mouth every 8 (eight) hours as needed for pain.   40 tablet   0   . LEVOTHYROXINE SODIUM 175 MCG PO TABS   Oral   Take 1 tablet (175 mcg total) by mouth daily.   30 tablet   0   . MELOXICAM 7.5 MG PO TABS   Oral   Take 1 tablet (7.5 mg total) by mouth daily.   30 tablet   2   . TRAMADOL HCL 50 MG PO TABS   Oral   Take 1 tablet (50 mg total) by mouth every 8 (eight) hours as needed for pain.   30 tablet   1     BP 116/83  Pulse 102  Temp 98.5 F (  36.9 C) (Oral)  Resp 20  SpO2 100%  LMP 10/07/2012  Physical Exam  Nursing note and vitals reviewed. Constitutional: She is oriented to person, place, and time. She appears well-developed and well-nourished. No distress.  HENT:  Head: Normocephalic and atraumatic.  Eyes: EOM are normal.  Neck: Neck supple. No tracheal deviation present.  Cardiovascular: Normal rate.   Pulmonary/Chest: Effort normal. No respiratory distress.  Abdominal: Soft.  Musculoskeletal: Normal range of motion.  Neurological: She is alert and oriented to person, place, and time.  Skin: Skin is warm and dry.  Psychiatric: She has a normal mood and affect. Her behavior is normal.    ED Course  Procedures (including critical care time)  Medications  traMADol (ULTRAM) 50 MG tablet (not administered)  meloxicam (MOBIC) 7.5 MG tablet (not administered)  levothyroxine (SYNTHROID, LEVOTHROID) 175 MCG tablet (not  administered)  oxyCODONE-acetaminophen (PERCOCET/ROXICET) 5-325 MG per tablet 2 tablet (2 tablet Oral Given 10/31/12 2254)    Labs Reviewed - No data to display No results found.   1. Medication refill   2. Thyroid disorder       MDM   Pt has been advised of the symptoms that warrant their return to the ED. Patient has voiced understanding and has agreed to follow-up with the PCP or specialist.   I personally performed the services described in this documentation, which was scribed in my presence. The recorded information has been reviewed and is accurate.   Dorthula Matas, PA 11/01/12 0025

## 2012-10-31 NOTE — ED Notes (Signed)
Pt states she left her medications in a hotel room and when she went back to get them they were gone  Pt states she last took her medications on Saturday

## 2012-11-01 NOTE — Progress Notes (Signed)
Pt presented to the clinic on 10/31/12 and spoke with nurse Arvin Collard, LPN requesting new prescriptions for all of her meds.  She said that she had gone to [place] and she left her prescriptions in her hotel room.  She was requesting that they all be filled especially the percocet and tramadol.   I checked on the [place] drug database and learned that the patient had filled her prescription for oxycodone on 10/21/12 the very same day that it was prescribed and that her other prescriptions had all been sent to the pharmacy electronically.  Therefore i'm not going to write another prescription for the controlled substances because the patient has been untruthful and not forthcoming about her use of these medications.  In addition, we still haven't received any medical records as we had requested at her last OV to confirm her diagnosis and the history that she reports that she had been prescribed these medications by her former rheumatologist.     Rodney Langton, MD, CDE, FAAFP [facility] [place], [place]

## 2012-11-02 ENCOUNTER — Telehealth: Payer: Self-pay

## 2012-11-02 MED ORDER — TRAMADOL HCL 50 MG PO TABS: 50.0000 mg | ORAL_TABLET | Freq: Four times a day (QID) | ORAL | Status: DC | PRN

## 2012-11-02 MED ORDER — OXYCODONE-ACETAMINOPHEN 5-325 MG PO TABS: 1.0000 | ORAL_TABLET | Freq: Three times a day (TID) | ORAL | Status: DC | PRN

## 2012-11-02 NOTE — ED Notes (Signed)
Patient has an appt. 11/21/12 @ 10:30 am Stacey Drain MD

## 2012-11-02 NOTE — Telephone Encounter (Signed)
Faxed records to Dr. Henriette Combs office to [phone]. Patient records request by patient and office.

## 2012-11-02 NOTE — ED Provider Notes (Signed)
Medical screening examination/treatment/procedure(s) were performed by non-physician practitioner and as supervising physician I was immediately available for consultation/collaboration.  Emmett Arntz R. Dajion Bickford, MD 11/02/12 1454 

## 2012-11-03 NOTE — Progress Notes (Signed)
Pt came into the office today reporting that she has been having symptoms of withdrawal from not having her percocet.  She reported that her medication was left at a hotel in the [place] area where she had been traveling for her grandmother's funeral.  I met her with our Production designer, theatre/television/film.  I was concerned because I still had not received any medical records from any of the providers that she says she had been to.  We learned that she had seen a family doctor in [place] And she had not really seen a rheumatologist as she had told me on her initial visit.  We got her an appt to see a local rheumatologist in the next 3 weeks and refilled her meds until she can be seen and diagnosed by the rheumatologist.    Maryln Manuel, MD

## 2012-11-13 ENCOUNTER — Inpatient Hospital Stay (HOSPITAL_COMMUNITY): Payer: Self-pay

## 2012-11-13 ENCOUNTER — Encounter (HOSPITAL_COMMUNITY): Payer: Self-pay | Admitting: Obstetrics and Gynecology

## 2012-11-13 ENCOUNTER — Inpatient Hospital Stay (HOSPITAL_COMMUNITY)
Admission: AD | Admit: 2012-11-13 | Discharge: 2012-11-13 | Disposition: A | Discharge status: Home / Self Care | Payer: Self-pay | Source: Ambulatory Visit | Attending: Obstetrics & Gynecology | Admitting: Obstetrics & Gynecology

## 2012-11-13 DIAGNOSIS — M129 Arthropathy, unspecified: Secondary | ICD-10-CM | POA: Insufficient documentation

## 2012-11-13 DIAGNOSIS — A499 Bacterial infection, unspecified: Secondary | ICD-10-CM | POA: Insufficient documentation

## 2012-11-13 DIAGNOSIS — N760 Acute vaginitis: Secondary | ICD-10-CM | POA: Insufficient documentation

## 2012-11-13 DIAGNOSIS — N949 Unspecified condition associated with female genital organs and menstrual cycle: Secondary | ICD-10-CM | POA: Insufficient documentation

## 2012-11-13 DIAGNOSIS — R109 Unspecified abdominal pain: Secondary | ICD-10-CM | POA: Insufficient documentation

## 2012-11-13 DIAGNOSIS — B9689 Other specified bacterial agents as the cause of diseases classified elsewhere: Secondary | ICD-10-CM

## 2012-11-13 LAB — WET PREP, GENITAL: Trich, Wet Prep: NONE SEEN

## 2012-11-13 LAB — URINALYSIS, ROUTINE W REFLEX MICROSCOPIC
Bilirubin Urine: NEGATIVE
Glucose, UA: NEGATIVE mg/dL
Hgb urine dipstick: NEGATIVE
Ketones, ur: 15 mg/dL — AB
Specific Gravity, Urine: >1.03 — ABNORMAL HIGH (ref 1.005–1.030)
pH: 6 (ref 5.0–8.0)

## 2012-11-13 MED ORDER — METRONIDAZOLE 500 MG PO TABS: 500.0000 mg | ORAL_TABLET | Freq: Two times a day (BID) | ORAL | Status: DC

## 2012-11-13 MED ORDER — KETOROLAC TROMETHAMINE 60 MG/2ML IM SOLN
60.0000 mg | Freq: Once | INTRAMUSCULAR | Status: DC
  Filled 2012-11-13: qty 2

## 2012-11-13 MED ORDER — TRAMADOL HCL 50 MG PO TABS
50.0000 mg | ORAL_TABLET | Freq: Once | ORAL | Status: AC
  Administered 2012-11-13: 50 mg via ORAL
  Filled 2012-11-13: qty 1

## 2012-11-13 MED ORDER — TRAMADOL HCL 50 MG PO TABS: 50.0000 mg | ORAL_TABLET | Freq: Four times a day (QID) | ORAL | Status: DC | PRN
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
## 2012-11-13 NOTE — MAU Note (Signed)
Presents with lower abdominal pain with vaginal discharge and odor. She has a history of an ovarian cyst and bacterial vaginosis. She's had a tubal ligation.

## 2012-11-14 LAB — GC/CHLAMYDIA PROBE AMP
CT Probe RNA: NEGATIVE
GC Probe RNA: NEGATIVE

## 2012-11-15 ENCOUNTER — Emergency Department: Payer: Self-pay | Admitting: Emergency Medicine

## 2012-11-16 ENCOUNTER — Emergency Department (HOSPITAL_COMMUNITY): Payer: Self-pay

## 2012-11-16 ENCOUNTER — Emergency Department (HOSPITAL_COMMUNITY)
Admission: EM | Admit: 2012-11-16 | Discharge: 2012-11-17 | Disposition: A | Discharge status: Home / Self Care | Payer: Self-pay | Attending: Emergency Medicine | Admitting: Emergency Medicine

## 2012-11-16 ENCOUNTER — Encounter (HOSPITAL_COMMUNITY): Payer: Self-pay | Admitting: Emergency Medicine

## 2012-11-16 DIAGNOSIS — F411 Generalized anxiety disorder: Secondary | ICD-10-CM | POA: Insufficient documentation

## 2012-11-16 DIAGNOSIS — S63509A Unspecified sprain of unspecified wrist, initial encounter: Secondary | ICD-10-CM | POA: Insufficient documentation

## 2012-11-16 DIAGNOSIS — E079 Disorder of thyroid, unspecified: Secondary | ICD-10-CM | POA: Insufficient documentation

## 2012-11-16 DIAGNOSIS — Z8739 Personal history of other diseases of the musculoskeletal system and connective tissue: Secondary | ICD-10-CM | POA: Insufficient documentation

## 2012-11-16 DIAGNOSIS — W010XXA Fall on same level from slipping, tripping and stumbling without subsequent striking against object, initial encounter: Secondary | ICD-10-CM | POA: Insufficient documentation

## 2012-11-16 DIAGNOSIS — F3289 Other specified depressive episodes: Secondary | ICD-10-CM | POA: Insufficient documentation

## 2012-11-16 DIAGNOSIS — Y92009 Unspecified place in unspecified non-institutional (private) residence as the place of occurrence of the external cause: Secondary | ICD-10-CM | POA: Insufficient documentation

## 2012-11-16 DIAGNOSIS — Y9389 Activity, other specified: Secondary | ICD-10-CM | POA: Insufficient documentation

## 2012-11-16 DIAGNOSIS — Z8742 Personal history of other diseases of the female genital tract: Secondary | ICD-10-CM | POA: Insufficient documentation

## 2012-11-16 DIAGNOSIS — Z79899 Other long term (current) drug therapy: Secondary | ICD-10-CM | POA: Insufficient documentation

## 2012-11-16 MED ORDER — TRAMADOL HCL 50 MG PO TABS: 50.0000 mg | ORAL_TABLET | Freq: Four times a day (QID) | ORAL | Status: DC | PRN

## 2012-11-16 MED ORDER — TRAMADOL HCL 50 MG PO TABS
50.0000 mg | ORAL_TABLET | Freq: Once | ORAL | Status: AC
  Administered 2012-11-17: 50 mg via ORAL
  Filled 2012-11-16: qty 1

## 2012-11-16 MED ORDER — IBUPROFEN 800 MG PO TABS
800.0000 mg | ORAL_TABLET | Freq: Once | ORAL | Status: AC
  Administered 2012-11-17: 800 mg via ORAL
  Filled 2012-11-16: qty 1

## 2012-11-16 NOTE — ED Provider Notes (Signed)
History     CSN: [id number]  Arrival date & time 11/16/12  2208   First MD Initiated Contact with Patient 11/16/12 2314      Chief Complaint  Patient presents with  . Hand Pain    (Consider location/radiation/quality/duration/timing/severity/associated sxs/prior treatment) HPI Comments: 32 year old female with a history of rheumatoid arthritis presents to the emergency department complaining of left hand and wrist pain after falling around lunchtime today. States she was reaching to get something out of her cabinet in her kitchen when she slipped and tried to catch herself with her left hand causing pain. Initially she felt fine, however throughout the day she began to have a dull throb on her wrist. Pain is constant, worse with movement, radiating through her hand rated 8/10. She tried taking tramadol without any relief. States her wrist tends to swell on occasion due to rheumatoid arthritis, however swelling is a little more severe. Denies numbness or tingling in her extremity.  Patient is a 32 y.o. female presenting with hand pain. The history is provided by the patient.  Hand Pain Associated symptoms include joint swelling. Pertinent negatives include no numbness.    Past Medical History  Diagnosis Date  . Thyroid disease   . Abnormal Pap smear   . Allergy   . Depression   . Rheumatoid arthritis   . Anxiety   . Ovarian cyst     Past Surgical History  Procedure Laterality Date  . Tubal ligation    . Laparoscopic tubal ligation      Family History  Problem Relation Age of Onset  . Diabetes Mother   . Hypertension Mother   . Alcohol abuse Paternal Grandmother   . Hypothyroidism Paternal Grandfather   . Other Neg Hx     History  Substance Use Topics  . Smoking status: Never Smoker   . Smokeless tobacco: Never Used  . Alcohol Use: No    OB History   Grav Para Term Preterm Abortions TAB SAB Ect Mult Living   4 2 1 1 2 2    2       Review of Systems    Musculoskeletal: Positive for joint swelling.       Positive for left wrist and hand pain.  Skin: Negative for color change and wound.  Neurological: Negative for numbness.  All other systems reviewed and are negative.    Allergies  Penicillins and Reglan  Home Medications   Current Outpatient Rx  Name  Route  Sig  Dispense  Refill  . ferrous sulfate 325 (65 FE) MG tablet   Oral   Take 325 mg by mouth daily with breakfast.         . levothyroxine (SYNTHROID, LEVOTHROID) 175 MCG tablet   Oral   Take 1 tablet (175 mcg total) by mouth daily.   30 tablet   0   . meloxicam (MOBIC) 7.5 MG tablet   Oral   Take 1 tablet (7.5 mg total) by mouth daily.   30 tablet   2   . metroNIDAZOLE (FLAGYL) 500 MG tablet   Oral   Take 1 tablet (500 mg total) by mouth 2 (two) times daily.   14 tablet   0   . traMADol (ULTRAM) 50 MG tablet   Oral   Take 1 tablet (50 mg total) by mouth every 6 (six) hours as needed for pain.   30 tablet   0     BP 125/81  Pulse 84  Temp(Src) 98.3 F (  36.8 C) (Oral)  Resp 16  SpO2 100%  LMP 11/07/2012  Physical Exam  Nursing note and vitals reviewed. Constitutional: She is oriented to person, place, and time. She appears well-developed and well-nourished. No distress.  HENT:  Head: Normocephalic and atraumatic.  Mouth/Throat: Oropharynx is clear and moist.  Eyes: Conjunctivae are normal.  Neck: Normal range of motion. Neck supple.  Cardiovascular: Normal rate, regular rhythm, normal heart sounds and intact distal pulses.   Pulses:      Radial pulses are 2+ on the left side.  Pulmonary/Chest: Effort normal and breath sounds normal.  Musculoskeletal: Normal range of motion.       Left wrist: She exhibits tenderness, bony tenderness (ulnar aspect) and swelling (mild). She exhibits normal range of motion, no effusion and no deformity.       Left hand: She exhibits tenderness. She exhibits normal range of motion and normal capillary refill.  Normal sensation noted.       Hands: Neurological: She is alert and oriented to person, place, and time. She has normal strength. No sensory deficit.  Skin: Skin is warm, dry and intact. No bruising and no ecchymosis noted.  Psychiatric: She has a normal mood and affect. Her behavior is normal.    ED Course  Procedures (including critical care time)  Labs Reviewed - No data to display No results found.   1. Wrist sprain       MDM  32 y/o female with wrist sprain. No acute abnormality seen on xray. Patient immediately requeted pain medication upon entering room. After looking her up on the controlled substance database she is frequently prescribed pain medication. I will give her a tramadol in the ER and 10 at discharge, but advised her to take tylenol or motrin. RICE discussed. Wrist velcro splint given. Resource list given for f/u.        Trevor Mace, PA-C 11/16/12 2356

## 2012-11-16 NOTE — ED Notes (Signed)
Pt states she fell today and injured her left hand   Pt states since she fell she has been having shooting pain in her hand  Pt states she has noticed a little swelling in her hand as well

## 2012-11-17 NOTE — ED Provider Notes (Signed)
Medical screening examination/treatment/procedure(s) were performed by non-physician practitioner and as supervising physician I was immediately available for consultation/collaboration.  [place] Frieden, MD 11/17/12 0701 

## 2012-11-28 ENCOUNTER — Inpatient Hospital Stay (HOSPITAL_COMMUNITY)
Admission: AD | Admit: 2012-11-28 | Discharge: 2012-11-28 | Disposition: A | Discharge status: Home / Self Care | Payer: Self-pay | Source: Ambulatory Visit | Attending: Family Medicine | Admitting: Family Medicine

## 2012-11-28 DIAGNOSIS — R109 Unspecified abdominal pain: Secondary | ICD-10-CM | POA: Insufficient documentation

## 2012-11-28 DIAGNOSIS — M069 Rheumatoid arthritis, unspecified: Secondary | ICD-10-CM | POA: Insufficient documentation

## 2012-11-28 DIAGNOSIS — N949 Unspecified condition associated with female genital organs and menstrual cycle: Secondary | ICD-10-CM | POA: Insufficient documentation

## 2012-11-28 LAB — URINALYSIS, ROUTINE W REFLEX MICROSCOPIC
Bilirubin Urine: NEGATIVE
Ketones, ur: NEGATIVE mg/dL
Nitrite: NEGATIVE
Protein, ur: NEGATIVE mg/dL
Urobilinogen, UA: 0.2 mg/dL (ref 0.0–1.0)
pH: 5.5 (ref 5.0–8.0)

## 2012-11-28 MED ORDER — METRONIDAZOLE 500 MG PO TABS: 500.0000 mg | ORAL_TABLET | Freq: Once | ORAL | Status: AC

## 2012-11-28 MED ORDER — TRAMADOL HCL 50 MG PO TABS
50.0000 mg | ORAL_TABLET | Freq: Four times a day (QID) | ORAL | Status: DC | PRN
Start: 2012-11-28 — End: 2012-11-29

## 2012-11-28 MED ORDER — METRONIDAZOLE 500 MG PO TABS: 500.0000 mg | ORAL_TABLET | Freq: Two times a day (BID) | ORAL | Status: DC

## 2012-11-28 NOTE — MAU Note (Signed)
Patient states she is out of Tramadol can't get it filled at [facility] because they told her she needs to see a rheumatologist and she can't because she doesn't have insurance.
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
## 2012-11-28 NOTE — MAU Provider Note (Signed)
I was asked to come down and see pt, after she became upset and angry with her provider.  She reports symptoms of withdrawal when taking less than 8 Tramadol/day.  Discussed drug use/drug seeking behavior, and all issues related to that.  Also, discussed how her pain was being treated but not the underlying cause.  She has previously been instructed to see Rheumatology, which she suggests she cannot afford.  She states that she requires $ up front.  Have advised her to go to [facility] instead.  Discussed long-term sequelae of RA and the disability associated with it if not treated properly.   She has agreed to wean herself off Ultram.  Reports taking 8/day, advised to decrease by one pill/day x 1 wk until she is off this medication entirely.  Pt. Is tearful and upset but is reassured that this is not personal against her.  We also, discussed money used to buy drugs, could be spent to see the appropriate MD.  We discussed MTX treatment (which is likely very cheap, but requires monitoring) as well as immune modulators (which can be gotten from drug companies for pt's lacking resources.) Every attempt was made to persuade pt that continuing with this behavior is not acceptable and she has to make an honest attempt at seeking care.

## 2012-11-28 NOTE — MAU Note (Signed)
Dr. Shawnie Pons into see patient.

## 2012-11-28 NOTE — MAU Note (Signed)
Patient was brought back to room 2 after meeting with Domenick Bookbinder RN house coverage, will call Dr. Shawnie Pons to come see patient.

## 2012-11-28 NOTE — MAU Provider Note (Signed)
Chart reviewed and agree with management and plan.  

## 2012-11-28 NOTE — MAU Note (Signed)
Lower abdominal cramping x2 weeks. Diagnosed with BV on 2/16 in MAU. Was prescribed flagyl & tramadol. Was taking flagyl x3 days but skipped 3 days before finishing the course. States now has the same symptoms as then. White malodorous discharge that has improved since last visit. Came today for refill for flagyl and tramadol. States normally takes tramadol for arthritis but currently doesn't have a rheumatologist.

## 2012-11-29 MED ORDER — TRAMADOL HCL 50 MG PO TABS
50.0000 mg | ORAL_TABLET | Freq: Four times a day (QID) | ORAL | Status: DC | PRN
Start: 2012-11-29 — End: 2013-01-02

## 2012-11-29 NOTE — MAU Provider Note (Signed)
Pt called house coverage RN. Rx not at pharmacy. Dorathy Kinsman, CNM notified. Rx did not successfully print or E-Rx. Verified w/ Dr. Shawnie Pons that she intended to Rx 150 tabs, w/ 1 RF. E-Rx resent.  [place], CNM 11/29/2012 12:25 AM

## 2012-12-01 ENCOUNTER — Telehealth: Payer: Self-pay | Admitting: *Deleted

## 2012-12-01 NOTE — Telephone Encounter (Signed)
Message copied by Barbara Cower on Thu Dec 01, 2012  4:53 PM ------      Message from: Reva Bores      Created: Wed Nov 30, 2012  4:41 PM       I gave her the number to call for appointment on her check out paper work--please look there--and have her call and make an appointment, or you can call for her--it's for rheumatoid arthritis.      I see that she was given 150 with 1 refill--Call the [facility] and see if that is what they filled.  Her instructions are to take up to 8 pills daily this week, then decrease by one pill daily, weekly so that next week she takes 7 pills/daily, the next week 6 pills/daily and so on until she is down to 0 pills/daily.  With that taper she should need 252.  This amount plus 1 refill should cover her.  I will send to Inetta Fermo too so she can re-enforce this with her.      ----- Message -----         From: Harlene Salts         Sent: 11/30/2012   2:52 PM           To: Reva Bores, MD            Hi Dr. Shawnie Pons,             So this patient called today twice. I saw that she was seen at the hospital by [place] and you. She is calling because apparently their was a miss understanding with her medicine. She said she didn't get the amount you told her with the correct instructions and also not enough refills. The pharmacist said to call them and they can just adjust the medicine so she wont run out before the due time. Also she said something about needing a referral to a clinic you sent her too. Please let me know what you need me to do in terms of this referral she is requesting, thanks!        ------

## 2012-12-01 NOTE — Telephone Encounter (Signed)
I called [facility] and confirmed patient received 150 tablets of tramadol with one refill.  Darl Pikes confirmed with patient the referral information and the proper way to be taking her medication so that it will last as directed.

## 2012-12-08 ENCOUNTER — Other Ambulatory Visit: Payer: Self-pay | Admitting: Obstetrics & Gynecology

## 2012-12-11 ENCOUNTER — Encounter (HOSPITAL_COMMUNITY): Payer: Self-pay | Admitting: Emergency Medicine

## 2012-12-11 ENCOUNTER — Emergency Department (HOSPITAL_COMMUNITY)
Admission: EM | Admit: 2012-12-11 | Discharge: 2012-12-11 | Disposition: A | Discharge status: Home / Self Care | Payer: Self-pay | Attending: Emergency Medicine | Admitting: Emergency Medicine

## 2012-12-11 DIAGNOSIS — H6690 Otitis media, unspecified, unspecified ear: Secondary | ICD-10-CM | POA: Insufficient documentation

## 2012-12-11 DIAGNOSIS — H9209 Otalgia, unspecified ear: Secondary | ICD-10-CM | POA: Insufficient documentation

## 2012-12-11 DIAGNOSIS — Z8739 Personal history of other diseases of the musculoskeletal system and connective tissue: Secondary | ICD-10-CM | POA: Insufficient documentation

## 2012-12-11 DIAGNOSIS — R0989 Other specified symptoms and signs involving the circulatory and respiratory systems: Secondary | ICD-10-CM | POA: Insufficient documentation

## 2012-12-11 DIAGNOSIS — E079 Disorder of thyroid, unspecified: Secondary | ICD-10-CM | POA: Insufficient documentation

## 2012-12-11 DIAGNOSIS — J069 Acute upper respiratory infection, unspecified: Secondary | ICD-10-CM | POA: Insufficient documentation

## 2012-12-11 DIAGNOSIS — R062 Wheezing: Secondary | ICD-10-CM | POA: Insufficient documentation

## 2012-12-11 DIAGNOSIS — Z8742 Personal history of other diseases of the female genital tract: Secondary | ICD-10-CM | POA: Insufficient documentation

## 2012-12-11 DIAGNOSIS — J3489 Other specified disorders of nose and nasal sinuses: Secondary | ICD-10-CM | POA: Insufficient documentation

## 2012-12-11 DIAGNOSIS — Z8659 Personal history of other mental and behavioral disorders: Secondary | ICD-10-CM | POA: Insufficient documentation

## 2012-12-11 DIAGNOSIS — H9210 Otorrhea, unspecified ear: Secondary | ICD-10-CM | POA: Insufficient documentation

## 2012-12-11 MED ORDER — BENZONATATE 100 MG PO CAPS: 100.0000 mg | ORAL_CAPSULE | Freq: Three times a day (TID) | ORAL | Status: DC

## 2012-12-11 MED ORDER — ACETAMINOPHEN 325 MG PO TABS
650.0000 mg | ORAL_TABLET | Freq: Once | ORAL | Status: DC
  Filled 2012-12-11: qty 2

## 2012-12-11 MED ORDER — ANTIPYRINE-BENZOCAINE 5.4-1.4 % OT SOLN
3.0000 [drp] | Freq: Once | OTIC | Status: AC
  Administered 2012-12-11: 4 [drp] via OTIC
  Filled 2012-12-11: qty 10

## 2012-12-11 MED ORDER — AZITHROMYCIN 250 MG PO TABS: ORAL_TABLET | ORAL | Status: DC

## 2012-12-11 MED ORDER — ANTIPYRINE-BENZOCAINE 5.4-1.4 % OT SOLN: 3.0000 [drp] | OTIC | Status: DC | PRN

## 2012-12-11 MED ORDER — IBUPROFEN 800 MG PO TABS
800.0000 mg | ORAL_TABLET | Freq: Once | ORAL | Status: AC
  Administered 2012-12-11: 800 mg via ORAL
  Filled 2012-12-11: qty 1

## 2012-12-11 NOTE — ED Notes (Addendum)
Upon being escorted off premises, pt yelling at person in lobby, "Don't ever come here, they don't care, it's the worse hospital ever." Upon leaving parking lot pt stops, blows horn for several seconds and "Flips off" (using middle left finger) to  the [employer] on duty and security.

## 2012-12-11 NOTE — ED Notes (Signed)
Dana Elliott, Publishing rights manager went to assess pain level. Pt accusing doctor of not treating her pain due to her history in her chart of pain meds for "my arthritis" and we are neglecting her by not treating her pain. Pt yelling at nurses stating, "I have had 2 busted ear durms and they always give me antibiotics and pain meds." When asked type of pain med, "Percocet and other things" She immediately started to accuse the nurses of looking at each other and she felt we were accusing her of drug seeking. Pt becoming loud and disruptive, escorted off premises by [employer] on duty office.

## 2012-12-11 NOTE — ED Notes (Signed)
EDP notified that patient holding her left ear and crying uncontrollably about ear pain. No new orders and patient is to be discharged.

## 2012-12-11 NOTE — ED Notes (Signed)
Spoke to patient about discharge. Offered Tylenol for additional pain control, patient refused. Patient is asking for "something stronger". Informed patient of the need to fill antibiotic prescription to help with the infection to reduce pain.

## 2012-12-11 NOTE — ED Notes (Signed)
Pt alert, arrives from home, c/o URI, left ear drainage, onset a few days ago, resp even unlabored, skin pwd, dry npc noted

## 2012-12-11 NOTE — ED Provider Notes (Signed)
History    CSN: [id number] Arrival date & time 12/11/12  7829 First MD Initiated Contact with Patient 12/11/12 [phone]      Chief Complaint  Patient presents with  . Ear Drainage  . URI    HPI Comments: Patient presents to emergency room with complaints of URI type symptoms for the last few days. She has been trying over-the-counter medications but it has not helped. She spoke with the pharmacist who told her that her cold she go away within a couple of days. Her symptoms or worsening. She felt like she was having some wheezing when she was breathing. She also noted that she started having congestion, decreased hearing in her left ear and now pain this morning t  Patient is a 32 y.o. female presenting with URI. The history is provided by the patient.  URI Presenting symptoms: congestion, ear pain and rhinorrhea   Presenting symptoms: no fever and no sore throat   Congestion:    Location:  Nasal and chest   Interferes with sleep: yes     Interferes with eating/drinking: no   Ear pain:    Location:  Left   Severity:  Severe   Onset quality:  Gradual   Duration:  1 day   Progression:  Worsening   Chronicity:  New Severity:  Mild   Past Medical History  Diagnosis Date  . Thyroid disease   . Abnormal Pap smear   . Allergy   . Depression   . Rheumatoid arthritis   . Anxiety   . Ovarian cyst     Past Surgical History  Procedure Laterality Date  . Tubal ligation    . Laparoscopic tubal ligation      Family History  Problem Relation Age of Onset  . Diabetes Mother   . Hypertension Mother   . Alcohol abuse Paternal Grandmother   . Hypothyroidism Paternal Grandfather   . Other Neg Hx     History  Substance Use Topics  . Smoking status: Never Smoker   . Smokeless tobacco: Never Used  . Alcohol Use: No    OB History   Grav Para Term Preterm Abortions TAB SAB Ect Mult Living   4 2 1 1 2 2    2       Review of Systems  Constitutional: Negative for fever.  HENT:  Positive for ear pain, congestion and rhinorrhea. Negative for sore throat.   All other systems reviewed and are negative.    Allergies  Penicillins and Reglan  Home Medications   Current Outpatient Rx  Name  Route  Sig  Dispense  Refill  . ibuprofen (ADVIL,MOTRIN) 200 MG tablet   Oral   Take 600 mg by mouth every 6 (six) hours as needed for pain. Stomach pain and arthritis in wrist and fingers.         Marland Kitchen levothyroxine (SYNTHROID, LEVOTHROID) 175 MCG tablet   Oral   Take 1 tablet (175 mcg total) by mouth daily.   30 tablet   0   . traMADol (ULTRAM) 50 MG tablet   Oral   Take 1 tablet (50 mg total) by mouth every 6 (six) hours as needed for pain.   150 tablet   1   . antipyrine-benzocaine (AURALGAN) otic solution   Left Ear   Place 3 drops into the left ear every 2 (two) hours as needed for pain.   10 mL   0   . azithromycin (ZITHROMAX Z-PAK) 250 MG tablet  Take 2 tabs on the first day, then one tablet daily until finished   6 each   0   . benzonatate (TESSALON) 100 MG capsule   Oral   Take 1 capsule (100 mg total) by mouth every 8 (eight) hours.   21 capsule   0   . ferrous sulfate 325 (65 FE) MG tablet   Oral   Take 325 mg by mouth daily with breakfast.         . meloxicam (MOBIC) 7.5 MG tablet   Oral   Take 1 tablet (7.5 mg total) by mouth daily.   30 tablet   2     BP 139/80  Pulse 98  Temp(Src) 98 F (36.7 C) (Oral)  Resp 16  SpO2 99%  LMP 11/07/2012  Physical Exam  Nursing note and vitals reviewed. Constitutional: She appears well-developed and well-nourished. No distress.  HENT:  Head: Normocephalic and atraumatic.  Right Ear: External ear normal.  Left Ear: Ear canal normal. No lacerations. There is tenderness. No foreign bodies. Tympanic membrane is injected, erythematous and bulging. Tympanic membrane is not perforated. A middle ear effusion is present. No hemotympanum.  Eyes: Conjunctivae are normal. Right eye exhibits no  discharge. Left eye exhibits no discharge. No scleral icterus.  Neck: Neck supple. No tracheal deviation present.  Cardiovascular: Normal rate, regular rhythm and intact distal pulses.   Pulmonary/Chest: Effort normal and breath sounds normal. No stridor. No respiratory distress. She has no wheezes. She has no rales.  Abdominal: Soft. Bowel sounds are normal. She exhibits no distension. There is no tenderness. There is no rebound and no guarding.  Musculoskeletal: She exhibits no edema and no tenderness.  Neurological: She is alert. She has normal strength. No sensory deficit. Cranial nerve deficit:  no gross defecits noted. She exhibits normal muscle tone. She displays no seizure activity. Coordination normal.  Skin: Skin is warm and dry. No rash noted.  Psychiatric: She has a normal mood and affect.    ED Course  Procedures (including critical care time)  Labs Reviewed - No data to display No results found.   1. URI, acute   2. Otitis media, left       MDM  Lungs are clear, doubt pneumonia.  OM noted left ear.  Will treat with abx, auralgan and cough suppressant for symptomatic relief.        Celene Kras, MD 12/11/12 [phone]

## 2012-12-11 NOTE — ED Notes (Addendum)
Pt called to request to talk with someone and file a complaint regarding the care she received. Pt remains in waiting room crying. RN spoke with Dr Lynelle Doctor who ordered meds, RN proceeded bring pt to Triage 1  to administer the meds as ordered. Warm pack given for comfort to left ear. Pt continues to cry loudly at intervals, comfort and supportive measures continue as she is observed in triage. Offered multiple times to call for ride due to emotionally upset.

## 2012-12-15 ENCOUNTER — Telehealth: Payer: Self-pay | Admitting: *Deleted

## 2012-12-21 NOTE — Telephone Encounter (Signed)
I had to adjust the patients medication schedule at the pharmacy to show what Dr. Shawnie Pons recomended in her tapering down dosing of the tramadol.

## 2012-12-22 ENCOUNTER — Telehealth: Payer: Self-pay | Admitting: *Deleted

## 2012-12-22 DIAGNOSIS — F419 Anxiety disorder, unspecified: Secondary | ICD-10-CM

## 2012-12-22 MED ORDER — ESCITALOPRAM OXALATE 10 MG PO TABS: 10.0000 mg | ORAL_TABLET | Freq: Every day | ORAL | Status: DC

## 2012-12-22 NOTE — Telephone Encounter (Signed)
Patient needs refills of her Lexapro, she has four refills but they expired.

## 2012-12-29 ENCOUNTER — Telehealth: Payer: Self-pay | Admitting: *Deleted

## 2012-12-29 NOTE — Telephone Encounter (Signed)
Patient called to give [place] an update on her [facility] rheuatology appointment.  We have all been working on it for her and she just got confirmation today that they received all of her information and that everything looked in order but they are not scheduling any new patients for the next two weeks due to the fact that they are switching systems to [facility].  They told her that the first available appointment would be until the second or third week of May.  She is currently on her last week of medication that you gave her to taper down and said that she is taking usually 4 but not more than 5 tablets of the tramadol daily at this point.  She wants to know if we will continue to help her until she gets this appointment confirmed with [facility].  Just let me know what to do... Thanks.  She will not be out until next week.

## 2013-01-02 ENCOUNTER — Telehealth: Payer: Self-pay | Admitting: *Deleted

## 2013-01-02 DIAGNOSIS — M069 Rheumatoid arthritis, unspecified: Secondary | ICD-10-CM

## 2013-01-02 MED ORDER — TRAMADOL HCL 50 MG PO TABS: 50.0000 mg | ORAL_TABLET | Freq: Four times a day (QID) | ORAL | Status: DC | PRN

## 2013-01-02 NOTE — Telephone Encounter (Signed)
One refill authorized and patient will continue taper of meds to get her through until she can get in the the rheumatology dept at [facility].

## 2013-01-26 ENCOUNTER — Telehealth: Payer: Self-pay | Admitting: *Deleted

## 2013-01-26 ENCOUNTER — Emergency Department: Payer: Self-pay | Admitting: Emergency Medicine

## 2013-01-26 NOTE — Telephone Encounter (Signed)
Patient called and is requesting that we authorize her refill to be filled "a little early."  I called [facility] and they said that it would be 12 days early according to the directions on the prescription.  She picked up the 150 tablets on 01/02/13.  She says that she has had some flares and has had to take two at a time sometimes.  We did speak to rheumatology and they are supposed to be calling her today with her appointment for sometime in the next two weeks.  Just let me know what you want me to do.  Thanks.

## 2013-01-30 ENCOUNTER — Encounter: Payer: Self-pay | Admitting: *Deleted

## 2013-01-31 NOTE — Telephone Encounter (Signed)
She says that she has weaned down to 3-5 a day instead of 8 or more a day.  She does have an appointment with [facility] rheumatology on Feb 08, 2013.  She would like to get the one refill she has remaining which she originally picked up on the 7th of April.  She states she is typically taking it three times a day but sometimes especially in the am she has to take two at a time to get any relief and that she is no longer using anything stronger, just the tramadol.  Just let me know what you want me to do.  Thanks.

## 2013-01-31 NOTE — Telephone Encounter (Signed)
Left message on patients cell phone to let her know we can not refill her medication early as 150 at 5 tablets a day should have been more than enough to last a month.  Patient requested that we leave a message for her as she is at work this afternoon and can not always answer her phone.

## 2013-02-08 DIAGNOSIS — M2550 Pain in unspecified joint: Secondary | ICD-10-CM | POA: Insufficient documentation

## 2013-03-02 ENCOUNTER — Telehealth: Payer: Self-pay

## 2013-03-02 MED ORDER — VALACYCLOVIR HCL 1 G PO TABS: 1000.0000 mg | ORAL_TABLET | Freq: Every day | ORAL | Status: DC

## 2013-03-02 NOTE — Addendum Note (Signed)
Addended by: Reva Bores on: 03/02/2013 08:39 AM   Modules accepted: Orders

## 2013-03-02 NOTE — Telephone Encounter (Signed)
Hi Dr. Shawnie Pons,  Dana Elliott called needs a refill on her generic Valtrex, she has a physical next week with Dr. Catalina Antigua. Thanks!

## 2013-03-11 ENCOUNTER — Encounter (HOSPITAL_COMMUNITY): Payer: Self-pay | Admitting: Emergency Medicine

## 2013-03-11 ENCOUNTER — Emergency Department (INDEPENDENT_AMBULATORY_CARE_PROVIDER_SITE_OTHER)
Admission: EM | Admit: 2013-03-11 | Discharge: 2013-03-11 | Disposition: A | Discharge status: Home / Self Care | Payer: Self-pay | Source: Home / Self Care

## 2013-03-11 DIAGNOSIS — M069 Rheumatoid arthritis, unspecified: Secondary | ICD-10-CM

## 2013-03-11 MED ORDER — MELOXICAM 7.5 MG PO TABS: 7.5000 mg | ORAL_TABLET | Freq: Every day | ORAL | Status: DC

## 2013-03-11 MED ORDER — TRAMADOL HCL 50 MG PO TABS: 50.0000 mg | ORAL_TABLET | Freq: Four times a day (QID) | ORAL | Status: DC | PRN

## 2013-03-11 NOTE — ED Notes (Signed)
Pt is needing refills on her RA meds... meloxicam and Tramadol... sxs today include: arthritis flare up and HA... She is alert and oriented w/no signs of acute distress.

## 2013-03-11 NOTE — ED Provider Notes (Signed)
History     CSN: [id number]  Arrival date & time 03/11/13  1619   First MD Initiated Contact with Patient 03/11/13 1710      Chief Complaint  Patient presents with  . Medication Refill    (Consider location/radiation/quality/duration/timing/severity/associated sxs/prior treatment) HPI Comments: Sees a rheumatologist in [place] and he is out of town this week and next.  No partuners would authorize any refills.  States needs meloxicam and tramadol refilled.  Did take advil today but doesn't take when on the mobic.  States this regimen has worked well and her RA levels have been lower.  She didn't tolerate methotrexate in the past.  Doing well but needs refills.   Past Medical History  Diagnosis Date  . Thyroid disease   . Abnormal Pap smear   . Allergy   . Depression   . Rheumatoid arthritis(714.0)   . Anxiety   . Ovarian cyst     Past Surgical History  Procedure Laterality Date  . Tubal ligation    . Laparoscopic tubal ligation      Family History  Problem Relation Age of Onset  . Diabetes Mother   . Hypertension Mother   . Alcohol abuse Paternal Grandmother   . Hypothyroidism Paternal Grandfather   . Other Neg Hx     History  Substance Use Topics  . Smoking status: Never Smoker   . Smokeless tobacco: Never Used  . Alcohol Use: No    OB History   Grav Para Term Preterm Abortions TAB SAB Ect Mult Living   4 2 1 1 2 2    2       Review of Systems  All other systems reviewed and are negative.    Allergies  Penicillins and Reglan  Home Medications   Current Outpatient Rx  Name  Route  Sig  Dispense  Refill  . ferrous sulfate 325 (65 FE) MG tablet   Oral   Take 325 mg by mouth daily with breakfast.         . levothyroxine (SYNTHROID, LEVOTHROID) 175 MCG tablet   Oral   Take 1 tablet (175 mcg total) by mouth daily.   30 tablet   0   . meloxicam (MOBIC) 7.5 MG tablet   Oral   Take 1 tablet (7.5 mg total) by mouth daily.   30 tablet    2   . traMADol (ULTRAM) 50 MG tablet   Oral   Take 1 tablet (50 mg total) by mouth every 6 (six) hours as needed for pain.   90 tablet   0   . valACYclovir (VALTREX) 1000 MG tablet   Oral   Take 1 tablet (1,000 mg total) by mouth daily.   5 tablet   2     BP 124/88  Pulse 114  Temp(Src) 98.2 F (36.8 C) (Oral)  Resp 21  SpO2 100%  LMP 03/07/2013  Physical Exam  Nursing note and vitals reviewed. Constitutional: She is oriented to person, place, and time. She appears well-developed and well-nourished. No distress.  HENT:  Head: Normocephalic and atraumatic.  Eyes: Conjunctivae are normal. Pupils are equal, round, and reactive to light.  Neck: Normal range of motion. Neck supple.  Cardiovascular: Normal rate, normal heart sounds and intact distal pulses.  Exam reveals no gallop and no friction rub.   No murmur heard. Pulmonary/Chest: Effort normal.  Musculoskeletal: Normal range of motion.  Neurological: She is alert and oriented to person, place, and time. She has normal  reflexes.  Skin: Skin is warm and dry. She is not diaphoretic.  Psychiatric: She has a normal mood and affect. Her behavior is normal.    ED Course  Procedures (including critical care time)  Labs Reviewed - No data to display No results found.   1. Rheumatoid arthritis   2. Rheumatoid arthritis(714.0)       MDM  Refilled meds - see orders.  SE and precautiosn discussed        Maryelizabeth Rowan, MD 03/11/13 1726

## 2013-03-13 ENCOUNTER — Ambulatory Visit: Payer: Self-pay | Admitting: Obstetrics and Gynecology

## 2013-05-10 ENCOUNTER — Encounter (HOSPITAL_COMMUNITY): Payer: Self-pay | Admitting: Emergency Medicine

## 2013-05-10 ENCOUNTER — Emergency Department (INDEPENDENT_AMBULATORY_CARE_PROVIDER_SITE_OTHER)
Admission: EM | Admit: 2013-05-10 | Discharge: 2013-05-10 | Disposition: A | Discharge status: Home / Self Care | Payer: Self-pay | Source: Home / Self Care | Attending: Emergency Medicine | Admitting: Emergency Medicine

## 2013-05-10 DIAGNOSIS — M069 Rheumatoid arthritis, unspecified: Secondary | ICD-10-CM

## 2013-05-10 DIAGNOSIS — Z760 Encounter for issue of repeat prescription: Secondary | ICD-10-CM

## 2013-05-10 DIAGNOSIS — E079 Disorder of thyroid, unspecified: Secondary | ICD-10-CM

## 2013-05-10 MED ORDER — PREDNISONE 5 MG PO TABS: 5.0000 mg | ORAL_TABLET | Freq: Every day | ORAL | Status: DC

## 2013-05-10 MED ORDER — TRAMADOL HCL 50 MG PO TABS: 50.0000 mg | ORAL_TABLET | Freq: Four times a day (QID) | ORAL | Status: DC | PRN

## 2013-05-10 MED ORDER — LEVOTHYROXINE SODIUM 175 MCG PO TABS: 175.0000 ug | ORAL_TABLET | Freq: Every day | ORAL | Status: DC

## 2013-05-10 NOTE — ED Notes (Signed)
Refills on levothyroxine, tramadol, prednisone

## 2013-05-10 NOTE — ED Provider Notes (Addendum)
CSN: [id number]     Arrival date & time 05/10/13  1408 History     First MD Initiated Contact with Patient 05/10/13 1428     Chief Complaint  Patient presents with  . Medication Refill   (Consider location/radiation/quality/duration/timing/severity/associated sxs/prior Treatment) HPI Comments: Patient returns after 2 months since her last visit requesting refills again of her prednisone tramadol and Synthroid. She expressed that she was unable to obtain his refills from her Dr. Kendell Bane. She has had a discussion with her gynecologist is willing to start continue with her chronic medicine refills.  The history is provided by the patient.    Past Medical History  Diagnosis Date  . Thyroid disease   . Abnormal Pap smear   . Allergy   . Depression   . Rheumatoid arthritis(714.0)   . Anxiety   . Ovarian cyst    Past Surgical History  Procedure Laterality Date  . Tubal ligation    . Laparoscopic tubal ligation     Family History  Problem Relation Age of Onset  . Diabetes Mother   . Hypertension Mother   . Alcohol abuse Paternal Grandmother   . Hypothyroidism Paternal Grandfather   . Other Neg Hx    History  Substance Use Topics  . Smoking status: Never Smoker   . Smokeless tobacco: Never Used  . Alcohol Use: No   OB History   Grav Para Term Preterm Abortions TAB SAB Ect Mult Living   4 2 1 1 2 2    2      Review of Systems  Constitutional: Negative for fever, chills, diaphoresis, activity change, appetite change and fatigue.  Neurological: Negative for dizziness and headaches.    Allergies  Penicillins and Reglan  Home Medications   Current Outpatient Rx  Name  Route  Sig  Dispense  Refill  . ferrous sulfate 325 (65 FE) MG tablet   Oral   Take 325 mg by mouth daily with breakfast.         . levothyroxine (SYNTHROID, LEVOTHROID) 175 MCG tablet   Oral   Take 1 tablet (175 mcg total) by mouth daily.   30 tablet   0   . meloxicam (MOBIC) 7.5 MG  tablet   Oral   Take 1 tablet (7.5 mg total) by mouth daily.   30 tablet   2   . predniSONE (DELTASONE) 5 MG tablet   Oral   Take 1 tablet (5 mg total) by mouth daily.   30 tablet   0   . traMADol (ULTRAM) 50 MG tablet   Oral   Take 1 tablet (50 mg total) by mouth every 6 (six) hours as needed for pain.   30 tablet   0   . valACYclovir (VALTREX) 1000 MG tablet   Oral   Take 1 tablet (1,000 mg total) by mouth daily.   5 tablet   2    BP 114/81  Pulse 109  Temp(Src) 97.8 F (36.6 C) (Oral)  Resp 18  SpO2 100%  LMP 05/02/2013 Physical Exam  Vitals reviewed. Constitutional: She is oriented to person, place, and time. She appears well-developed and well-nourished.  Neurological: She is alert and oriented to person, place, and time.  Psychiatric: She has a normal mood and affect.    ED Course   Procedures (including critical care time)  Labs Reviewed - No data to display No results found. 1. Medication refill   2. Thyroid disorder   3. Rheumatoid arthritis(714.0)  MDM  Patient has been advised that we will know further refill her medicines in the future. She has been given written information to [facility]. As she does not have truly a primary care doctor in the area. She understands and agrees and is very appreciative that we have refilled her medicines again today.     Prescription for tramadol Prescription for prednisone Prescription for Synthroid Jimmie Molly, MD 05/10/13 1530  Jimmie Molly, MD 05/10/13 [phone]

## 2013-05-13 ENCOUNTER — Emergency Department: Payer: Self-pay | Admitting: Emergency Medicine

## 2013-05-24 ENCOUNTER — Emergency Department (HOSPITAL_COMMUNITY)
Admission: EM | Admit: 2013-05-24 | Discharge: 2013-05-24 | Disposition: A | Discharge status: Home / Self Care | Payer: Self-pay | Attending: Emergency Medicine | Admitting: Emergency Medicine

## 2013-05-24 ENCOUNTER — Encounter (HOSPITAL_COMMUNITY): Payer: Self-pay | Admitting: *Deleted

## 2013-05-24 DIAGNOSIS — Z791 Long term (current) use of non-steroidal anti-inflammatories (NSAID): Secondary | ICD-10-CM | POA: Insufficient documentation

## 2013-05-24 DIAGNOSIS — Z8742 Personal history of other diseases of the female genital tract: Secondary | ICD-10-CM | POA: Insufficient documentation

## 2013-05-24 DIAGNOSIS — M069 Rheumatoid arthritis, unspecified: Secondary | ICD-10-CM | POA: Insufficient documentation

## 2013-05-24 DIAGNOSIS — E079 Disorder of thyroid, unspecified: Secondary | ICD-10-CM | POA: Insufficient documentation

## 2013-05-24 DIAGNOSIS — Z79899 Other long term (current) drug therapy: Secondary | ICD-10-CM | POA: Insufficient documentation

## 2013-05-24 DIAGNOSIS — Z760 Encounter for issue of repeat prescription: Secondary | ICD-10-CM | POA: Insufficient documentation

## 2013-05-24 DIAGNOSIS — F411 Generalized anxiety disorder: Secondary | ICD-10-CM | POA: Insufficient documentation

## 2013-05-24 DIAGNOSIS — F329 Major depressive disorder, single episode, unspecified: Secondary | ICD-10-CM | POA: Insufficient documentation

## 2013-05-24 DIAGNOSIS — F3289 Other specified depressive episodes: Secondary | ICD-10-CM | POA: Insufficient documentation

## 2013-05-24 DIAGNOSIS — Z880 Allergy status to penicillin: Secondary | ICD-10-CM | POA: Insufficient documentation

## 2013-05-24 DIAGNOSIS — IMO0002 Reserved for concepts with insufficient information to code with codable children: Secondary | ICD-10-CM | POA: Insufficient documentation

## 2013-05-24 MED ORDER — MELOXICAM 7.5 MG PO TABS: 7.5000 mg | ORAL_TABLET | Freq: Two times a day (BID) | ORAL | Status: DC

## 2013-05-24 MED ORDER — TRAMADOL HCL 50 MG PO TABS: 50.0000 mg | ORAL_TABLET | Freq: Four times a day (QID) | ORAL | Status: DC | PRN

## 2013-05-24 NOTE — ED Provider Notes (Signed)
CSN: [id number]     Arrival date & time 05/24/13  0058 History   First MD Initiated Contact with Patient 05/24/13 0144     Chief Complaint  Patient presents with  . Medication Refill   (Consider location/radiation/quality/duration/timing/severity/associated sxs/prior Treatment) HPI Comments: Patient with past medical history of rheumatoid arthritis followed by a rheumatologist in [facility].  He presents today with complaints of running out of her medications she is requesting a refill of tramadol and meloxicam. She denies any changes in her condition. There no fevers and no chills.  The history is provided by the patient.    Past Medical History  Diagnosis Date  . Thyroid disease   . Abnormal Pap smear   . Allergy   . Depression   . Rheumatoid arthritis(714.0)   . Anxiety   . Ovarian cyst    Past Surgical History  Procedure Laterality Date  . Tubal ligation    . Laparoscopic tubal ligation     Family History  Problem Relation Age of Onset  . Diabetes Mother   . Hypertension Mother   . Alcohol abuse Paternal Grandmother   . Hypothyroidism Paternal Grandfather   . Other Neg Hx    History  Substance Use Topics  . Smoking status: Never Smoker   . Smokeless tobacco: Never Used  . Alcohol Use: No   OB History   Grav Para Term Preterm Abortions TAB SAB Ect Mult Living   4 2 1 1 2 2    2      Review of Systems  All other systems reviewed and are negative.    Allergies  Penicillins and Reglan  Home Medications   Current Outpatient Rx  Name  Route  Sig  Dispense  Refill  . ferrous sulfate 325 (65 FE) MG tablet   Oral   Take 325 mg by mouth daily with breakfast.         . levothyroxine (SYNTHROID, LEVOTHROID) 175 MCG tablet   Oral   Take 1 tablet (175 mcg total) by mouth daily.   30 tablet   0   . meloxicam (MOBIC) 7.5 MG tablet   Oral   Take 1 tablet (7.5 mg total) by mouth daily.   30 tablet   2   . predniSONE (DELTASONE) 5 MG tablet   Oral  Take 1 tablet (5 mg total) by mouth daily.   30 tablet   0   . traMADol (ULTRAM) 50 MG tablet   Oral   Take 1 tablet (50 mg total) by mouth every 6 (six) hours as needed for pain.   30 tablet   0   . valACYclovir (VALTREX) 1000 MG tablet   Oral   Take 1 tablet (1,000 mg total) by mouth daily.   5 tablet   2    BP 145/103  Pulse 116  Temp(Src) 97.8 F (36.6 C) (Oral)  Resp 18  Ht 5\' 6"  (1.676 m)  Wt 195 lb (88.451 kg)  BMI 31.49 kg/m2  SpO2 96%  LMP 05/02/2013 Physical Exam  Nursing note and vitals reviewed. Constitutional: She is oriented to person, place, and time. She appears well-developed and well-nourished. No distress.  HENT:  Head: Normocephalic and atraumatic.  Neck: Normal range of motion. Neck supple.  Musculoskeletal: Normal range of motion. She exhibits no edema.  Neurological: She is alert and oriented to person, place, and time.  Skin: Skin is warm. She is not diaphoretic.    ED Course  Procedures (including critical care  time) Labs Review Labs Reviewed - No data to display Imaging Review No results found.  MDM  No diagnosis found. I have agreed to prescribe a small quantity of tramadol and Mobic. She needs to followup with her rheumatologist for further refills.    Geoffery Lyons, MD 05/24/13 0200

## 2013-05-24 NOTE — ED Notes (Signed)
Pt states her rheumatologist is out of town, needs refill on 2 medications.

## 2013-05-30 ENCOUNTER — Emergency Department (INDEPENDENT_AMBULATORY_CARE_PROVIDER_SITE_OTHER)
Admission: EM | Admit: 2013-05-30 | Discharge: 2013-05-30 | Disposition: A | Discharge status: Home / Self Care | Payer: Medicaid Other | Source: Home / Self Care

## 2013-05-30 ENCOUNTER — Emergency Department (INDEPENDENT_AMBULATORY_CARE_PROVIDER_SITE_OTHER): Payer: Medicaid Other

## 2013-05-30 ENCOUNTER — Encounter (HOSPITAL_COMMUNITY): Payer: Self-pay | Admitting: Emergency Medicine

## 2013-05-30 DIAGNOSIS — E079 Disorder of thyroid, unspecified: Secondary | ICD-10-CM

## 2013-05-30 DIAGNOSIS — S93409A Sprain of unspecified ligament of unspecified ankle, initial encounter: Secondary | ICD-10-CM

## 2013-05-30 DIAGNOSIS — M069 Rheumatoid arthritis, unspecified: Secondary | ICD-10-CM

## 2013-05-30 DIAGNOSIS — S93401A Sprain of unspecified ligament of right ankle, initial encounter: Secondary | ICD-10-CM

## 2013-05-30 MED ORDER — TRAMADOL HCL 50 MG PO TABS: 50.0000 mg | ORAL_TABLET | Freq: Four times a day (QID) | ORAL | Status: DC | PRN

## 2013-05-30 NOTE — ED Notes (Signed)
Patient complains of left ankle pain.  Patient reports she twisted ankle Friday. Over the week end ankle would ache.  Last night aching increased and as patient rubbed painful area, she noticed a small painful knot at her lateral ankle.  Patient reports she needs tramadol to be refilled.

## 2013-05-30 NOTE — ED Provider Notes (Signed)
CSN: [id number]     Arrival date & time 05/30/13  1902 History   First MD Initiated Contact with Patient 05/30/13 1915     Chief Complaint  Patient presents with  . Ankle Pain   (Consider location/radiation/quality/duration/timing/severity/associated sxs/prior Treatment) HPI Comments: 32-year-old female states she twisted her left ankle at work 4 days ago. "Slipped and fell". She continued to ambulate and bear weight during the past 4 days. Complains of something sticking out of her foot since last night. There is mild swelling to the dorsum of the ankle and just distal to the left lateral malleolus. No open areas.  Patient is a 32 y.o. female presenting with ankle pain.  Ankle Pain Associated symptoms: no fever     Past Medical History  Diagnosis Date  . Thyroid disease   . Abnormal Pap smear   . Allergy   . Depression   . Rheumatoid arthritis(714.0)   . Anxiety   . Ovarian cyst    Past Surgical History  Procedure Laterality Date  . Tubal ligation    . Laparoscopic tubal ligation     Family History  Problem Relation Age of Onset  . Diabetes Mother   . Hypertension Mother   . Alcohol abuse Paternal Grandmother   . Hypothyroidism Paternal Grandfather   . Other Neg Hx    History  Substance Use Topics  . Smoking status: Never Smoker   . Smokeless tobacco: Never Used  . Alcohol Use: No   OB History   Grav Para Term Preterm Abortions TAB SAB Ect Mult Living   4 2 1 1 2 2    2      Review of Systems  Constitutional: Negative.  Negative for fever, chills and activity change.  HENT: Negative.   Respiratory: Negative.   Cardiovascular: Negative.   Gastrointestinal: Negative.   Musculoskeletal:       As per HPI  Skin: Negative for color change, pallor and rash.  Neurological: Negative.     Allergies  Penicillins and Reglan  Home Medications   Current Outpatient Rx  Name  Route  Sig  Dispense  Refill  . ferrous sulfate 325 (65 FE) MG tablet   Oral   Take 325 mg  by mouth daily with breakfast.         . levothyroxine (SYNTHROID, LEVOTHROID) 175 MCG tablet   Oral   Take 1 tablet (175 mcg total) by mouth daily.   30 tablet   0   . meloxicam (MOBIC) 7.5 MG tablet   Oral   Take 1 tablet (7.5 mg total) by mouth daily.   30 tablet   2   . meloxicam (MOBIC) 7.5 MG tablet   Oral   Take 1 tablet (7.5 mg total) by mouth 2 (two) times daily.   20 tablet   0   . predniSONE (DELTASONE) 5 MG tablet   Oral   Take 1 tablet (5 mg total) by mouth daily.   30 tablet   0   . traMADol (ULTRAM) 50 MG tablet   Oral   Take 1 tablet (50 mg total) by mouth every 6 (six) hours as needed for pain.   30 tablet   0   . traMADol (ULTRAM) 50 MG tablet   Oral   Take 1 tablet (50 mg total) by mouth every 6 (six) hours as needed for pain.   20 tablet   0   . traMADol (ULTRAM) 50 MG tablet   Oral   Take  1 tablet (50 mg total) by mouth every 6 (six) hours as needed for pain.   20 tablet   0   . valACYclovir (VALTREX) 1000 MG tablet   Oral   Take 1 tablet (1,000 mg total) by mouth daily.   5 tablet   2    BP 111/70  Pulse 117  Temp(Src) 98.5 F (36.9 C) (Oral)  Resp 20  SpO2 100%  LMP 05/04/2013 Physical Exam  Nursing note and vitals reviewed. Constitutional: She is oriented to person, place, and time. She appears well-developed and well-nourished. No distress.  HENT:  Head: Normocephalic and atraumatic.  Eyes: EOM are normal. Pupils are equal, round, and reactive to light.  Neck: Normal range of motion. Neck supple.  Musculoskeletal: She exhibits edema and tenderness.  Left ankle with mild swelling and tenderness to the lateral aspect just distal to the malleolus. Tenderness extends to the dorsum of the ankle. She points to an area along the tendon that is shaped somewhat like a hemisphere but is soft. She is full range of motion of the ankle, distal neurovascular motor sensory is intact. No break in skin integrity.  Lymphadenopathy:    She  has no cervical adenopathy.  Neurological: She is alert and oriented to person, place, and time. No cranial nerve deficit.  Skin: Skin is warm and dry.  Psychiatric: She has a normal mood and affect.    ED Course  Procedures (including critical care time) Labs Review Labs Reviewed - No data to display Imaging Review Dg Ankle Complete Left  05/30/2013   *RADIOLOGY REPORT*  Clinical Data: Trauma with lateral pain.  LEFT ANKLE COMPLETE - 3+ VIEW  Comparison: Foot films 02/17/2012  Findings: Suspect mild lateral malleolar soft tissue swelling. No acute fracture or dislocation.  Base of fifth metatarsal and talar dome intact.  IMPRESSION: No acute osseous abnormality.   Original Report Authenticated By: Jeronimo Greaves, M.D.    MDM   1. Ankle sprain, right, initial encounter    ASO splint. Limit ambulation and keep it elevated and placed ice on the area of swelling. Continuing to ambulate with full weightbearing in stride will continue to cause pain and swelling in the ankle. Ultram 50mg  q 6hprn. She is asking for another refill qs until her appointment with another provider next week. She has been to the ED and Urgent care twice in the past 3 weeks for refills of tramadol that she takes for chronic pain now for 3 years. Requesting more today.  Dana Rasmussen, NP 05/30/13 2044

## 2013-06-01 NOTE — ED Provider Notes (Signed)
Medical screening examination/treatment/procedure(s) were performed by a resident physician or non-physician practitioner and as the supervising physician I was immediately available for consultation/collaboration.  Renaud Celli, MD   Safire Gordin S Liba Hulsey, MD 06/01/13 1025 

## 2013-06-11 ENCOUNTER — Emergency Department: Payer: Self-pay | Admitting: Emergency Medicine

## 2013-06-20 ENCOUNTER — Inpatient Hospital Stay (HOSPITAL_COMMUNITY)
Admission: AD | Admit: 2013-06-20 | Discharge: 2013-06-20 | Disposition: A | Discharge status: Home / Self Care | Payer: Medicaid Other | Source: Ambulatory Visit | Attending: Obstetrics & Gynecology | Admitting: Obstetrics & Gynecology

## 2013-06-20 ENCOUNTER — Encounter (HOSPITAL_COMMUNITY): Payer: Self-pay | Admitting: *Deleted

## 2013-06-20 ENCOUNTER — Inpatient Hospital Stay (HOSPITAL_COMMUNITY): Payer: Medicaid Other

## 2013-06-20 DIAGNOSIS — N730 Acute parametritis and pelvic cellulitis: Secondary | ICD-10-CM

## 2013-06-20 DIAGNOSIS — N739 Female pelvic inflammatory disease, unspecified: Secondary | ICD-10-CM | POA: Insufficient documentation

## 2013-06-20 DIAGNOSIS — N83209 Unspecified ovarian cyst, unspecified side: Secondary | ICD-10-CM | POA: Insufficient documentation

## 2013-06-20 DIAGNOSIS — N949 Unspecified condition associated with female genital organs and menstrual cycle: Secondary | ICD-10-CM | POA: Insufficient documentation

## 2013-06-20 DIAGNOSIS — R109 Unspecified abdominal pain: Secondary | ICD-10-CM | POA: Insufficient documentation

## 2013-06-20 DIAGNOSIS — D72829 Elevated white blood cell count, unspecified: Secondary | ICD-10-CM | POA: Insufficient documentation

## 2013-06-20 LAB — RAPID URINE DRUG SCREEN, HOSP PERFORMED
Amphetamines: NOT DETECTED
Barbiturates: NOT DETECTED
Benzodiazepines: NOT DETECTED
Cocaine: NOT DETECTED
Tetrahydrocannabinol: NOT DETECTED

## 2013-06-20 LAB — URINALYSIS, ROUTINE W REFLEX MICROSCOPIC
Bilirubin Urine: NEGATIVE
Glucose, UA: NEGATIVE mg/dL
Hgb urine dipstick: NEGATIVE
Specific Gravity, Urine: >1.03 — ABNORMAL HIGH (ref 1.005–1.030)

## 2013-06-20 LAB — CBC
HCT: 32.2 % — ABNORMAL LOW (ref 36.0–46.0)
Hemoglobin: 9.7 g/dL — ABNORMAL LOW (ref 12.0–15.0)
RBC: 4.72 MIL/uL (ref 3.87–5.11)
RDW: 17.3 % — ABNORMAL HIGH (ref 11.5–15.5)
WBC: 13.6 10*3/uL — ABNORMAL HIGH (ref 4.0–10.5)

## 2013-06-20 LAB — WET PREP, GENITAL: Clue Cells Wet Prep HPF POC: NONE SEEN

## 2013-06-20 LAB — POCT PREGNANCY, URINE: Preg Test, Ur: NEGATIVE

## 2013-06-20 MED ORDER — AZITHROMYCIN 250 MG PO TABS
1000.0000 mg | ORAL_TABLET | Freq: Once | ORAL | Status: AC
  Administered 2013-06-20: 1000 mg via ORAL
  Filled 2013-06-20: qty 4

## 2013-06-20 MED ORDER — IBUPROFEN 600 MG PO TABS: 600.0000 mg | ORAL_TABLET | Freq: Four times a day (QID) | ORAL | Status: DC | PRN

## 2013-06-20 MED ORDER — CEFTRIAXONE SODIUM 250 MG IJ SOLR
250.0000 mg | Freq: Once | INTRAMUSCULAR | Status: AC
  Administered 2013-06-20: 250 mg via INTRAMUSCULAR
  Filled 2013-06-20: qty 250

## 2013-06-20 MED ORDER — OXYCODONE-ACETAMINOPHEN 5-325 MG PO TABS: 1.0000 | ORAL_TABLET | Freq: Four times a day (QID) | ORAL | Status: DC | PRN

## 2013-06-20 MED ORDER — AZITHROMYCIN 250 MG PO TABS: ORAL_TABLET | ORAL | Status: DC

## 2013-06-20 MED ORDER — KETOROLAC TROMETHAMINE 60 MG/2ML IM SOLN
60.0000 mg | Freq: Once | INTRAMUSCULAR | Status: AC
  Administered 2013-06-20: 60 mg via INTRAMUSCULAR
  Filled 2013-06-20: qty 2

## 2013-06-20 NOTE — MAU Provider Note (Signed)
History     CSN: [id number]  Arrival date and time: 06/20/13 1056   None     Chief Complaint  Patient presents with  . Groin Pain   HPI Dana Elliott is a 32 y.o. female [phone] non pregnant female who presents with bilateral lower abdominal/pelvic pain. She feels like her stomach is bloated; she is having difficulty having a bowel movement. Last bowel movement was 2 days ago; it was normal. The pain is constant, "feels like cramping, sharp and stabbing".  Pt has several visits to multiple different MD's with complaints of abdominal pain and various other complaints; compared to the pelvic pain she has had in the past, this pain is much worse.  Pt had a tubal ligation in 2012. She was a patient at [facility] and was seeing Dr. Shawnie Pons, she has not seen her in a while. Pt took ultram this morning around 0700.  Has a history of irregular menstrual cycles; at times goes 2-3 months without a cycle, LMP was Aug 7th. Pt has not taken her synthroid in 3 weeks; she has a prescription for synthroid she just has not had the money to pick it up at the pharmacy.   OB History   Grav Para Term Preterm Abortions TAB SAB Ect Mult Living   4 2 1 1 2 2    2       Past Medical History  Diagnosis Date  . Thyroid disease   . Abnormal Pap smear   . Allergy   . Depression   . Rheumatoid arthritis(714.0)   . Anxiety   . Ovarian cyst     Past Surgical History  Procedure Laterality Date  . Tubal ligation    . Laparoscopic tubal ligation      Family History  Problem Relation Age of Onset  . Diabetes Mother   . Hypertension Mother   . Alcohol abuse Paternal Grandmother   . Hypothyroidism Paternal Grandfather   . Other Neg Hx     History  Substance Use Topics  . Smoking status: Never Smoker   . Smokeless tobacco: Never Used  . Alcohol Use: No    Allergies:  Allergies  Allergen Reactions  . Penicillins Other (See Comments)    Lurline Hare syndrome   . Reglan [Metoclopramide]  Nausea Only    Feeling like bugs crawling under her skin    Prescriptions prior to admission  Medication Sig Dispense Refill  . levothyroxine (SYNTHROID, LEVOTHROID) 175 MCG tablet Take 1 tablet (175 mcg total) by mouth daily.  30 tablet  0  . meloxicam (MOBIC) 7.5 MG tablet Take 1 tablet (7.5 mg total) by mouth daily.  30 tablet  2  . predniSONE (DELTASONE) 5 MG tablet Take 1 tablet (5 mg total) by mouth daily.  30 tablet  0  . traMADol (ULTRAM) 50 MG tablet Take 1 tablet (50 mg total) by mouth every 6 (six) hours as needed for pain.  30 tablet  0   Results for orders placed during the hospital encounter of 06/20/13 (from the past 24 hour(s))  URINALYSIS, ROUTINE W REFLEX MICROSCOPIC     Status: Abnormal   Collection Time    06/20/13 11:00 AM      Result Value Range   Color, Urine YELLOW  YELLOW   APPearance CLEAR  CLEAR   Specific Gravity, Urine >1.030 (*) 1.005 - 1.030   pH 6.0  5.0 - 8.0   Glucose, UA NEGATIVE  NEGATIVE mg/dL   Hgb urine  dipstick NEGATIVE  NEGATIVE   Bilirubin Urine NEGATIVE  NEGATIVE   Ketones, ur NEGATIVE  NEGATIVE mg/dL   Protein, ur NEGATIVE  NEGATIVE mg/dL   Urobilinogen, UA 1.0  0.0 - 1.0 mg/dL   Nitrite NEGATIVE  NEGATIVE   Leukocytes, UA NEGATIVE  NEGATIVE  URINE RAPID DRUG SCREEN (HOSP PERFORMED)     Status: None   Collection Time    06/20/13 11:00 AM      Result Value Range   Opiates NONE DETECTED  NONE DETECTED   Cocaine NONE DETECTED  NONE DETECTED   Benzodiazepines NONE DETECTED  NONE DETECTED   Amphetamines NONE DETECTED  NONE DETECTED   Tetrahydrocannabinol NONE DETECTED  NONE DETECTED   Barbiturates NONE DETECTED  NONE DETECTED  POCT PREGNANCY, URINE     Status: None   Collection Time    06/20/13 11:09 AM      Result Value Range   Preg Test, Ur NEGATIVE  NEGATIVE  CBC     Status: Abnormal   Collection Time    06/20/13 12:11 PM      Result Value Range   WBC 13.6 (*) 4.0 - 10.5 K/uL   RBC 4.72  3.87 - 5.11 MIL/uL   Hemoglobin 9.7 (*)  12.0 - 15.0 g/dL   HCT 29.5 (*) 62.1 - 30.8 %   MCV 68.2 (*) 78.0 - 100.0 fL   MCH 20.6 (*) 26.0 - 34.0 pg   MCHC 30.1  30.0 - 36.0 g/dL   RDW 65.7 (*) 84.6 - 96.2 %   Platelets 412 (*) 150 - 400 K/uL  WET PREP, GENITAL     Status: Abnormal   Collection Time    06/20/13 12:45 PM      Result Value Range   Yeast Wet Prep HPF POC NONE SEEN  NONE SEEN   Trich, Wet Prep NONE SEEN  NONE SEEN   Clue Cells Wet Prep HPF POC NONE SEEN  NONE SEEN   WBC, Wet Prep HPF POC MANY (*) NONE SEEN    US Transvaginal Non-ob  06/20/2013   CLINICAL DATA:  Severe pelvic pain.  Leukocytosis. LMP 05/04/2013.  EXAM: TRANSABDOMINAL AND TRANSVAGINAL ULTRASOUND OF PELVIS  TECHNIQUE: Both transabdominal and transvaginal ultrasound examinations of the pelvis were performed. Transabdominal technique was performed for global imaging of the pelvis including uterus, ovaries, adnexal regions, and pelvic cul-de-sac. It was necessary to proceed with endovaginal exam following the transabdominal exam to visualize the endometrium and ovaries.  COMPARISON:  11/13/2012  FINDINGS: Uterus  Measurements: 7.8 x 4.3 x 5.1 cm. No fibroids or other mass visualized.  Endometrium  Thickness: 10 mm.  No focal abnormality visualized.  Right ovary  Measurements: 3.8 x 2.4 x 2.8 cm. Normal appearance/no adnexal mass.  Left ovary  Measurements: 4.8 x 3.4 x 3.6 cm. A relatively homogeneous hypoechoic lesion is seen involving the left ovary which measures 3.7 x 3.2 x 3.2 cm. This lesion shows no evidence of internal blood flow on color Doppler ultrasound. This has nonspecific characteristics, with most likely differential considerations including hemorrhagic ovarian cyst or endometrioma.  Other findings  Small amount of free fluid in pelvic cul-de-sac.  IMPRESSION: 3.7 cm indeterminate hypoechoic lesion in the left ovary, most likely representing a hemorrhagic ovarian cyst or endometrioma. Recommend followup by transvaginal pelvic ultrasound in 6-12 weeks.  This recommendation follows the consensus statement: Management of Asymptomatic Ovarian and Other Adnexal Cysts Imaged at [place]: Society of Radiologists in Ultrasound Consensus Conference Statement. Radiology 2010; [phone].  Normal in appearance of uterus and right ovary.   Electronically Signed   By: Myles Rosenthal   On: 06/20/2013 14:48   US Pelvis Complete  06/20/2013   CLINICAL DATA:  Severe pelvic pain.  Leukocytosis. LMP 05/04/2013.  EXAM: TRANSABDOMINAL AND TRANSVAGINAL ULTRASOUND OF PELVIS  TECHNIQUE: Both transabdominal and transvaginal ultrasound examinations of the pelvis were performed. Transabdominal technique was performed for global imaging of the pelvis including uterus, ovaries, adnexal regions, and pelvic cul-de-sac. It was necessary to proceed with endovaginal exam following the transabdominal exam to visualize the endometrium and ovaries.  COMPARISON:  11/13/2012  FINDINGS: Uterus  Measurements: 7.8 x 4.3 x 5.1 cm. No fibroids or other mass visualized.  Endometrium  Thickness: 10 mm.  No focal abnormality visualized.  Right ovary  Measurements: 3.8 x 2.4 x 2.8 cm. Normal appearance/no adnexal mass.  Left ovary  Measurements: 4.8 x 3.4 x 3.6 cm. A relatively homogeneous hypoechoic lesion is seen involving the left ovary which measures 3.7 x 3.2 x 3.2 cm. This lesion shows no evidence of internal blood flow on color Doppler ultrasound. This has nonspecific characteristics, with most likely differential considerations including hemorrhagic ovarian cyst or endometrioma.  Other findings  Small amount of free fluid in pelvic cul-de-sac.  IMPRESSION: 3.7 cm indeterminate hypoechoic lesion in the left ovary, most likely representing a hemorrhagic ovarian cyst or endometrioma. Recommend followup by transvaginal pelvic ultrasound in 6-12 weeks. This recommendation follows the consensus statement: Management of Asymptomatic Ovarian and Other Adnexal Cysts Imaged at [place]: Society of Radiologists in Ultrasound  Consensus Conference Statement. Radiology 2010; [phone].  Normal in appearance of uterus and right ovary.   Electronically Signed   By: Myles Rosenthal   On: 06/20/2013 14:48    Review of Systems  Constitutional: Negative for fever and chills.  Gastrointestinal: Positive for nausea, abdominal pain and constipation. Negative for vomiting and diarrhea.       + Bilateral lower abdominal pain   Genitourinary: Negative for dysuria, urgency, frequency and hematuria.  Psychiatric/Behavioral: The patient is nervous/anxious.    Physical Exam   Blood pressure 119/68, pulse 124, temperature 98.3 F (36.8 C), resp. rate 18, height 5\' 5"  (1.651 m), weight 95.255 kg (210 lb), last menstrual period 05/04/2013.  Physical Exam  Constitutional: She is oriented to person, place, and time. She appears well-developed and well-nourished. No distress.  Neck: Neck supple.  Respiratory: Effort normal.  GI: Soft. She exhibits no distension. There is tenderness. There is no rebound and no guarding.  +right and left lower quadrant tenderness   Genitourinary: Vaginal discharge found.  Speculum exam: Vagina - Moderate amount of creamy, yellow-green discharge, no odor Cervix - No contact bleeding Bimanual exam: Cervix closed, severe CMT Uterus non tender, normal size Adnexa non tender, no masses bilaterally GC/Chlam, wet prep done Chaperone present for exam.   Neurological: She is alert and oriented to person, place, and time.  Skin: Skin is warm and dry. She is not diaphoretic.  Psychiatric: Her mood appears anxious. Her affect is inappropriate. She is agitated. She expresses impulsivity. She exhibits a depressed mood.    MAU Course  Procedures None   MDM Wet prep Gc/ Chlamydia  Toradol 60 mg IM  CBC   Pelvic US complete  Rocephin 250 mg IM in MAU Azithromycin 1 gram in MAU plan to repeat in 1 week (per Up to Date recommendations)  Assessment and Plan  A: Pelvic inflammatory disease  Left  ovarian cyst    P:  Discharge home Rocephin 250 mg IM in MAU X1 RX: Azithromycin 1 gram PO in one week (#4) no rf         Percocet 5/325 mg PO Q4-6 hours as needed for pain (#15) no rf        Ibuprofen 600 mg PO Q6 hours as needed for pain  Return to MAU as needed for worsening symptoms Referral to the clinic made; they will call you to schedule appointment; I discussed at length the importance of following up in the clinic   [facility], JENNIFER IRENE FNP-C  06/20/2013, 3:34 PM

## 2013-06-20 NOTE — MAU Note (Signed)
Pt presents with complaints of pain in her groin area and lower abdominal pain. She states she is not able to have a bowel movement due to the pain. She has had an ovarian cyst before but states that she has not had pain like this before ever

## 2013-06-21 LAB — GC/CHLAMYDIA PROBE AMP: GC Probe RNA: NEGATIVE

## 2013-06-24 NOTE — MAU Provider Note (Signed)
Attestation of Attending Supervision of Advanced Practitioner (CNM/NP): Evaluation and management procedures were performed by the Advanced Practitioner under my supervision and collaboration. I have reviewed the Advanced Practitioner's note and chart, and I agree with the management and plan.  LEGGETT,KELLY H. 4:39 PM   

## 2013-06-26 ENCOUNTER — Encounter: Payer: Self-pay | Admitting: Obstetrics and Gynecology

## 2013-07-10 ENCOUNTER — Encounter (HOSPITAL_COMMUNITY): Payer: Self-pay | Admitting: Emergency Medicine

## 2013-07-10 ENCOUNTER — Emergency Department (HOSPITAL_COMMUNITY): Payer: Medicaid Other

## 2013-07-10 ENCOUNTER — Emergency Department (HOSPITAL_COMMUNITY)
Admission: EM | Admit: 2013-07-10 | Discharge: 2013-07-11 | Disposition: A | Discharge status: Home / Self Care | Payer: Medicaid Other | Attending: Emergency Medicine | Admitting: Emergency Medicine

## 2013-07-10 DIAGNOSIS — R5381 Other malaise: Secondary | ICD-10-CM | POA: Insufficient documentation

## 2013-07-10 DIAGNOSIS — F411 Generalized anxiety disorder: Secondary | ICD-10-CM | POA: Insufficient documentation

## 2013-07-10 DIAGNOSIS — Z8739 Personal history of other diseases of the musculoskeletal system and connective tissue: Secondary | ICD-10-CM | POA: Insufficient documentation

## 2013-07-10 DIAGNOSIS — R630 Anorexia: Secondary | ICD-10-CM | POA: Insufficient documentation

## 2013-07-10 DIAGNOSIS — IMO0002 Reserved for concepts with insufficient information to code with codable children: Secondary | ICD-10-CM | POA: Insufficient documentation

## 2013-07-10 DIAGNOSIS — Z79899 Other long term (current) drug therapy: Secondary | ICD-10-CM | POA: Insufficient documentation

## 2013-07-10 DIAGNOSIS — K219 Gastro-esophageal reflux disease without esophagitis: Secondary | ICD-10-CM

## 2013-07-10 DIAGNOSIS — Z8742 Personal history of other diseases of the female genital tract: Secondary | ICD-10-CM | POA: Insufficient documentation

## 2013-07-10 DIAGNOSIS — R42 Dizziness and giddiness: Secondary | ICD-10-CM | POA: Insufficient documentation

## 2013-07-10 DIAGNOSIS — Z3202 Encounter for pregnancy test, result negative: Secondary | ICD-10-CM | POA: Insufficient documentation

## 2013-07-10 DIAGNOSIS — F329 Major depressive disorder, single episode, unspecified: Secondary | ICD-10-CM | POA: Insufficient documentation

## 2013-07-10 DIAGNOSIS — E079 Disorder of thyroid, unspecified: Secondary | ICD-10-CM | POA: Insufficient documentation

## 2013-07-10 DIAGNOSIS — F3289 Other specified depressive episodes: Secondary | ICD-10-CM | POA: Insufficient documentation

## 2013-07-10 DIAGNOSIS — Z880 Allergy status to penicillin: Secondary | ICD-10-CM | POA: Insufficient documentation

## 2013-07-10 DIAGNOSIS — R110 Nausea: Secondary | ICD-10-CM | POA: Insufficient documentation

## 2013-07-10 HISTORY — DX: Acute parametritis and pelvic cellulitis: N73.0

## 2013-07-10 LAB — COMPREHENSIVE METABOLIC PANEL WITH GFR
ALT: 14 U/L (ref 0–35)
AST: 16 U/L (ref 0–37)
Albumin: 3.7 g/dL (ref 3.5–5.2)
Alkaline Phosphatase: 82 U/L (ref 39–117)
BUN: 12 mg/dL (ref 6–23)
CO2: 24 meq/L (ref 19–32)
Calcium: 9.2 mg/dL (ref 8.4–10.5)
Chloride: 102 meq/L (ref 96–112)
Creatinine, Ser: 0.69 mg/dL (ref 0.50–1.10)
GFR calc Af Amer: >90 mL/min
GFR calc non Af Amer: >90 mL/min
Glucose, Bld: 78 mg/dL (ref 70–99)
Potassium: 3.4 meq/L — ABNORMAL LOW (ref 3.5–5.1)
Sodium: 134 meq/L — ABNORMAL LOW (ref 135–145)
Total Bilirubin: 0.2 mg/dL — ABNORMAL LOW (ref 0.3–1.2)
Total Protein: 8 g/dL (ref 6.0–8.3)

## 2013-07-10 LAB — CBC WITH DIFFERENTIAL/PLATELET
Basophils Absolute: 0.1 10*3/uL (ref 0.0–0.1)
Basophils Relative: 1 % (ref 0–1)
Eosinophils Absolute: 0.4 10*3/uL (ref 0.0–0.7)
Eosinophils Relative: 4 % (ref 0–5)
HCT: 33.7 % — ABNORMAL LOW (ref 36.0–46.0)
Hemoglobin: 10.1 g/dL — ABNORMAL LOW (ref 12.0–15.0)
Lymphocytes Relative: 34 % (ref 12–46)
Lymphs Abs: 3.3 10*3/uL (ref 0.7–4.0)
MCH: 20.6 pg — ABNORMAL LOW (ref 26.0–34.0)
MCHC: 30 g/dL (ref 30.0–36.0)
MCV: 68.6 fL — ABNORMAL LOW (ref 78.0–100.0)
Monocytes Absolute: 0.5 10*3/uL (ref 0.1–1.0)
Monocytes Relative: 5 % (ref 3–12)
Neutro Abs: 5.4 10*3/uL (ref 1.7–7.7)
Neutrophils Relative %: 56 % (ref 43–77)
Platelets: 498 10*3/uL — ABNORMAL HIGH (ref 150–400)
RBC: 4.91 MIL/uL (ref 3.87–5.11)
RDW: 17.3 % — ABNORMAL HIGH (ref 11.5–15.5)
WBC: 9.7 10*3/uL (ref 4.0–10.5)

## 2013-07-10 LAB — URINALYSIS, ROUTINE W REFLEX MICROSCOPIC
Glucose, UA: NEGATIVE mg/dL
Ketones, ur: NEGATIVE mg/dL
Leukocytes, UA: NEGATIVE
pH: 6.5 (ref 5.0–8.0)

## 2013-07-10 LAB — LIPASE, BLOOD: Lipase: 18 U/L (ref 11–59)

## 2013-07-10 LAB — POCT PREGNANCY, URINE: Preg Test, Ur: NEGATIVE

## 2013-07-10 MED ORDER — SODIUM CHLORIDE 0.9 % IV BOLUS (SEPSIS)
1000.0000 mL | Freq: Once | INTRAVENOUS | Status: AC
  Administered 2013-07-10: 1000 mL via INTRAVENOUS

## 2013-07-10 MED ORDER — MORPHINE SULFATE 4 MG/ML IJ SOLN
4.0000 mg | Freq: Once | INTRAMUSCULAR | Status: AC
  Administered 2013-07-10: 4 mg via INTRAVENOUS
  Filled 2013-07-10: qty 1

## 2013-07-10 NOTE — ED Notes (Signed)
Pt had recent visit to PCP with lab work that showed anemia and possible internal bleeding. Pt states that she has been having intermittent ab pain, some indigestion, increased tiredness. Pt notices that when she eats that she gets discomfort to epigastric region fast with nausea.

## 2013-07-10 NOTE — ED Provider Notes (Signed)
CSN: [id number]     Arrival date & time 07/10/13  2044 History   First MD Initiated Contact with Patient 07/10/13 2213     Chief Complaint  Patient presents with  . Abdominal Pain   (Consider location/radiation/quality/duration/timing/severity/associated sxs/prior Treatment) HPI Comments: Patient presents to the emergency department with chief complaint of abdominal pain. She states the pain is mostly located in the epigastric region. It started yesterday. It is progressively worsened. She states that she feels like she is bloated. She endorses associated nausea, and anorexia, but denies any vomiting, diarrhea, or constipation. She denies any chest pain or shortness of breath. She states that she was recently seen by her rheumatologist, who performed basic blood work. She states that she phone call the she was very anemic, and it was advised that she be evaluated. Additionally, patient states that she's felt very lightheaded, weak, and tired lately. She denies any unusual vaginal bleeding. She denies seeing any blood in her stool. Patient also states that she recently had an ovarian cyst which was quite large. However, she states this pain is not in her lower abdomen, but is in her upper abdomen.  The history is provided by the patient. No language interpreter was used.    Past Medical History  Diagnosis Date  . Thyroid disease   . Abnormal Pap smear   . Allergy   . Depression   . Rheumatoid arthritis(714.0)   . Anxiety   . Ovarian cyst   . PID (acute pelvic inflammatory disease)    Past Surgical History  Procedure Laterality Date  . Tubal ligation    . Laparoscopic tubal ligation     Family History  Problem Relation Age of Onset  . Diabetes Mother   . Hypertension Mother   . Alcohol abuse Paternal Grandmother   . Hypothyroidism Paternal Grandfather   . Other Neg Hx    History  Substance Use Topics  . Smoking status: Never Smoker   . Smokeless tobacco: Never Used  . Alcohol  Use: No   OB History   Grav Para Term Preterm Abortions TAB SAB Ect Mult Living   4 2 1 1 2 2    2      Review of Systems  All other systems reviewed and are negative.    Allergies  Penicillins and Reglan  Home Medications   Current Outpatient Rx  Name  Route  Sig  Dispense  Refill  . levothyroxine (SYNTHROID, LEVOTHROID) 175 MCG tablet   Oral   Take 1 tablet (175 mcg total) by mouth daily.   30 tablet   0   . meloxicam (MOBIC) 7.5 MG tablet   Oral   Take 1 tablet (7.5 mg total) by mouth daily.   30 tablet   2   . predniSONE (DELTASONE) 5 MG tablet   Oral   Take 1 tablet (5 mg total) by mouth daily.   30 tablet   0   . traMADol (ULTRAM) 50 MG tablet   Oral   Take 1 tablet (50 mg total) by mouth every 6 (six) hours as needed for pain.   30 tablet   0    BP 118/88  Pulse 98  Temp(Src) 98.3 F (36.8 C) (Oral)  Resp 20  SpO2 100%  LMP 06/28/2013 Physical Exam  Nursing note and vitals reviewed. Constitutional: She is oriented to person, place, and time. She appears well-developed and well-nourished.  HENT:  Head: Normocephalic and atraumatic.  Eyes: Conjunctivae and EOM are normal.  Pupils are equal, round, and reactive to light.  Neck: Normal range of motion. Neck supple.  Cardiovascular: Normal rate and regular rhythm.  Exam reveals no gallop and no friction rub.   No murmur heard. Pulmonary/Chest: Effort normal and breath sounds normal. No respiratory distress. She has no wheezes. She has no rales. She exhibits no tenderness.  Abdominal: Soft. She exhibits no distension and no mass. There is tenderness. There is no rebound and no guarding.  Epigastric abdominal tenderness, no other focal abdominal tenderness, no fluid wave, or signs of peritonitis  Musculoskeletal: Normal range of motion. She exhibits no edema and no tenderness.  Neurological: She is alert and oriented to person, place, and time.  Skin: Skin is warm and dry.  Psychiatric: She has a normal  mood and affect. Her behavior is normal. Judgment and thought content normal.    ED Course  Procedures (including critical care time) Labs Review Labs Reviewed  CBC WITH DIFFERENTIAL  COMPREHENSIVE METABOLIC PANEL  LIPASE, BLOOD  URINALYSIS, ROUTINE W REFLEX MICROSCOPIC  TYPE AND SCREEN   Results for orders placed during the hospital encounter of 07/10/13  CBC WITH DIFFERENTIAL      Result Value Range   WBC 9.7  4.0 - 10.5 K/uL   RBC 4.91  3.87 - 5.11 MIL/uL   Hemoglobin 10.1 (*) 12.0 - 15.0 g/dL   HCT 16.1 (*) 09.6 - 04.5 %   MCV 68.6 (*) 78.0 - 100.0 fL   MCH 20.6 (*) 26.0 - 34.0 pg   MCHC 30.0  30.0 - 36.0 g/dL   RDW 40.9 (*) 81.1 - 91.4 %   Platelets 498 (*) 150 - 400 K/uL   Neutrophils Relative % 56  43 - 77 %   Lymphocytes Relative 34  12 - 46 %   Monocytes Relative 5  3 - 12 %   Eosinophils Relative 4  0 - 5 %   Basophils Relative 1  0 - 1 %   Neutro Abs 5.4  1.7 - 7.7 K/uL   Lymphs Abs 3.3  0.7 - 4.0 K/uL   Monocytes Absolute 0.5  0.1 - 1.0 K/uL   Eosinophils Absolute 0.4  0.0 - 0.7 K/uL   Basophils Absolute 0.1  0.0 - 0.1 K/uL   RBC Morphology STOMATOCYTES     Smear Review LARGE PLATELETS PRESENT    COMPREHENSIVE METABOLIC PANEL      Result Value Range   Sodium 134 (*) 135 - 145 mEq/L   Potassium 3.4 (*) 3.5 - 5.1 mEq/L   Chloride 102  96 - 112 mEq/L   CO2 24  19 - 32 mEq/L   Glucose, Bld 78  70 - 99 mg/dL   BUN 12  6 - 23 mg/dL   Creatinine, Ser 7.82  0.50 - 1.10 mg/dL   Calcium 9.2  8.4 - 95.6 mg/dL   Total Protein 8.0  6.0 - 8.3 g/dL   Albumin 3.7  3.5 - 5.2 g/dL   AST 16  0 - 37 U/L   ALT 14  0 - 35 U/L   Alkaline Phosphatase 82  39 - 117 U/L   Total Bilirubin 0.2 (*) 0.3 - 1.2 mg/dL   GFR calc non Af Amer >90  >90 mL/min   GFR calc Af Amer >90  >90 mL/min  LIPASE, BLOOD      Result Value Range   Lipase 18  11 - 59 U/L  URINALYSIS, ROUTINE W REFLEX MICROSCOPIC      Result  Value Range   Color, Urine YELLOW  YELLOW   APPearance CLEAR  CLEAR    Specific Gravity, Urine 1.031 (*) 1.005 - 1.030   pH 6.5  5.0 - 8.0   Glucose, UA NEGATIVE  NEGATIVE mg/dL   Hgb urine dipstick NEGATIVE  NEGATIVE   Bilirubin Urine NEGATIVE  NEGATIVE   Ketones, ur NEGATIVE  NEGATIVE mg/dL   Protein, ur NEGATIVE  NEGATIVE mg/dL   Urobilinogen, UA 1.0  0.0 - 1.0 mg/dL   Nitrite NEGATIVE  NEGATIVE   Leukocytes, UA NEGATIVE  NEGATIVE  POCT PREGNANCY, URINE      Result Value Range   Preg Test, Ur NEGATIVE  NEGATIVE  TYPE AND SCREEN      Result Value Range   ABO/RH(D) O POS     Antibody Screen NEG     Sample Expiration 07/13/2013     US Transvaginal Non-ob  06/20/2013   CLINICAL DATA:  Severe pelvic pain.  Leukocytosis. LMP 05/04/2013.  EXAM: TRANSABDOMINAL AND TRANSVAGINAL ULTRASOUND OF PELVIS  TECHNIQUE: Both transabdominal and transvaginal ultrasound examinations of the pelvis were performed. Transabdominal technique was performed for global imaging of the pelvis including uterus, ovaries, adnexal regions, and pelvic cul-de-sac. It was necessary to proceed with endovaginal exam following the transabdominal exam to visualize the endometrium and ovaries.  COMPARISON:  11/13/2012  FINDINGS: Uterus  Measurements: 7.8 x 4.3 x 5.1 cm. No fibroids or other mass visualized.  Endometrium  Thickness: 10 mm.  No focal abnormality visualized.  Right ovary  Measurements: 3.8 x 2.4 x 2.8 cm. Normal appearance/no adnexal mass.  Left ovary  Measurements: 4.8 x 3.4 x 3.6 cm. A relatively homogeneous hypoechoic lesion is seen involving the left ovary which measures 3.7 x 3.2 x 3.2 cm. This lesion shows no evidence of internal blood flow on color Doppler ultrasound. This has nonspecific characteristics, with most likely differential considerations including hemorrhagic ovarian cyst or endometrioma.  Other findings  Small amount of free fluid in pelvic cul-de-sac.  IMPRESSION: 3.7 cm indeterminate hypoechoic lesion in the left ovary, most likely representing a hemorrhagic ovarian cyst  or endometrioma. Recommend followup by transvaginal pelvic ultrasound in 6-12 weeks. This recommendation follows the consensus statement: Management of Asymptomatic Ovarian and Other Adnexal Cysts Imaged at [place]: Society of Radiologists in Ultrasound Consensus Conference Statement. Radiology 2010; [phone].  Normal in appearance of uterus and right ovary.   Electronically Signed   By: Myles Rosenthal   On: 06/20/2013 14:48   US Pelvis Complete  06/20/2013   CLINICAL DATA:  Severe pelvic pain.  Leukocytosis. LMP 05/04/2013.  EXAM: TRANSABDOMINAL AND TRANSVAGINAL ULTRASOUND OF PELVIS  TECHNIQUE: Both transabdominal and transvaginal ultrasound examinations of the pelvis were performed. Transabdominal technique was performed for global imaging of the pelvis including uterus, ovaries, adnexal regions, and pelvic cul-de-sac. It was necessary to proceed with endovaginal exam following the transabdominal exam to visualize the endometrium and ovaries.  COMPARISON:  11/13/2012  FINDINGS: Uterus  Measurements: 7.8 x 4.3 x 5.1 cm. No fibroids or other mass visualized.  Endometrium  Thickness: 10 mm.  No focal abnormality visualized.  Right ovary  Measurements: 3.8 x 2.4 x 2.8 cm. Normal appearance/no adnexal mass.  Left ovary  Measurements: 4.8 x 3.4 x 3.6 cm. A relatively homogeneous hypoechoic lesion is seen involving the left ovary which measures 3.7 x 3.2 x 3.2 cm. This lesion shows no evidence of internal blood flow on color Doppler ultrasound. This has nonspecific characteristics, with most likely differential considerations including  hemorrhagic ovarian cyst or endometrioma.  Other findings  Small amount of free fluid in pelvic cul-de-sac.  IMPRESSION: 3.7 cm indeterminate hypoechoic lesion in the left ovary, most likely representing a hemorrhagic ovarian cyst or endometrioma. Recommend followup by transvaginal pelvic ultrasound in 6-12 weeks. This recommendation follows the consensus statement: Management of  Asymptomatic Ovarian and Other Adnexal Cysts Imaged at [place]: Society of Radiologists in Ultrasound Consensus Conference Statement. Radiology 2010; [phone].  Normal in appearance of uterus and right ovary.   Electronically Signed   By: Myles Rosenthal   On: 06/20/2013 14:48   Dg Abd Acute W/chest  07/10/2013   *RADIOLOGY REPORT*  Clinical Data: Abdominal pain, constipation, rule out free air.  ACUTE ABDOMEN SERIES (ABDOMEN 2 VIEW & CHEST 1 VIEW)  Comparison: Chest radiograph October 23, 2010  Findings: The cardiomediastinal silhouette is unremarkable.  The lungs are clear without pleural effusions or focal consolidations. The pulmonary vasculature is unremarkable.   Trachea projects midline and there is no pneumothorax.  The included soft tissue planes and osseous structures are unremarkable.  Bowel gas pattern is non dilated and nonobstructive.  No intra- abdominal mass effect, pathologic calcifications or free air.  Mild amount of retained large bowel stool.  Phleboliths in the pelvis. Soft tissue planes and included osseous structures are not suspicious.  IMPRESSION: No acute cardiopulmonary process.  Mild amount of retained large bowel stool, nonspecific bowel gas pattern, no intra abdominal free air.   Original Report Authenticated By: Awilda Metro     Imaging Review No results found.  EKG Interpretation   None       MDM   1. GERD (gastroesophageal reflux disease)     Patient with reported anemia, and abdominal pain. Will check basic labs, plain film of abdomen and chest to rule out free air. Will reevaluate.  Labs are reassuring, the patient is a little anemic, but is stable. She is actually higher than her prior CBCs. Patient is well-appearing. Pain is reduced to the emergency department. Could be biliary colic, but no elevated enzymes, no further workup tonight.  Will treat with GI cocktail, protonix and discharge with PPI.    Discussed the patient with Dr. Fayrene Fearing, who agrees with  the plan.  Roxy Horseman, PA-C 07/11/13 [phone]

## 2013-07-11 MED ORDER — ONDANSETRON 8 MG PO TBDP
8.0000 mg | ORAL_TABLET | Freq: Once | ORAL | Status: AC
  Administered 2013-07-11: 8 mg via ORAL
  Filled 2013-07-11: qty 1

## 2013-07-11 MED ORDER — HYDROCODONE-ACETAMINOPHEN 5-325 MG PO TABS
2.0000 | ORAL_TABLET | Freq: Once | ORAL | Status: AC
Start: 2013-07-11 — End: 2013-07-11
  Administered 2013-07-11: 2 via ORAL
  Filled 2013-07-11: qty 2

## 2013-07-11 MED ORDER — OMEPRAZOLE 20 MG PO CPDR: 20.0000 mg | DELAYED_RELEASE_CAPSULE | Freq: Every day | ORAL | Status: DC

## 2013-07-11 MED ORDER — HYDROCODONE-ACETAMINOPHEN 5-325 MG PO TABS: 2.0000 | ORAL_TABLET | Freq: Four times a day (QID) | ORAL | Status: DC | PRN

## 2013-07-11 MED ORDER — PANTOPRAZOLE SODIUM 40 MG IV SOLR
40.0000 mg | INTRAVENOUS | Status: AC
  Administered 2013-07-11: 40 mg via INTRAVENOUS
  Filled 2013-07-11: qty 40

## 2013-07-11 MED ORDER — GI COCKTAIL ~~LOC~~
30.0000 mL | Freq: Once | ORAL | Status: AC
  Administered 2013-07-11: 30 mL via ORAL
  Filled 2013-07-11: qty 30

## 2013-07-11 NOTE — ED Provider Notes (Signed)
Medical screening examination/treatment/procedure(s) were performed by non-physician practitioner and as supervising physician I was immediately available for consultation/collaboration.   Arish Redner J Jishnu Jenniges, MD 07/11/13 2214 

## 2013-07-13 ENCOUNTER — Encounter: Payer: Self-pay | Admitting: Obstetrics and Gynecology

## 2013-07-14 ENCOUNTER — Emergency Department (HOSPITAL_COMMUNITY)
Admission: EM | Admit: 2013-07-14 | Discharge: 2013-07-14 | Disposition: A | Discharge status: Home / Self Care | Payer: Medicaid Other | Attending: Emergency Medicine | Admitting: Emergency Medicine

## 2013-07-14 ENCOUNTER — Encounter (HOSPITAL_COMMUNITY): Payer: Self-pay | Admitting: Emergency Medicine

## 2013-07-14 DIAGNOSIS — Z79899 Other long term (current) drug therapy: Secondary | ICD-10-CM | POA: Insufficient documentation

## 2013-07-14 DIAGNOSIS — J029 Acute pharyngitis, unspecified: Secondary | ICD-10-CM | POA: Insufficient documentation

## 2013-07-14 DIAGNOSIS — M069 Rheumatoid arthritis, unspecified: Secondary | ICD-10-CM | POA: Insufficient documentation

## 2013-07-14 DIAGNOSIS — R05 Cough: Secondary | ICD-10-CM | POA: Insufficient documentation

## 2013-07-14 DIAGNOSIS — Z791 Long term (current) use of non-steroidal anti-inflammatories (NSAID): Secondary | ICD-10-CM | POA: Insufficient documentation

## 2013-07-14 DIAGNOSIS — IMO0002 Reserved for concepts with insufficient information to code with codable children: Secondary | ICD-10-CM | POA: Insufficient documentation

## 2013-07-14 DIAGNOSIS — Z8742 Personal history of other diseases of the female genital tract: Secondary | ICD-10-CM | POA: Insufficient documentation

## 2013-07-14 DIAGNOSIS — Z880 Allergy status to penicillin: Secondary | ICD-10-CM | POA: Insufficient documentation

## 2013-07-14 DIAGNOSIS — R059 Cough, unspecified: Secondary | ICD-10-CM | POA: Insufficient documentation

## 2013-07-14 DIAGNOSIS — E079 Disorder of thyroid, unspecified: Secondary | ICD-10-CM | POA: Insufficient documentation

## 2013-07-14 DIAGNOSIS — D649 Anemia, unspecified: Secondary | ICD-10-CM | POA: Insufficient documentation

## 2013-07-14 DIAGNOSIS — Z8659 Personal history of other mental and behavioral disorders: Secondary | ICD-10-CM | POA: Insufficient documentation

## 2013-07-14 DIAGNOSIS — R509 Fever, unspecified: Secondary | ICD-10-CM | POA: Insufficient documentation

## 2013-07-14 LAB — RAPID STREP SCREEN (MED CTR MEBANE ONLY): Streptococcus, Group A Screen (Direct): NEGATIVE

## 2013-07-14 MED ORDER — PREDNISONE 20 MG PO TABS
60.0000 mg | ORAL_TABLET | Freq: Once | ORAL | Status: AC
  Administered 2013-07-14: 60 mg via ORAL
  Filled 2013-07-14: qty 3

## 2013-07-14 MED ORDER — HYDROCODONE-ACETAMINOPHEN 7.5-325 MG/15ML PO SOLN
15.0000 mL | Freq: Once | ORAL | Status: AC
  Administered 2013-07-14: 15 mL via ORAL
  Filled 2013-07-14: qty 15

## 2013-07-14 MED ORDER — HYDROCODONE-ACETAMINOPHEN 7.5-325 MG/15ML PO SOLN: 15.0000 mL | Freq: Four times a day (QID) | ORAL | Status: DC | PRN

## 2013-07-14 NOTE — ED Notes (Signed)
Pt c/o sore throat, chills onset yesterday. Pt reports pain with eating and drinking.

## 2013-07-14 NOTE — ED Provider Notes (Signed)
CSN: [id number]     Arrival date & time 07/14/13  2110 History  This chart was scribed for non-physician practitioner Kyung Bacca, PA-C working with No att. providers found by Caryn Bee, ED Scribe. This patient was seen in room WTR7/WTR7 and the patient's care was started at 10:30 PM.    Chief Complaint  Patient presents with  . Sore Throat    Patient is a 32 y.o. female presenting with pharyngitis. The history is provided by the patient. No language interpreter was used.  Sore Throat This is a new problem. The current episode started yesterday. The problem occurs constantly. The problem has not changed since onset.The symptoms are aggravated by swallowing. Nothing relieves the symptoms. She has tried nothing for the symptoms.   HPI Comments: Dana Elliott is a 32 y.o. female who presents to the Emergency Department complaining of gradual onset moderate sore throat that began 07/13/2013. Pt was recently diagnosed with GERD and a stomach ulcer. Pt states that yesterday she had a cough, fever (temp 99.1), chills, and developed a sore throat. She states that pain aggravated by eating and drinking. Pt denies rhinorrhea, nasal congestion, or any other symptoms.  Cough has resolved.  Pt has not been around anyone with strep throat that she knows of. Pt has h/o anemia, iron deficiency, and RA. Pt recently began taking methotrexate.   Past Medical History  Diagnosis Date  . Thyroid disease   . Abnormal Pap smear   . Allergy   . Depression   . Rheumatoid arthritis(714.0)   . Anxiety   . Ovarian cyst   . PID (acute pelvic inflammatory disease)    Past Surgical History  Procedure Laterality Date  . Tubal ligation    . Laparoscopic tubal ligation     Family History  Problem Relation Age of Onset  . Diabetes Mother   . Hypertension Mother   . Alcohol abuse Paternal Grandmother   . Hypothyroidism Paternal Grandfather   . Other Neg Hx    History  Substance Use Topics  . Smoking  status: Never Smoker   . Smokeless tobacco: Never Used  . Alcohol Use: No   OB History   Grav Para Term Preterm Abortions TAB SAB Ect Mult Living   4 2 1 1 2 2    2      Review of Systems  Constitutional: Positive for fever and chills.  HENT: Positive for sore throat, trouble swallowing and voice change. Negative for rhinorrhea.   Respiratory: Positive for cough.   All other systems reviewed and are negative.    Allergies  Penicillins and Reglan  Home Medications   Current Outpatient Rx  Name  Route  Sig  Dispense  Refill  . ferrous sulfate 325 (65 FE) MG tablet   Oral   Take 325 mg by mouth daily with breakfast.         . HYDROcodone-acetaminophen (NORCO/VICODIN) 5-325 MG per tablet   Oral   Take 2 tablets by mouth every 6 (six) hours as needed for pain.   15 tablet   0   . levothyroxine (SYNTHROID, LEVOTHROID) 175 MCG tablet   Oral   Take 1 tablet (175 mcg total) by mouth daily.   30 tablet   0   . meloxicam (MOBIC) 7.5 MG tablet   Oral   Take 1 tablet (7.5 mg total) by mouth daily.   30 tablet   2   . omeprazole (PRILOSEC) 20 MG capsule   Oral   Take  1 capsule (20 mg total) by mouth daily.   30 capsule   0   . predniSONE (DELTASONE) 5 MG tablet   Oral   Take 5 mg by mouth daily as needed (arthritis flare).         . traMADol (ULTRAM) 50 MG tablet   Oral   Take 1 tablet (50 mg total) by mouth every 6 (six) hours as needed for pain.   30 tablet   0    Triage Vitals: BP 128/83  Pulse 125  Temp(Src) 99 F (37.2 C) (Oral)  Resp 20  Ht 5\' 6"  (1.676 m)  Wt 195 lb (88.451 kg)  BMI 31.49 kg/m2  SpO2 99%  LMP 06/28/2013  Physical Exam  Nursing note and vitals reviewed. Constitutional: She is oriented to person, place, and time. She appears well-developed and well-nourished. No distress.  HENT:  Head: Normocephalic and atraumatic.  Mildly edematous and symmetric tonsils w/out erythema or exudate.  Uvula mid-line and no trismus.  Eyes:   Normal appearance  Neck: Normal range of motion.  Cardiovascular: Normal rate and regular rhythm.   Pulmonary/Chest: Effort normal and breath sounds normal. No respiratory distress.  Musculoskeletal: Normal range of motion.  Lymphadenopathy:    She has no cervical adenopathy.  Neurological: She is alert and oriented to person, place, and time.  Skin: Skin is warm and dry. No rash noted.  Psychiatric: She has a normal mood and affect. Her behavior is normal.    ED Course  Procedures (including critical care time) DIAGNOSTIC STUDIES: Oxygen Saturation is 99% on room air, normal by my interpretation.     Labs Review Labs Reviewed - No data to display Imaging Review No results found.  EKG Interpretation   None       MDM  No diagnosis found. 32yo F on methotrexate for RA presents w/ pharyngitis.  Low clinical suspicion for strep and rapid strep screen neg.   Will treat symptomatically for viral etiology.  Pt received 60mg  prednisone + lortab elixir in ED.  Will prescribe lortab elixir and recommend NSAID at home.  She is mildly tachycardic but suspect that it is secondary to decreased po intake as well as pain level.  Return precautions discussed.    I personally performed the services described in this documentation, which was scribed in my presence. The recorded information has been reviewed and is accurate.    Otilio Miu, PA-C 07/14/13 [phone]

## 2013-07-15 NOTE — ED Provider Notes (Signed)
Medical screening examination/treatment/procedure(s) were performed by non-physician practitioner and as supervising physician I was immediately available for consultation/collaboration.     Celene Kras, MD 07/15/13 [phone]

## 2013-07-16 LAB — CULTURE, GROUP A STREP

## 2013-07-26 ENCOUNTER — Emergency Department (HOSPITAL_COMMUNITY)
Admission: EM | Admit: 2013-07-26 | Discharge: 2013-07-27 | Disposition: A | Discharge status: Home / Self Care | Payer: Medicaid Other | Attending: Emergency Medicine | Admitting: Emergency Medicine

## 2013-07-26 ENCOUNTER — Encounter (HOSPITAL_COMMUNITY): Payer: Self-pay | Admitting: Emergency Medicine

## 2013-07-26 ENCOUNTER — Emergency Department (HOSPITAL_COMMUNITY): Payer: Medicaid Other

## 2013-07-26 DIAGNOSIS — Z791 Long term (current) use of non-steroidal anti-inflammatories (NSAID): Secondary | ICD-10-CM | POA: Insufficient documentation

## 2013-07-26 DIAGNOSIS — F329 Major depressive disorder, single episode, unspecified: Secondary | ICD-10-CM | POA: Insufficient documentation

## 2013-07-26 DIAGNOSIS — IMO0001 Reserved for inherently not codable concepts without codable children: Secondary | ICD-10-CM | POA: Insufficient documentation

## 2013-07-26 DIAGNOSIS — Z79899 Other long term (current) drug therapy: Secondary | ICD-10-CM | POA: Insufficient documentation

## 2013-07-26 DIAGNOSIS — E079 Disorder of thyroid, unspecified: Secondary | ICD-10-CM | POA: Insufficient documentation

## 2013-07-26 DIAGNOSIS — M542 Cervicalgia: Secondary | ICD-10-CM | POA: Insufficient documentation

## 2013-07-26 DIAGNOSIS — M25519 Pain in unspecified shoulder: Secondary | ICD-10-CM | POA: Insufficient documentation

## 2013-07-26 DIAGNOSIS — F3289 Other specified depressive episodes: Secondary | ICD-10-CM | POA: Insufficient documentation

## 2013-07-26 DIAGNOSIS — Z880 Allergy status to penicillin: Secondary | ICD-10-CM | POA: Insufficient documentation

## 2013-07-26 DIAGNOSIS — Z8742 Personal history of other diseases of the female genital tract: Secondary | ICD-10-CM | POA: Insufficient documentation

## 2013-07-26 DIAGNOSIS — F411 Generalized anxiety disorder: Secondary | ICD-10-CM | POA: Insufficient documentation

## 2013-07-26 DIAGNOSIS — M069 Rheumatoid arthritis, unspecified: Secondary | ICD-10-CM | POA: Insufficient documentation

## 2013-07-26 DIAGNOSIS — M79601 Pain in right arm: Secondary | ICD-10-CM

## 2013-07-26 MED ORDER — DIAZEPAM 5 MG PO TABS
10.0000 mg | ORAL_TABLET | Freq: Once | ORAL | Status: AC
  Administered 2013-07-26: 10 mg via ORAL
  Filled 2013-07-26: qty 2

## 2013-07-26 NOTE — ED Notes (Signed)
Bed: WTR5 Expected date:  Expected time:  Means of arrival:  Comments: EMS rt arm pain x 2 days

## 2013-07-26 NOTE — ED Notes (Signed)
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 Tonight the pain worsens and radiates to her neck. Pt complains to EMS that she has tingling in her fingers of the right hand as well as weakened grasp of the hand.

## 2013-07-26 NOTE — ED Provider Notes (Signed)
CSN: [id number]     Arrival date & time 07/26/13  2221 History   This chart was scribed for non-physician practitioner Junius Finner, PA-C working with Richardean Canal, MD by Caryn Bee, ED Scribe. This patient was seen in room WTR5/WTR5 and the patient's care was started at 11:20 PM.      Chief Complaint  Patient presents with  . Arm Pain    right   HPI HPI Comments: Dana Elliott is a 32 y.o. female brought in by ambulance, who presents to the Emergency Department complaining of sudden onset severe right shoulder pain onset today. Pt states that the pain radiates down her right arm and into her hand. Pain is sharp in nature. She complains of her right arm falling asleep a few hours ago while she was out with a friend. Pt states that she then got in her car to drive home but her arm felt cold and weak so she called EMS. She reports lifting a dog 2 days ago in a vet's office that weighed 80-90 pounds and having left shoulder pain afterwards. Today she states left shoulder pain is mild, her biggest concern is that she may have a blood clot in her neck causing the pain in her right arm.  Pt takes prescription meloxicam, tramadol and medication for RA daily. She denies h/o clotting disorder. Denies any falls or trauma to right shoulder or arm.    Past Medical History  Diagnosis Date  . Thyroid disease   . Abnormal Pap smear   . Allergy   . Depression   . Rheumatoid arthritis(714.0)   . Anxiety   . Ovarian cyst   . PID (acute pelvic inflammatory disease)    Past Surgical History  Procedure Laterality Date  . Tubal ligation    . Laparoscopic tubal ligation     Family History  Problem Relation Age of Onset  . Diabetes Mother   . Hypertension Mother   . Alcohol abuse Paternal Grandmother   . Hypothyroidism Paternal Grandfather   . Other Neg Hx    History  Substance Use Topics  . Smoking status: Never Smoker   . Smokeless tobacco: Never Used  . Alcohol Use: No   OB History   Grav  Para Term Preterm Abortions TAB SAB Ect Mult Living   4 2 1 1 2 2    2      Review of Systems  Musculoskeletal: Positive for arthralgias, myalgias and neck pain. Negative for neck stiffness.  Skin: Negative for rash and wound.  All other systems reviewed and are negative.    Allergies  Penicillins and Reglan  Home Medications   Current Outpatient Rx  Name  Route  Sig  Dispense  Refill  . ferrous sulfate 325 (65 FE) MG tablet   Oral   Take 325 mg by mouth daily with breakfast.         . HYDROcodone-acetaminophen (HYCET) 7.5-325 mg/15 ml solution   Oral   Take 15 mLs by mouth 4 (four) times daily as needed for pain.   120 mL   0   . HYDROcodone-acetaminophen (NORCO/VICODIN) 5-325 MG per tablet   Oral   Take 2 tablets by mouth every 6 (six) hours as needed for pain.   15 tablet   0   . ibuprofen (ADVIL,MOTRIN) 200 MG tablet   Oral   Take 200 mg by mouth every 6 (six) hours as needed for pain.         Marland Kitchen levothyroxine (  SYNTHROID, LEVOTHROID) 175 MCG tablet   Oral   Take 1 tablet (175 mcg total) by mouth daily.   30 tablet   0   . meloxicam (MOBIC) 7.5 MG tablet   Oral   Take 1 tablet (7.5 mg total) by mouth daily.   30 tablet   2   . omeprazole (PRILOSEC) 20 MG capsule   Oral   Take 1 capsule (20 mg total) by mouth daily.   30 capsule   0   . predniSONE (DELTASONE) 5 MG tablet   Oral   Take 5 mg by mouth daily as needed (arthritis flare).         . traMADol (ULTRAM) 50 MG tablet   Oral   Take 1 tablet (50 mg total) by mouth every 6 (six) hours as needed for pain.   30 tablet   0    Triage Vitals: BP 127/77  Pulse 144  Temp(Src) 98.6 F (37 C) (Oral)  Resp 24  SpO2 100%  LMP 07/04/2013  Physical Exam  Nursing note and vitals reviewed. Constitutional: She is oriented to person, place, and time. She appears well-developed and well-nourished. No distress.  HENT:  Head: Normocephalic and atraumatic.  Eyes: Conjunctivae are normal. No scleral  icterus.  Neck: Normal range of motion. Neck supple. No JVD present. No tracheal deviation present. No thyromegaly present.  FROM. No midline cervical tenderness, crepitus or step-offs.  Cardiovascular: Normal rate, regular rhythm, normal heart sounds and intact distal pulses.   Carotid arteries auscultated: no bruits heard.  Pulmonary/Chest: Effort normal and breath sounds normal. No stridor. No respiratory distress. She has no wheezes. She has no rales. She exhibits no tenderness.  Abdominal: Soft. Bowel sounds are normal. She exhibits no distension and no mass. There is no tenderness. There is no rebound and no guarding.  Musculoskeletal: Normal range of motion. She exhibits tenderness ( tenderness along right and left SCM, right shoulder). She exhibits no edema.  FROM right and left shoulder. 4/5 grip strength in right hand, felt to be due to lack of effort.  Radial pulses 2+ bilaterally.   Lymphadenopathy:    She has no cervical adenopathy.  Neurological: She is alert and oriented to person, place, and time. No cranial nerve deficit.  Skin: Skin is warm and dry. She is not diaphoretic.    ED Course  Procedures (including critical care time) DIAGNOSTIC STUDIES: Oxygen Saturation is 100% on room air, normal by my interpretation.    COORDINATION OF CARE: 11:30 PM-Discussed treatment plan with pt at bedside and pt agreed to plan.   Labs Review Labs Reviewed - No data to display Imaging Review Dg Neck Soft Tissue  07/27/2013   CLINICAL DATA:  Throat pain.  EXAM: NECK SOFT TISSUES - 1+ VIEW  COMPARISON:  Cervical spine radiograph 10/19/2012.  FINDINGS: Limitations include non imaging of the nasopharynx and underpenetration that decreases resolution.  No evidence of prevertebral or epiglottic thickening. The infraglottic airway is patent. The imaged cervical spine is normal.  IMPRESSION: 1. No evidence of epiglottitis or retropharyngeal abscess. 2. Limitations noted above.   Electronically  Signed   By: Tiburcio Pea M.D.   On: 07/27/2013 00:16   Dg Chest 2 View  07/27/2013   CLINICAL DATA:  Shortness of breath.  EXAM: CHEST  2 VIEW  COMPARISON:  07/10/2013  FINDINGS: The heart size and mediastinal contours are within normal limits. Both lungs are clear of edema or consolidation. No effusion or pneumothorax. The visualized skeletal  structures are unremarkable.  IMPRESSION: No evidence of active cardiopulmonary disease.   Electronically Signed   By: Tiburcio Pea M.D.   On: 07/27/2013 00:14    EKG Interpretation   None       MDM   1. Right arm pain    Pt presenting with right arm pain, she is concerned she has a blood clot in her neck causing symptoms.  Pt has FROM right shoulder, radial pulses 2+ bilaterally. 4/5 grip strength in right hand felt to be due to lack of effort.   Discussed pt with Dr. Nicanor Alcon who also examined pt, low concern for blood clot or thoracic outlet syndrome.  Pt has had normal CT head and neck last year, will get plain films of soft tissue neck and CXR to ensure no extra bone growth or possible anomaly causing pt to be higher risk for thoracic outlet syndrome.  If normal, will discharge pt home with PCP f/u.  While waiting for imaging to be read, pt was witnessed walking to restroom swinging both arms equaling with normal gait.  Pt has been here numerous times for medication refills and has had 5 different prescriptions for tramadol and vicodin prescribed by 5 different providers within the last month alone.   Upon attempting to review imaging and treatment plan, pt became argumentative and demanding for more imaging until we found out what was wrong with her.  Attempted to discuss with pt we did not believe any life threatening emergency was taking place at this time and she will need to follow up with an orthopedist and a PCP for ongoing shoulder pain.  Pt stated she was going to go to another hospital if we did not give her narcotics for her pain.   Security was called to help discharge pt.  Discharge papers given to patient included information for referral to Dr. Ave Filter, orthopedic surgeon who specializes in shoulders.  I personally performed the services described in this documentation, which was scribed in my presence. The recorded information has been reviewed and is accurate.   Junius Finner, PA-C 07/27/13 0050  Junius Finner, PA-C 07/27/13 [phone]
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
## 2013-07-27 NOTE — ED Notes (Signed)
Pt complaining that she perceives that no one of staff cares about her pain, despite receiving medication and having multiple health professionals assessing her and reviewing her care. Charge RN well as Database administrator has come to pts room and heard her complaints. Pt will leave with discharge paperwork but refuses to sign.

## 2013-07-28 NOTE — ED Provider Notes (Signed)
Medical screening examination/treatment/procedure(s) were performed by non-physician practitioner and as supervising physician I was immediately available for consultation/collaboration.    Gilda Crease, MD 07/28/13 1537

## 2013-08-22 ENCOUNTER — Encounter (HOSPITAL_COMMUNITY): Payer: Self-pay | Admitting: Emergency Medicine

## 2013-08-22 ENCOUNTER — Emergency Department (HOSPITAL_COMMUNITY)
Admission: EM | Admit: 2013-08-22 | Discharge: 2013-08-22 | Disposition: A | Discharge status: Home / Self Care | Payer: Medicaid Other | Attending: Emergency Medicine | Admitting: Emergency Medicine

## 2013-08-22 DIAGNOSIS — Z8742 Personal history of other diseases of the female genital tract: Secondary | ICD-10-CM | POA: Insufficient documentation

## 2013-08-22 DIAGNOSIS — Z791 Long term (current) use of non-steroidal anti-inflammatories (NSAID): Secondary | ICD-10-CM | POA: Insufficient documentation

## 2013-08-22 DIAGNOSIS — F3289 Other specified depressive episodes: Secondary | ICD-10-CM | POA: Insufficient documentation

## 2013-08-22 DIAGNOSIS — Z880 Allergy status to penicillin: Secondary | ICD-10-CM | POA: Insufficient documentation

## 2013-08-22 DIAGNOSIS — Z760 Encounter for issue of repeat prescription: Secondary | ICD-10-CM | POA: Insufficient documentation

## 2013-08-22 DIAGNOSIS — F411 Generalized anxiety disorder: Secondary | ICD-10-CM | POA: Insufficient documentation

## 2013-08-22 DIAGNOSIS — M069 Rheumatoid arthritis, unspecified: Secondary | ICD-10-CM | POA: Insufficient documentation

## 2013-08-22 DIAGNOSIS — F329 Major depressive disorder, single episode, unspecified: Secondary | ICD-10-CM | POA: Insufficient documentation

## 2013-08-22 DIAGNOSIS — Z79899 Other long term (current) drug therapy: Secondary | ICD-10-CM | POA: Insufficient documentation

## 2013-08-22 DIAGNOSIS — E079 Disorder of thyroid, unspecified: Secondary | ICD-10-CM | POA: Insufficient documentation

## 2013-08-22 NOTE — ED Provider Notes (Signed)
CSN: [id number]     Arrival date & time 08/22/13  2212 History  This chart was scribed for non-physician practitioner Sharilyn Sites, PA-C working with Geoffery Lyons, MD by Valera Castle, ED scribe. This patient was seen in room TR08C/TR08C and the patient's care was started at 10:57 PM.   Chief Complaint  Patient presents with  . Medication Refill   The history is provided by the patient. No language interpreter was used.   HPI Comments: Dana Elliott is a 32 y.o. female with a h/o arthritis who presents to the Emergency Department requesting a Rx refill for Mobic and Tramadol.   She reports she ran out of her medication Sunday night, and calling her PCP Monday morning, but had trouble getting her Rx refill authorized due to her PCP being out of town. She is frustrated due to not being able to get her Rx authorized, and was told to be seen at an Urgent Care or ED by the RN. She states she has an appointment with her PCP on the 12/08.  States she has tried Tylenol, since she is out of her meds, without relief. She is worried about withdrawals from the medication, stating she feels like her body is reacting to not taking the medication regularly.  Pt has had no nausea, vomiting, tremors, or seizure activity.  No prior hx of DT's. PCP - Provider Not In System  Past Medical History  Diagnosis Date  . Thyroid disease   . Abnormal Pap smear   . Allergy   . Depression   . Rheumatoid arthritis(714.0)   . Anxiety   . Ovarian cyst   . PID (acute pelvic inflammatory disease)    Past Surgical History  Procedure Laterality Date  . Tubal ligation    . Laparoscopic tubal ligation     Family History  Problem Relation Age of Onset  . Diabetes Mother   . Hypertension Mother   . Alcohol abuse Paternal Grandmother   . Hypothyroidism Paternal Grandfather   . Other Neg Hx    History  Substance Use Topics  . Smoking status: Never Smoker   . Smokeless tobacco: Never Used  . Alcohol Use: No   OB  History   Grav Para Term Preterm Abortions TAB SAB Ect Mult Living   4 2 1 1 2 2    2      Review of Systems  Constitutional:       Med refill  All other systems reviewed and are negative.   Allergies  Penicillins and Reglan  Home Medications   Current Outpatient Rx  Name  Route  Sig  Dispense  Refill  . ferrous sulfate 325 (65 FE) MG tablet   Oral   Take 325 mg by mouth daily with breakfast.         . HYDROcodone-acetaminophen (HYCET) 7.5-325 mg/15 ml solution   Oral   Take 15 mLs by mouth 4 (four) times daily as needed for pain.   120 mL   0   . HYDROcodone-acetaminophen (NORCO/VICODIN) 5-325 MG per tablet   Oral   Take 2 tablets by mouth every 6 (six) hours as needed for pain.   15 tablet   0   . ibuprofen (ADVIL,MOTRIN) 200 MG tablet   Oral   Take 200 mg by mouth every 6 (six) hours as needed for pain.         Marland Kitchen levothyroxine (SYNTHROID, LEVOTHROID) 175 MCG tablet   Oral   Take 1 tablet (175 mcg  total) by mouth daily.   30 tablet   0   . meloxicam (MOBIC) 7.5 MG tablet   Oral   Take 1 tablet (7.5 mg total) by mouth daily.   30 tablet   2   . omeprazole (PRILOSEC) 20 MG capsule   Oral   Take 1 capsule (20 mg total) by mouth daily.   30 capsule   0   . predniSONE (DELTASONE) 5 MG tablet   Oral   Take 5 mg by mouth daily as needed (arthritis flare).         . traMADol (ULTRAM) 50 MG tablet   Oral   Take 1 tablet (50 mg total) by mouth every 6 (six) hours as needed for pain.   30 tablet   0    Triage Vitals: BP 123/75  Pulse 92  Temp(Src) 98.2 F (36.8 C) (Oral)  Resp 18  Wt 212 lb 14.4 oz (96.571 kg)  SpO2 99%  LMP 07/04/2013  Physical Exam  Nursing note and vitals reviewed. Constitutional: She is oriented to person, place, and time. She appears well-developed and well-nourished.  HENT:  Head: Normocephalic and atraumatic.  Mouth/Throat: Oropharynx is clear and moist.  Eyes: Conjunctivae and EOM are normal. Pupils are equal,  round, and reactive to light.  Neck: Normal range of motion.  Cardiovascular: Normal rate, regular rhythm and normal heart sounds.   Pulmonary/Chest: Effort normal and breath sounds normal.  Abdominal: Soft. Bowel sounds are normal.  Musculoskeletal: Normal range of motion.  Neurological: She is alert and oriented to person, place, and time. She has normal strength. She displays no tremor. No cranial nerve deficit or sensory deficit. She displays no seizure activity. Gait normal.  Skin: Skin is warm and dry.  Psychiatric: She has a normal mood and affect.    ED Course  Procedures (including critical care time)  DIAGNOSTIC STUDIES: Oxygen Saturation is 99% on room air, normal by my interpretation.    COORDINATION OF CARE: 11:08 PM -  Pt informed she would not be receiving refills of her medications.  Pt instructed to call her PCP office.  Pt angry with this plan.  Labs Review Labs Reviewed - No data to display Imaging Review No results found.  EKG Interpretation   None       MDM   1. Encounter for medication refill    Pt requesting medication refill, seen multiple times in the ED for the same.  Pt has no signs/sx concerning with withdrawal at this time.  I have declined to refill pts meds-- had scripts filled on 07/30/13 and 08/02/13 for total of 90 tramadol already this month by different providers.  Pt became angry when told she was not getting medications refills.  I have instructed her to call her PCP and try to get an earlier appt.  Pt remains angry stating "this was a waste of my time and money if you're not going to help me".  Informed pt that we do not manage chronic pain in the ED.  Pt left prior to receiving discharge papers.  Walked out of the ED without difficulty.  I personally performed the services described in this documentation, which was scribed in my presence. The recorded information has been reviewed and is accurate.  Garlon Hatchet, PA-C 08/22/13 [phone]

## 2013-08-22 NOTE — ED Notes (Signed)
Pt refuses to wait for discharge papers to be printed. Pt states why does she need papers when nothing was done for her and then she walked out.

## 2013-08-22 NOTE — ED Notes (Signed)
Pt states that she called her PCP and he is out of town. Pt wanted to have medication refilled. RN told her to come here and be seen for the med refill. Pt wants refills on melaxicam, tramadol.

## 2013-08-22 NOTE — ED Notes (Signed)
Pt needs a refill for 7.5 meloxicam (1 tab po BID) and 50 mg Tramadol (4x day).

## 2013-08-23 NOTE — ED Provider Notes (Signed)
Medical screening examination/treatment/procedure(s) were performed by non-physician practitioner and as supervising physician I was immediately available for consultation/collaboration.     Glenford Garis, MD 08/23/13 0001 

## 2013-09-13 ENCOUNTER — Emergency Department: Payer: Self-pay | Admitting: Emergency Medicine

## 2013-09-13 LAB — CBC WITH DIFFERENTIAL/PLATELET
Basophil #: 0.1 10*3/uL (ref 0.0–0.1)
Basophil %: 0.8 %
Eosinophil #: 0.2 10*3/uL (ref 0.0–0.7)
Eosinophil %: 1.7 %
HCT: 33.5 % — ABNORMAL LOW (ref 35.0–47.0)
HGB: 10.4 g/dL — ABNORMAL LOW (ref 12.0–16.0)
Lymphocyte #: 2 10*3/uL (ref 1.0–3.6)
Lymphocyte %: 19.2 %
MCHC: 31 g/dL — ABNORMAL LOW (ref 32.0–36.0)
MCV: 66 fL — ABNORMAL LOW (ref 80–100)
Neutrophil %: 73.1 %
WBC: 10.4 10*3/uL (ref 3.6–11.0)

## 2013-09-13 LAB — URINALYSIS, COMPLETE
Bacteria: NONE SEEN
Bilirubin,UR: NEGATIVE
Glucose,UR: NEGATIVE mg/dL (ref 0–75)
Ketone: NEGATIVE
Leukocyte Esterase: NEGATIVE
Protein: NEGATIVE
RBC,UR: 1 /HPF (ref 0–5)
Specific Gravity: 1.008 (ref 1.003–1.030)
Squamous Epithelial: 2
WBC UR: 1 /HPF (ref 0–5)

## 2013-09-13 LAB — COMPREHENSIVE METABOLIC PANEL
Alkaline Phosphatase: 94 U/L
Anion Gap: 6 — ABNORMAL LOW (ref 7–16)
BUN: 9 mg/dL (ref 7–18)
Bilirubin,Total: 0.2 mg/dL (ref 0.2–1.0)
Calcium, Total: 9.4 mg/dL (ref 8.5–10.1)
Chloride: 103 mmol/L (ref 98–107)
Co2: 26 mmol/L (ref 21–32)
EGFR (African American): >60
SGOT(AST): 21 U/L (ref 15–37)
Sodium: 135 mmol/L — ABNORMAL LOW (ref 136–145)
Total Protein: 8.3 g/dL — ABNORMAL HIGH (ref 6.4–8.2)

## 2013-09-29 ENCOUNTER — Encounter (HOSPITAL_BASED_OUTPATIENT_CLINIC_OR_DEPARTMENT_OTHER): Payer: Self-pay | Admitting: Emergency Medicine

## 2013-09-29 DIAGNOSIS — S0993XA Unspecified injury of face, initial encounter: Secondary | ICD-10-CM | POA: Insufficient documentation

## 2013-09-29 DIAGNOSIS — S298XXA Other specified injuries of thorax, initial encounter: Secondary | ICD-10-CM | POA: Insufficient documentation

## 2013-09-29 DIAGNOSIS — M069 Rheumatoid arthritis, unspecified: Secondary | ICD-10-CM | POA: Insufficient documentation

## 2013-09-29 DIAGNOSIS — S199XXA Unspecified injury of neck, initial encounter: Secondary | ICD-10-CM

## 2013-09-29 DIAGNOSIS — Y9389 Activity, other specified: Secondary | ICD-10-CM | POA: Insufficient documentation

## 2013-09-29 DIAGNOSIS — Z8742 Personal history of other diseases of the female genital tract: Secondary | ICD-10-CM | POA: Insufficient documentation

## 2013-09-29 DIAGNOSIS — Z880 Allergy status to penicillin: Secondary | ICD-10-CM | POA: Insufficient documentation

## 2013-09-29 DIAGNOSIS — R000 Tachycardia, unspecified: Secondary | ICD-10-CM | POA: Insufficient documentation

## 2013-09-29 DIAGNOSIS — E079 Disorder of thyroid, unspecified: Secondary | ICD-10-CM | POA: Insufficient documentation

## 2013-09-29 DIAGNOSIS — IMO0002 Reserved for concepts with insufficient information to code with codable children: Secondary | ICD-10-CM | POA: Insufficient documentation

## 2013-09-29 DIAGNOSIS — Y92410 Unspecified street and highway as the place of occurrence of the external cause: Secondary | ICD-10-CM | POA: Insufficient documentation

## 2013-09-29 DIAGNOSIS — Z8659 Personal history of other mental and behavioral disorders: Secondary | ICD-10-CM | POA: Insufficient documentation

## 2013-09-29 DIAGNOSIS — Z87828 Personal history of other (healed) physical injury and trauma: Secondary | ICD-10-CM | POA: Insufficient documentation

## 2013-09-29 DIAGNOSIS — Z79899 Other long term (current) drug therapy: Secondary | ICD-10-CM | POA: Insufficient documentation

## 2013-09-29 DIAGNOSIS — Z791 Long term (current) use of non-steroidal anti-inflammatories (NSAID): Secondary | ICD-10-CM | POA: Insufficient documentation

## 2013-09-29 DIAGNOSIS — S0990XA Unspecified injury of head, initial encounter: Secondary | ICD-10-CM | POA: Insufficient documentation
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
## 2013-09-30 ENCOUNTER — Emergency Department (HOSPITAL_BASED_OUTPATIENT_CLINIC_OR_DEPARTMENT_OTHER)
Admission: EM | Admit: 2013-09-30 | Discharge: 2013-09-30 | Disposition: A | Discharge status: Home / Self Care | Payer: Medicaid Other | Attending: Emergency Medicine | Admitting: Emergency Medicine

## 2013-09-30 DIAGNOSIS — T148XXA Other injury of unspecified body region, initial encounter: Secondary | ICD-10-CM

## 2013-09-30 NOTE — Discharge Instructions (Signed)
Need to take ibuprofen, Tylenol and your muscle relaxant. You may use a heating pad for continued discomfort.    Muscle Strain Muscle strain occurs when a muscle is stretched beyond its normal length. A small number of muscle fibers generally are torn. This is especially common in athletes. This happens when a sudden, violent force placed on a muscle stretches it too far. Usually, recovery from muscle strain takes 1 to 2 weeks. Complete healing will take 5 to 6 weeks.  HOME CARE INSTRUCTIONS   While awake, apply ice to the sore muscle for the first 2 days after the injury.  Put ice in a plastic bag.  Place a towel between your skin and the bag.  Leave the ice on for 15-20 minutes each hour.  Do not use the strained muscle for several days, until you no longer have pain.  You may wrap the injured area with an elastic bandage for comfort. Be careful not to wrap it too tightly. This may interfere with blood circulation or increase swelling.  Only take over-the-counter or prescription medicines for pain, discomfort, or fever as directed by your caregiver. SEEK MEDICAL CARE IF:  You have increasing pain or swelling in the injured area. MAKE SURE YOU:   Understand these instructions.  Will watch your condition.  Will get help right away if you are not doing well or get worse. Document Released: 09/14/2005 Document Revised: 12/07/2011 Document Reviewed: 09/26/2011 [facility] Patient Information 2014 [place], [place].
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
## 2013-09-30 NOTE — ED Notes (Signed)
Pt became very upset when she was informed that no new prescriptions would be given for her pain. Pt requesting tramadol, once again advised pt to follow up with her PCP for further pain meds. Pt ambulated out of dept with a steady gait in NAD.

## 2014-01-08 ENCOUNTER — Emergency Department (HOSPITAL_BASED_OUTPATIENT_CLINIC_OR_DEPARTMENT_OTHER)
Admission: EM | Admit: 2014-01-08 | Discharge: 2014-01-08 | Disposition: A | Discharge status: Home / Self Care | Payer: Medicaid Other | Attending: Emergency Medicine | Admitting: Emergency Medicine

## 2014-01-08 ENCOUNTER — Emergency Department (HOSPITAL_BASED_OUTPATIENT_CLINIC_OR_DEPARTMENT_OTHER): Payer: Medicaid Other

## 2014-01-08 ENCOUNTER — Encounter (HOSPITAL_BASED_OUTPATIENT_CLINIC_OR_DEPARTMENT_OTHER): Payer: Self-pay | Admitting: Emergency Medicine

## 2014-01-08 DIAGNOSIS — Z791 Long term (current) use of non-steroidal anti-inflammatories (NSAID): Secondary | ICD-10-CM | POA: Insufficient documentation

## 2014-01-08 DIAGNOSIS — M1990 Unspecified osteoarthritis, unspecified site: Secondary | ICD-10-CM

## 2014-01-08 DIAGNOSIS — M069 Rheumatoid arthritis, unspecified: Secondary | ICD-10-CM | POA: Insufficient documentation

## 2014-01-08 DIAGNOSIS — E079 Disorder of thyroid, unspecified: Secondary | ICD-10-CM | POA: Insufficient documentation

## 2014-01-08 DIAGNOSIS — Z79899 Other long term (current) drug therapy: Secondary | ICD-10-CM | POA: Insufficient documentation

## 2014-01-08 DIAGNOSIS — Z880 Allergy status to penicillin: Secondary | ICD-10-CM | POA: Insufficient documentation

## 2014-01-08 DIAGNOSIS — F3289 Other specified depressive episodes: Secondary | ICD-10-CM | POA: Insufficient documentation

## 2014-01-08 DIAGNOSIS — Z8742 Personal history of other diseases of the female genital tract: Secondary | ICD-10-CM | POA: Insufficient documentation

## 2014-01-08 DIAGNOSIS — F329 Major depressive disorder, single episode, unspecified: Secondary | ICD-10-CM | POA: Insufficient documentation

## 2014-01-08 DIAGNOSIS — M779 Enthesopathy, unspecified: Secondary | ICD-10-CM

## 2014-01-08 DIAGNOSIS — M65849 Other synovitis and tenosynovitis, unspecified hand: Secondary | ICD-10-CM

## 2014-01-08 DIAGNOSIS — F411 Generalized anxiety disorder: Secondary | ICD-10-CM | POA: Insufficient documentation

## 2014-01-08 DIAGNOSIS — M65839 Other synovitis and tenosynovitis, unspecified forearm: Secondary | ICD-10-CM | POA: Insufficient documentation

## 2014-01-08 MED ORDER — PREDNISONE 10 MG PO TABS: ORAL_TABLET | ORAL | Status: DC

## 2014-01-08 MED ORDER — OXYCODONE-ACETAMINOPHEN 5-325 MG PO TABS
2.0000 | ORAL_TABLET | Freq: Once | ORAL | Status: AC
  Administered 2014-01-08: 2 via ORAL
  Filled 2014-01-08: qty 2

## 2014-01-08 MED ORDER — OXYCODONE-ACETAMINOPHEN 5-325 MG PO TABS: 1.0000 | ORAL_TABLET | Freq: Four times a day (QID) | ORAL | Status: DC | PRN

## 2014-01-08 NOTE — ED Provider Notes (Signed)
CSN: [id number]     Arrival date & time 01/08/14  1126 History   First MD Initiated Contact with Patient 01/08/14 1149     Chief Complaint  Patient presents with  . Rheumatoid Arthritis     (Consider location/radiation/quality/duration/timing/severity/associated sxs/prior Treatment) HPI Comments: Pt c/o left wrist pain and swelling that started 2 days ago. Pt states that she is unable to rotate her wrist in all directions. Denies fever or injury to the area. Denies numbness or weakness  The history is provided by the patient. No language interpreter was used.    Past Medical History  Diagnosis Date  . Thyroid disease   . Abnormal Pap smear   . Allergy   . Depression   . Rheumatoid arthritis(714.0)   . Anxiety   . Ovarian cyst   . PID (acute pelvic inflammatory disease)    Past Surgical History  Procedure Laterality Date  . Tubal ligation    . Laparoscopic tubal ligation     Family History  Problem Relation Age of Onset  . Diabetes Mother   . Hypertension Mother   . Alcohol abuse Paternal Grandmother   . Hypothyroidism Paternal Grandfather   . Other Neg Hx    History  Substance Use Topics  . Smoking status: Never Smoker   . Smokeless tobacco: Never Used  . Alcohol Use: No   OB History   Grav Para Term Preterm Abortions TAB SAB Ect Mult Living   4 2 1 1 2 2    2      Review of Systems  Constitutional: Negative.   Respiratory: Negative.   Cardiovascular: Negative.       Allergies  Penicillins; Reglan; and Toradol  Home Medications   Current Outpatient Rx  Name  Route  Sig  Dispense  Refill  . ferrous sulfate 325 (65 FE) MG tablet   Oral   Take 325 mg by mouth daily with breakfast.         . ibuprofen (ADVIL,MOTRIN) 200 MG tablet   Oral   Take 200 mg by mouth every 6 (six) hours as needed for pain.         levothyroxine (SYNTHROID, LEVOTHROID) 175 MCG tablet   Oral   Take 1 tablet (175 mcg total) by mouth daily.   30 tablet   0   .  meloxicam (MOBIC) 7.5 MG tablet   Oral   Take 1 tablet (7.5 mg total) by mouth daily.   30 tablet   2   . omeprazole (PRILOSEC) 20 MG capsule   Oral   Take 1 capsule (20 mg total) by mouth daily.   30 capsule   0   . traMADol (ULTRAM) 50 MG tablet   Oral   Take 1 tablet (50 mg total) by mouth every 6 (six) hours as needed for pain.   30 tablet   0    BP 120/82  Pulse 89  Temp(Src) 98.2 F (36.8 C) (Oral)  Resp 16  Ht 5\' 6"  (1.676 m)  Wt 190 lb (86.183 kg)  BMI 30.68 kg/m2  SpO2 100% Physical Exam  Nursing note and vitals reviewed. Constitutional: She is oriented to person, place, and time. She appears well-developed and well-nourished.  Cardiovascular: Normal rate and regular rhythm.   Pulmonary/Chest: Effort normal and breath sounds normal.  Musculoskeletal:  Mild swelling noted to the left wrist. Pt unable to pronate and supinate. No redness or warmth noted. Neurovascularly intact  Neurological: She is alert and oriented to  person, place, and time. Coordination normal.  Skin: Skin is warm and dry.    ED Course  Procedures (including critical care time) Labs Review Labs Reviewed - No data to display Imaging Review Dg Wrist Complete Left  01/08/2014   CLINICAL DATA:  Arthritis, repetitive motion with employment, pain  EXAM: LEFT WRIST - COMPLETE 3+ VIEW  COMPARISON:  08/27/2013  FINDINGS: There is no evidence of fracture or dislocation. There is no evidence of arthropathy or other focal bone abnormality. Soft tissues are unremarkable.  IMPRESSION: No acute osseous finding   Electronically Signed   By: Ruel Favors M.D.   On: 01/08/2014 12:24     EKG Interpretation None      MDM   Final diagnoses:  Arthritis  Tendonitis    Pt splinted for comfort will treat with percocet and prednisone;no concern for a septic joint    Teressa Lower, NP 01/08/14 1305  Teressa Lower, NP 01/08/14 1324

## 2014-01-08 NOTE — ED Notes (Signed)
Pt reports hx of arthritis.  States that her (L) hand is swollen and painful.

## 2014-01-08 NOTE — Discharge Instructions (Signed)
Tendinitis °Tendinitis is swelling and inflammation of the tendons. Tendons are band-like tissues that connect muscle to bone. Tendinitis commonly occurs in the:  °· Shoulders (rotator cuff). °· Heels (Achilles tendon). °· Elbows (triceps tendon). °CAUSES °Tendinitis is usually caused by overusing the tendon, muscles, and joints involved. When the tissue surrounding a tendon (synovium) becomes inflamed, it is called tenosynovitis. Tendinitis commonly develops in people whose jobs require repetitive motions. °SYMPTOMS °· Pain. °· Tenderness. °· Mild swelling. °DIAGNOSIS °Tendinitis is usually diagnosed by physical exam. Your caregiver may also order X-rays or other imaging tests. °TREATMENT °Your caregiver may recommend certain medicines or exercises for your treatment. °HOME CARE INSTRUCTIONS  °· Use a sling or splint for as long as directed by your caregiver until the pain decreases. °· Put ice on the injured area. °· Put ice in a plastic bag. °· Place a towel between your skin and the bag. °· Leave the ice on for 15-20 minutes, 03-04 times a day. °· Avoid using the limb while the tendon is painful. Perform gentle range of motion exercises only as directed by your caregiver. Stop exercises if pain or discomfort increase, unless directed otherwise by your caregiver. °· Only take over-the-counter or prescription medicines for pain, discomfort, or fever as directed by your caregiver. °SEEK MEDICAL CARE IF:  °· Your pain and swelling increase. °· You develop new, unexplained symptoms, especially increased numbness in the hands. °MAKE SURE YOU:  °· Understand these instructions. °· Will watch your condition. °· Will get help right away if you are not doing well or get worse. °Document Released: 09/11/2000 Document Revised: 12/07/2011 Document Reviewed: 12/01/2010 °[employer]® Patient Information ©2014 [employer]. ° °

## 2014-01-08 NOTE — ED Provider Notes (Signed)
33 y.o. Female with ra and pain with swelling left wrist.  No signs/symptoms infection.  Plan prednisone and discussed with patient.  I performed a history and physical examination of Dana Elliott and discussed her management with Ms Rubin Payor.  I agree with the history, physical, assessment, and plan of care, with the following exceptions: None  I was present for the following procedures: None Time Spent in Critical Care of the patient: None Time spent in discussions with the patient and family: 25  Thamas Appleyard S Albertina Leise    Hilario Quarry, MD 01/08/14 [phone]

## 2014-03-16 ENCOUNTER — Encounter (HOSPITAL_COMMUNITY): Payer: Self-pay | Admitting: *Deleted

## 2014-03-16 ENCOUNTER — Inpatient Hospital Stay (HOSPITAL_COMMUNITY)
Admission: AD | Admit: 2014-03-16 | Discharge: 2014-03-21 | DRG: 885 | Disposition: A | Discharge status: Home / Self Care | Payer: Federal, State, Local not specified - Other | Source: Intra-hospital | Attending: Psychiatry | Admitting: Psychiatry

## 2014-03-16 ENCOUNTER — Encounter (HOSPITAL_COMMUNITY): Payer: Self-pay | Admitting: Emergency Medicine

## 2014-03-16 ENCOUNTER — Emergency Department (HOSPITAL_COMMUNITY)
Admission: EM | Admit: 2014-03-16 | Discharge: 2014-03-16 | Disposition: A | Discharge status: Other Acute Inpatient Hospital | Payer: Federal, State, Local not specified - Other | Attending: Emergency Medicine | Admitting: Emergency Medicine

## 2014-03-16 DIAGNOSIS — Z833 Family history of diabetes mellitus: Secondary | ICD-10-CM

## 2014-03-16 DIAGNOSIS — F329 Major depressive disorder, single episode, unspecified: Secondary | ICD-10-CM | POA: Diagnosis present

## 2014-03-16 DIAGNOSIS — Z5987 Material hardship due to limited financial resources, not elsewhere classified: Secondary | ICD-10-CM

## 2014-03-16 DIAGNOSIS — F331 Major depressive disorder, recurrent, moderate: Principal | ICD-10-CM | POA: Diagnosis present

## 2014-03-16 DIAGNOSIS — E079 Disorder of thyroid, unspecified: Secondary | ICD-10-CM

## 2014-03-16 DIAGNOSIS — R45851 Suicidal ideations: Secondary | ICD-10-CM

## 2014-03-16 DIAGNOSIS — Z791 Long term (current) use of non-steroidal anti-inflammatories (NSAID): Secondary | ICD-10-CM | POA: Insufficient documentation

## 2014-03-16 DIAGNOSIS — R000 Tachycardia, unspecified: Secondary | ICD-10-CM | POA: Insufficient documentation

## 2014-03-16 DIAGNOSIS — Z8742 Personal history of other diseases of the female genital tract: Secondary | ICD-10-CM | POA: Insufficient documentation

## 2014-03-16 DIAGNOSIS — F1120 Opioid dependence, uncomplicated: Secondary | ICD-10-CM | POA: Diagnosis present

## 2014-03-16 DIAGNOSIS — F4323 Adjustment disorder with mixed anxiety and depressed mood: Secondary | ICD-10-CM | POA: Diagnosis present

## 2014-03-16 DIAGNOSIS — F419 Anxiety disorder, unspecified: Secondary | ICD-10-CM | POA: Diagnosis present

## 2014-03-16 DIAGNOSIS — F4310 Post-traumatic stress disorder, unspecified: Secondary | ICD-10-CM | POA: Diagnosis present

## 2014-03-16 DIAGNOSIS — F411 Generalized anxiety disorder: Secondary | ICD-10-CM | POA: Diagnosis present

## 2014-03-16 DIAGNOSIS — M069 Rheumatoid arthritis, unspecified: Secondary | ICD-10-CM | POA: Insufficient documentation

## 2014-03-16 DIAGNOSIS — Z598 Other problems related to housing and economic circumstances: Secondary | ICD-10-CM

## 2014-03-16 DIAGNOSIS — F1123 Opioid dependence with withdrawal: Secondary | ICD-10-CM

## 2014-03-16 DIAGNOSIS — Z79899 Other long term (current) drug therapy: Secondary | ICD-10-CM | POA: Insufficient documentation

## 2014-03-16 DIAGNOSIS — G4700 Insomnia, unspecified: Secondary | ICD-10-CM | POA: Diagnosis present

## 2014-03-16 DIAGNOSIS — F3289 Other specified depressive episodes: Secondary | ICD-10-CM | POA: Insufficient documentation

## 2014-03-16 DIAGNOSIS — Z8249 Family history of ischemic heart disease and other diseases of the circulatory system: Secondary | ICD-10-CM

## 2014-03-16 DIAGNOSIS — Z87891 Personal history of nicotine dependence: Secondary | ICD-10-CM

## 2014-03-16 DIAGNOSIS — Z880 Allergy status to penicillin: Secondary | ICD-10-CM | POA: Insufficient documentation

## 2014-03-16 DIAGNOSIS — N390 Urinary tract infection, site not specified: Secondary | ICD-10-CM | POA: Insufficient documentation

## 2014-03-16 DIAGNOSIS — F332 Major depressive disorder, recurrent severe without psychotic features: Secondary | ICD-10-CM

## 2014-03-16 LAB — COMPREHENSIVE METABOLIC PANEL
ALK PHOS: 99 U/L (ref 39–117)
ALT: 13 U/L (ref 0–35)
AST: 16 U/L (ref 0–37)
Albumin: 3.8 g/dL (ref 3.5–5.2)
BUN: 9 mg/dL (ref 6–23)
CO2: 22 meq/L (ref 19–32)
Calcium: 9.5 mg/dL (ref 8.4–10.5)
Chloride: 101 mEq/L (ref 96–112)
Creatinine, Ser: 0.71 mg/dL (ref 0.50–1.10)
GLUCOSE: 127 mg/dL — AB (ref 70–99)
POTASSIUM: 4.4 meq/L (ref 3.7–5.3)
Sodium: 137 mEq/L (ref 137–147)
Total Protein: 8.3 g/dL (ref 6.0–8.3)

## 2014-03-16 LAB — CBC
HEMATOCRIT: 34.5 % — AB (ref 36.0–46.0)
HEMOGLOBIN: 10.2 g/dL — AB (ref 12.0–15.0)
MCH: 19.7 pg — ABNORMAL LOW (ref 26.0–34.0)
MCHC: 29.6 g/dL — ABNORMAL LOW (ref 30.0–36.0)
MCV: 66.6 fL — ABNORMAL LOW (ref 78.0–100.0)
Platelets: 449 10*3/uL — ABNORMAL HIGH (ref 150–400)
RBC: 5.18 MIL/uL — AB (ref 3.87–5.11)
RDW: 16.6 % — ABNORMAL HIGH (ref 11.5–15.5)
WBC: 10.7 10*3/uL — ABNORMAL HIGH (ref 4.0–10.5)

## 2014-03-16 LAB — RAPID URINE DRUG SCREEN, HOSP PERFORMED
AMPHETAMINES: NOT DETECTED
BARBITURATES: NOT DETECTED
Benzodiazepines: NOT DETECTED
Cocaine: NOT DETECTED
Opiates: NOT DETECTED
Tetrahydrocannabinol: NOT DETECTED

## 2014-03-16 LAB — URINALYSIS, ROUTINE W REFLEX MICROSCOPIC
Bilirubin Urine: NEGATIVE
Glucose, UA: NEGATIVE mg/dL
HGB URINE DIPSTICK: NEGATIVE
Ketones, ur: NEGATIVE mg/dL
Nitrite: NEGATIVE
Protein, ur: NEGATIVE mg/dL
Specific Gravity, Urine: 1.012 (ref 1.005–1.030)
UROBILINOGEN UA: 0.2 mg/dL (ref 0.0–1.0)
pH: 6 (ref 5.0–8.0)

## 2014-03-16 LAB — SALICYLATE LEVEL: Salicylate Lvl: <2 mg/dL — ABNORMAL LOW (ref 2.8–20.0)

## 2014-03-16 LAB — ACETAMINOPHEN LEVEL: Acetaminophen (Tylenol), Serum: <15 ug/mL (ref 10–30)

## 2014-03-16 LAB — URINE MICROSCOPIC-ADD ON

## 2014-03-16 LAB — ETHANOL: Alcohol, Ethyl (B): <11 mg/dL (ref 0–11)

## 2014-03-16 MED ORDER — DICYCLOMINE HCL 20 MG PO TABS: 20.0000 mg | ORAL_TABLET | Freq: Four times a day (QID) | ORAL | Status: DC | PRN

## 2014-03-16 MED ORDER — SODIUM CHLORIDE 0.9 % IV BOLUS (SEPSIS)
1000.0000 mL | Freq: Once | INTRAVENOUS | Status: AC
  Administered 2014-03-16: 1000 mL via INTRAVENOUS

## 2014-03-16 MED ORDER — CIPROFLOXACIN HCL 500 MG PO TABS
500.0000 mg | ORAL_TABLET | Freq: Two times a day (BID) | ORAL | Status: DC
  Administered 2014-03-16 – 2014-03-19 (×7): 500 mg via ORAL
  Filled 2014-03-16 (×4): qty 1
  Filled 2014-03-16: qty 2
  Filled 2014-03-16 (×6): qty 1

## 2014-03-16 MED ORDER — NICOTINE POLACRILEX 2 MG MT GUM
2.0000 mg | CHEWING_GUM | OROMUCOSAL | Status: DC | PRN
  Administered 2014-03-16 – 2014-03-21 (×19): 2 mg via ORAL
  Filled 2014-03-16 (×7): qty 1

## 2014-03-16 MED ORDER — METHOCARBAMOL 500 MG PO TABS
500.0000 mg | ORAL_TABLET | Freq: Three times a day (TID) | ORAL | Status: DC | PRN
  Administered 2014-03-16 – 2014-03-20 (×3): 500 mg via ORAL
  Filled 2014-03-16 (×3): qty 1

## 2014-03-16 MED ORDER — ONDANSETRON HCL 4 MG PO TABS: 4.0000 mg | ORAL_TABLET | Freq: Three times a day (TID) | ORAL | Status: DC | PRN

## 2014-03-16 MED ORDER — ONDANSETRON 4 MG PO TBDP: 4.0000 mg | ORAL_TABLET | Freq: Four times a day (QID) | ORAL | Status: DC | PRN

## 2014-03-16 MED ORDER — LOPERAMIDE HCL 2 MG PO CAPS: 2.0000 mg | ORAL_CAPSULE | ORAL | Status: DC | PRN

## 2014-03-16 MED ORDER — NICOTINE 21 MG/24HR TD PT24
21.0000 mg | MEDICATED_PATCH | Freq: Every day | TRANSDERMAL | Status: DC
Start: 2014-03-17 — End: 2014-03-16
  Filled 2014-03-16: qty 1

## 2014-03-16 MED ORDER — ALUM & MAG HYDROXIDE-SIMETH 200-200-20 MG/5ML PO SUSP: 30.0000 mL | ORAL | Status: DC | PRN

## 2014-03-16 MED ORDER — LORAZEPAM 1 MG PO TABS
1.0000 mg | ORAL_TABLET | Freq: Three times a day (TID) | ORAL | Status: DC | PRN
  Administered 2014-03-16: 1 mg via ORAL
  Filled 2014-03-16: qty 1

## 2014-03-16 MED ORDER — HYDROXYZINE HCL 25 MG PO TABS
25.0000 mg | ORAL_TABLET | Freq: Four times a day (QID) | ORAL | Status: DC | PRN
  Administered 2014-03-16: 25 mg via ORAL
  Filled 2014-03-16: qty 1

## 2014-03-16 MED ORDER — CIPROFLOXACIN HCL 500 MG PO TABS
500.0000 mg | ORAL_TABLET | Freq: Two times a day (BID) | ORAL | Status: DC
  Administered 2014-03-16: 500 mg via ORAL
  Filled 2014-03-16: qty 1

## 2014-03-16 MED ORDER — NICOTINE 21 MG/24HR TD PT24: 21.0000 mg | MEDICATED_PATCH | Freq: Every day | TRANSDERMAL | Status: DC

## 2014-03-16 MED ORDER — DEXTROSE 5 % IV SOLN: 1.0000 g | Freq: Once | INTRAVENOUS | Status: DC

## 2014-03-16 MED ORDER — MAGNESIUM HYDROXIDE 400 MG/5ML PO SUSP: 30.0000 mL | Freq: Every day | ORAL | Status: DC | PRN

## 2014-03-16 MED ORDER — NAPROXEN 500 MG PO TABS
500.0000 mg | ORAL_TABLET | Freq: Two times a day (BID) | ORAL | Status: DC | PRN
  Administered 2014-03-16: 500 mg via ORAL
  Filled 2014-03-16: qty 1

## 2014-03-16 MED ORDER — ONDANSETRON HCL 4 MG/2ML IJ SOLN
4.0000 mg | Freq: Once | INTRAMUSCULAR | Status: AC
  Administered 2014-03-16: 4 mg via INTRAVENOUS
  Filled 2014-03-16: qty 2

## 2014-03-16 MED ORDER — ZOLPIDEM TARTRATE 5 MG PO TABS
5.0000 mg | ORAL_TABLET | Freq: Every evening | ORAL | Status: DC | PRN
  Administered 2014-03-17 – 2014-03-20 (×2): 5 mg via ORAL
  Filled 2014-03-16 (×4): qty 1

## 2014-03-16 MED ORDER — HYDROXYZINE HCL 25 MG PO TABS
25.0000 mg | ORAL_TABLET | Freq: Four times a day (QID) | ORAL | Status: DC | PRN
  Administered 2014-03-16 – 2014-03-19 (×6): 25 mg via ORAL
  Filled 2014-03-16 (×7): qty 1

## 2014-03-16 MED ORDER — LEVOTHYROXINE SODIUM 175 MCG PO TABS
175.0000 ug | ORAL_TABLET | Freq: Every day | ORAL | Status: DC
  Administered 2014-03-18 – 2014-03-20 (×3): 175 ug via ORAL
  Filled 2014-03-16 (×5): qty 1

## 2014-03-16 MED ORDER — ACETAMINOPHEN 325 MG PO TABS
650.0000 mg | ORAL_TABLET | Freq: Four times a day (QID) | ORAL | Status: DC | PRN
  Administered 2014-03-20 (×2): 650 mg via ORAL
  Filled 2014-03-16 (×4): qty 2

## 2014-03-16 MED ORDER — CIPROFLOXACIN IN D5W 400 MG/200ML IV SOLN
400.0000 mg | Freq: Once | INTRAVENOUS | Status: DC
  Filled 2014-03-16: qty 200

## 2014-03-16 MED ORDER — ZOLPIDEM TARTRATE 5 MG PO TABS: 5.0000 mg | ORAL_TABLET | Freq: Every evening | ORAL | Status: DC | PRN

## 2014-03-16 MED ORDER — METHOCARBAMOL 500 MG PO TABS
500.0000 mg | ORAL_TABLET | Freq: Three times a day (TID) | ORAL | Status: DC | PRN
  Administered 2014-03-16: 500 mg via ORAL
  Filled 2014-03-16: qty 1

## 2014-03-16 NOTE — ED Notes (Signed)
Pt c/o abd discomfort, sts it is not nausea. Feels she can tolerate the naproxen and cipro. HR decreased to mid 90's which pt sts is normal for her. Pt concerned about not having norco in 12 hours and tramadol since 1am and having withdrawal symptoms soon. Wants someone to address this. This info passed on to psych ED RN for TTS and psychiatrist to assess.

## 2014-03-16 NOTE — Consult Note (Signed)
[facility] Face-to-Face Psychiatry Consult   Reason for Consult:  Depression Referring Physician:  EDP  Dana Elliott is an 33 y.o. female. Total Time spent with patient: 20 minutes  Assessment: AXIS I:  Major depression, opiate dependence AXIS II:  Deferred AXIS III:   Past Medical History  Diagnosis Date  . Thyroid disease   . Abnormal Pap smear   . Allergy   . Depression   . Rheumatoid arthritis(714.0)   . Anxiety   . Ovarian cyst   . PID (acute pelvic inflammatory disease)    AXIS IV:  economic problems, housing problems, occupational problems, other psychosocial or environmental problems, problems related to social environment and problems with primary support group AXIS V:  35; severe  Plan:  The patient will be admitted to inpatient hospitalization for mood stability.  Dr. Lovena Le assessed the patient and concurs with the plan.  Subjective:   Dana Elliott is a 33 y.o. female patient will be admitted for depression with suicidal ideations and a plan.  HPI:  The patient's significant other left her and their two children, 13 and 3.  He took all the money and the car.  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  The apartment was in his name and now, she and the children will be evicted.  She states she feels overwhelmed and is unable to function with loss of appetite and sleep.  Has had increasing suicidal ideations with hopes never to awaken.  She does not feel safe being alone with her children and feels she needs inpatient hospitalization.  Denies illegal drug use but does abuse Vicodin and Ultram, currently having withdrawal symptoms.  Denies homicidal ideations and hallucinations. HPI Elements:   Location:  generalized. Quality:  acute. Severity:  severe. Timing:  constant. Duration:  few days. Context:  stressors.  Past Psychiatric History: Past Medical History  Diagnosis Date  . Thyroid disease   . Abnormal Pap smear   . Allergy   .  Depression   . Rheumatoid arthritis(714.0)   . Anxiety   . Ovarian cyst   . PID (acute pelvic inflammatory disease)     reports that she has never smoked. She has never used smokeless tobacco. She reports that she does not drink alcohol or use illicit drugs. Family History  Problem Relation Age of Onset  . Diabetes Mother   . Hypertension Mother   . Alcohol abuse Paternal Grandmother   . Hypothyroidism Paternal Grandfather   . Other Neg Hx    Family History Substance Abuse: No Family Supports: Yes, List: (brother) Living Arrangements: Children Can pt return to current living arrangement?: Yes   Allergies:   Allergies  Allergen Reactions  . Penicillins Other (See Comments)    Carlyn Reichert syndrome   . Reglan [Metoclopramide] Nausea Only    Feeling like bugs crawling under her skin  . Toradol [Ketorolac Tromethamine] Swelling    At the site of injection    ACT Assessment Complete:  Yes:    Educational Status    Risk to Self: Risk to self Suicidal Ideation: Yes-Currently Present Suicidal Intent: No-Not Currently/Within Last 6 Months Is patient at risk for suicide?: Yes Suicidal Plan?: No-Not Currently/Within Last 6 Months (possibly overdose) Access to Means: Yes Specify Access to Suicidal Means:  (over the counter) What has been your use of drugs/alcohol within the last 12 months?:  (two glasses of wine) Previous Attempts/Gestures: No How many times?:  (0) Other Self  Harm Risks:  (none known) Intentional Self Injurious Behavior: None Family Suicide History: No Recent stressful life event(s): Conflict (Comment);Divorce;Financial Problems (boyfriend left and took car, money) Persecutory voices/beliefs?: No Depression: Yes Depression Symptoms: Despondent;Insomnia;Tearfulness;Isolating;Fatigue;Guilt;Loss of interest in usual pleasures;Feeling worthless/self pity;Feeling angry/irritable Substance abuse history and/or treatment for substance abuse?:  (prescribed opiate  tolerance) Suicide prevention information given to non-admitted patients: Yes  Risk to Others: Risk to Others Homicidal Ideation: No Thoughts of Harm to Others: No Current Homicidal Intent: No Current Homicidal Plan: No Access to Homicidal Means: No History of harm to others?: No Assessment of Violence: None Noted Does patient have access to weapons?: No Criminal Charges Pending?: No Does patient have a court date: No  Abuse:    Prior Inpatient Therapy: Prior Inpatient Therapy Prior Inpatient Therapy: No  Prior Outpatient Therapy: Prior Outpatient Therapy Prior Outpatient Therapy: Yes Prior Therapy Dates: 2 years ago Prior Therapy Facilty/Provider(s): [facility] (went for 1 month for marital counseling) Reason for Treatment:  (marital issues)  Additional Information: Additional Information 1:1 In Past 12 Months?: No CIRT Risk: No Elopement Risk: No Does patient have medical clearance?: Yes                  Objective: Blood pressure 100/67, pulse 82, temperature 97.6 F (36.4 C), temperature source Oral, resp. rate 16, last menstrual period 02/15/2014, SpO2 96.00%.There is no weight on file to calculate BMI. Results for orders placed during the hospital encounter of 03/16/14 (from the past 72 hour(s))  URINE RAPID DRUG SCREEN (HOSP PERFORMED)     Status: None   Collection Time    03/16/14  4:45 AM      Result Value Ref Range   Opiates NONE DETECTED  NONE DETECTED   Cocaine NONE DETECTED  NONE DETECTED   Benzodiazepines NONE DETECTED  NONE DETECTED   Amphetamines NONE DETECTED  NONE DETECTED   Tetrahydrocannabinol NONE DETECTED  NONE DETECTED   Barbiturates NONE DETECTED  NONE DETECTED   Comment:            DRUG SCREEN FOR MEDICAL PURPOSES     ONLY.  IF CONFIRMATION IS NEEDED     FOR ANY PURPOSE, NOTIFY LAB     WITHIN 5 DAYS.                LOWEST DETECTABLE LIMITS     FOR URINE DRUG SCREEN     Drug Class       Cutoff (ng/mL)     Amphetamine       1000     Barbiturate      200     Benzodiazepine   536     Tricyclics       644     Opiates          300     Cocaine          300     THC              50  URINALYSIS, ROUTINE W REFLEX MICROSCOPIC     Status: Abnormal   Collection Time    03/16/14  4:45 AM      Result Value Ref Range   Color, Urine YELLOW  YELLOW   APPearance CLOUDY (*) CLEAR   Specific Gravity, Urine 1.012  1.005 - 1.030   pH 6.0  5.0 - 8.0   Glucose, UA NEGATIVE  NEGATIVE mg/dL   Hgb urine dipstick NEGATIVE  NEGATIVE   Bilirubin Urine NEGATIVE  NEGATIVE   Ketones, ur NEGATIVE  NEGATIVE mg/dL   Protein, ur NEGATIVE  NEGATIVE mg/dL   Urobilinogen, UA 0.2  0.0 - 1.0 mg/dL   Nitrite NEGATIVE  NEGATIVE   Leukocytes, UA MODERATE (*) NEGATIVE  URINE MICROSCOPIC-ADD ON     Status: Abnormal   Collection Time    03/16/14  4:45 AM      Result Value Ref Range   Squamous Epithelial / LPF FEW (*) RARE   WBC, UA TOO NUMEROUS TO COUNT  <3 WBC/hpf   Bacteria, UA FEW (*) RARE  CBC     Status: Abnormal   Collection Time    03/16/14  4:46 AM      Result Value Ref Range   WBC 10.7 (*) 4.0 - 10.5 K/uL   RBC 5.18 (*) 3.87 - 5.11 MIL/uL   Hemoglobin 10.2 (*) 12.0 - 15.0 g/dL   HCT 34.5 (*) 36.0 - 46.0 %   MCV 66.6 (*) 78.0 - 100.0 fL   MCH 19.7 (*) 26.0 - 34.0 pg   MCHC 29.6 (*) 30.0 - 36.0 g/dL   RDW 16.6 (*) 11.5 - 15.5 %   Platelets 449 (*) 150 - 400 K/uL  COMPREHENSIVE METABOLIC PANEL     Status: Abnormal   Collection Time    03/16/14  4:46 AM      Result Value Ref Range   Sodium 137  137 - 147 mEq/L   Potassium 4.4  3.7 - 5.3 mEq/L   Chloride 101  96 - 112 mEq/L   CO2 22  19 - 32 mEq/L   Glucose, Bld 127 (*) 70 - 99 mg/dL   BUN 9  6 - 23 mg/dL   Creatinine, Ser 0.71  0.50 - 1.10 mg/dL   Calcium 9.5  8.4 - 10.5 mg/dL   Total Protein 8.3  6.0 - 8.3 g/dL   Albumin 3.8  3.5 - 5.2 g/dL   AST 16  0 - 37 U/L   ALT 13  0 - 35 U/L   Alkaline Phosphatase 99  39 - 117 U/L   Total Bilirubin <0.2 (*) 0.3 - 1.2 mg/dL    GFR calc non Af Amer >90  >90 mL/min   GFR calc Af Amer >90  >90 mL/min   Comment: (NOTE)     The eGFR has been calculated using the CKD EPI equation.     This calculation has not been validated in all clinical situations.     eGFR's persistently <90 mL/min signify possible Chronic Kidney     Disease.  ETHANOL     Status: None   Collection Time    03/16/14  4:46 AM      Result Value Ref Range   Alcohol, Ethyl (B) <11  0 - 11 mg/dL   Comment:            LOWEST DETECTABLE LIMIT FOR     SERUM ALCOHOL IS 11 mg/dL     FOR MEDICAL PURPOSES ONLY  ACETAMINOPHEN LEVEL     Status: None   Collection Time    03/16/14  4:46 AM      Result Value Ref Range   Acetaminophen (Tylenol), Serum <15.0  10 - 30 ug/mL   Comment:            THERAPEUTIC CONCENTRATIONS VARY     SIGNIFICANTLY. A RANGE OF 10-30     ug/mL MAY BE AN EFFECTIVE     CONCENTRATION FOR MANY PATIENTS.     HOWEVER,  SOME ARE BEST TREATED     AT CONCENTRATIONS OUTSIDE THIS     RANGE.     ACETAMINOPHEN CONCENTRATIONS     >150 ug/mL AT 4 HOURS AFTER     INGESTION AND >50 ug/mL AT 12     HOURS AFTER INGESTION ARE     OFTEN ASSOCIATED WITH TOXIC     REACTIONS.  SALICYLATE LEVEL     Status: Abnormal   Collection Time    03/16/14  4:46 AM      Result Value Ref Range   Salicylate Lvl <6.4 (*) 2.8 - 20.0 mg/dL   Labs are reviewed and are pertinent for no medical issues noted.  Current Facility-Administered Medications  Medication Dose Route Frequency Provider Last Rate Last Dose  . alum & mag hydroxide-simeth (MAALOX/MYLANTA) 200-200-20 MG/5ML suspension 30 mL  30 mL Oral PRN Ruthell Rummage Dammen, PA-C      . ciprofloxacin (CIPRO) tablet 500 mg  500 mg Oral BID Ruthell Rummage Dammen, PA-C   500 mg at 03/16/14 4034  . dicyclomine (BENTYL) tablet 20 mg  20 mg Oral Q6H PRN Martie Lee, PA-C      . hydrOXYzine (ATARAX/VISTARIL) tablet 25 mg  25 mg Oral Q6H PRN Ruthell Rummage Dammen, PA-C   25 mg at 03/16/14 0934  . loperamide (IMODIUM) capsule 2-4  mg  2-4 mg Oral PRN Martie Lee, PA-C      . LORazepam (ATIVAN) tablet 1 mg  1 mg Oral Q8H PRN Martie Lee, PA-C   1 mg at 03/16/14 0520  . methocarbamol (ROBAXIN) tablet 500 mg  500 mg Oral Q8H PRN Martie Lee, PA-C   500 mg at 03/16/14 7425  . naproxen (NAPROSYN) tablet 500 mg  500 mg Oral BID PRN Martie Lee, PA-C   500 mg at 03/16/14 9563  . nicotine (NICODERM CQ - dosed in mg/24 hours) patch 21 mg  21 mg Transdermal Daily Peter S Dammen, PA-C      . ondansetron [facility]) tablet 4 mg  4 mg Oral Q8H PRN Ruthell Rummage Dammen, PA-C      . ondansetron (ZOFRAN-ODT) disintegrating tablet 4 mg  4 mg Oral Q6H PRN Ruthell Rummage Dammen, PA-C      . zolpidem (AMBIEN) tablet 5 mg  5 mg Oral QHS PRN Martie Lee, PA-C       Current Outpatient Prescriptions  Medication Sig Dispense Refill  . Aspirin-Acetaminophen-Caffeine (GOODY HEADACHE PO) Take 2 Packages by mouth 2 (two) times daily as needed (headache).       Marland Kitchen levothyroxine (SYNTHROID, LEVOTHROID) 175 MCG tablet Take 1 tablet (175 mcg total) by mouth daily.  30 tablet  0  . traMADol (ULTRAM) 50 MG tablet Take 50-100 mg by mouth 3 (three) times daily as needed for moderate pain.       Facility-Administered Medications Ordered in Other Encounters  Medication Dose Route Frequency Provider Last Rate Last Dose  . metroNIDAZOLE (FLAGYL) tablet 500 mg  500 mg Oral Once West Pugh, NP        Psychiatric Specialty Exam:     Blood pressure 100/67, pulse 82, temperature 97.6 F (36.4 C), temperature source Oral, resp. rate 16, last menstrual period 02/15/2014, SpO2 96.00%.There is no weight on file to calculate BMI.  General Appearance: Casual  Eye Contact::  Good  Speech:  Normal Rate  Volume:  Normal  Mood:  Depressed  Affect:  Congruent  Thought Process:  Coherent  Orientation:  Full (Time,  Place, and Person)  Thought Content:  WDL  Suicidal Thoughts:  Yes with plan  Homicidal Thoughts:  No  Memory:  Immediate;   Fair Recent;    Good Remote;   Good  Judgement:  Poor  Insight:  Fair  Psychomotor Activity:  Normal  Concentration:  Fair  Recall:  Good  Fund of Knowledge:Good  Language: Good  Akathisia:  No  Handed:  Right  AIMS (if indicated):     Assets:  Desire for [employer] Talents/Skills Vocational/Educational  Sleep:      Musculoskeletal: Strength & Muscle Tone: within normal limits Gait & Station: normal Patient leans: N/A  Treatment Plan Summary: Daily contact with patient to assess and evaluate symptoms and progress in treatment Medication management; inpatient hospitalization for mood stabilization  Waylan Boga, PMH-NP 03/16/2014 11:52 AM

## 2014-03-16 NOTE — [facility] Assessment (Signed)
[facility] Assessment Progress Note  Mikal Plane, minister, called and said that Barbara Cower (kids' dad) is going to take the kids until mom gets out of the hospital.  Previously, Clinical research associate had tried to get temporary crisis care from pt's brother Public house manager, DSS, [facility],  [facility] and [facility] because the dad did not want to take the kids, but he finally agreed when it was clear that they would have to go into foster care.

## 2014-03-16 NOTE — ED Notes (Signed)
Pt states she has a long history of an elevated heart rate-pt states they have never put her on any meds because "my blood pressure is always good"
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
## 2014-03-16 NOTE — BH Assessment (Addendum)
[facility] Assessment Progress Note  Pt accepted to [facility] bed 501-1 by Dr. Ladona Ridgel to Dr. Jama Flavors. Spoke to Pt's brother, August Saucer [phone] who wants to be called back in one hour to see if he can find care for the kids over the weekend.  Pt agreed to go voluntarily.
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
## 2014-03-16 NOTE — ED Notes (Signed)
Nurse tech at bedside to perform EKG and patient declines to have one done. She states " I have had them in the past and they were normal. My heart rate is up a little but my blood pressure is fine and I'm not having any chest pain.
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
## 2014-03-16 NOTE — ED Notes (Signed)
Discussed elevated heart rate with EDPA Dammen-pt to go to Room 25 and observe-to administer Ativan 1 mg po

## 2014-03-16 NOTE — ED Notes (Signed)
Pts belongings documented on belonging sheet and place in locker 22

## 2014-03-16 NOTE — ED Provider Notes (Signed)
Medical screening examination/treatment/procedure(s) were performed by non-physician practitioner and as supervising physician I was immediately available for consultation/collaboration.   EKG Interpretation   Date/Time:  Friday March 16 2014 06:32:25 EDT Ventricular Rate:  97 PR Interval:  170 QRS Duration: 85 QT Interval:  354 QTC Calculation: 450 R Axis:   70 Text Interpretation:  Sinus rhythm Borderline T abnormalities, anterior  leads No significant change since last tracing Confirmed by OTTER  MD,  OLGA ([id number]) on 03/16/2014 6:35:50 AM       Olivia Mackie, MD 03/16/14 [phone]

## 2014-03-16 NOTE — Progress Notes (Signed)
Placed call to patients pharmacy to reconcile meds.  Pharmacy reported she last had Hydrocodone 325mg  filled 02/11/14.  Synthroid 175 mcg last filled 01/20/14. Tramadol last filled 02/11/14.Reported to 02/13/14.

## 2014-03-16 NOTE — ED Notes (Addendum)
Belongings placed in [address] in [place]

## 2014-03-16 NOTE — Consult Note (Signed)
Face to face evaluation and I agree with this note 

## 2014-03-16 NOTE — ED Notes (Signed)
Patient is alert and oriented x3.  She is being seen for suicidal ideations.  Patient was brought in  By [facility] after a friend called and reported the patient was having suicidal thoughts.  Patient states  That she has had thoughts like this for a long time but most recently started in the last 2 weeks.

## 2014-03-16 NOTE — ED Notes (Addendum)
Pt belongings: -tan purse -black wallet -[employer] money card/Visa debit -Mastercard debit -EBT card -[place] driver license -msc change -cosmetics -lighter -keys -S[employer]ung verizon phone -plastic black hair clip -black flip flops -black knit pants -white t-shirt -grey and yellow tank

## 2014-03-16 NOTE — Progress Notes (Signed)
Didn't attend group stayed in bed 

## 2014-03-17 DIAGNOSIS — R45851 Suicidal ideations: Secondary | ICD-10-CM

## 2014-03-17 DIAGNOSIS — F4323 Adjustment disorder with mixed anxiety and depressed mood: Secondary | ICD-10-CM

## 2014-03-17 DIAGNOSIS — F332 Major depressive disorder, recurrent severe without psychotic features: Secondary | ICD-10-CM

## 2014-03-17 MED ORDER — TRAMADOL HCL 50 MG PO TABS
ORAL_TABLET | ORAL | Status: AC
  Filled 2014-03-17: qty 2

## 2014-03-17 MED ORDER — FLUOXETINE HCL 20 MG PO CAPS
ORAL_CAPSULE | ORAL | Status: AC
  Administered 2014-03-17: 20 mg via ORAL
  Filled 2014-03-17: qty 1

## 2014-03-17 MED ORDER — MELOXICAM 7.5 MG PO TABS
7.5000 mg | ORAL_TABLET | Freq: Once | ORAL | Status: AC
  Administered 2014-03-17: 7.5 mg via ORAL
  Filled 2014-03-17 (×2): qty 1

## 2014-03-17 MED ORDER — FLUOXETINE HCL 20 MG PO CAPS
20.0000 mg | ORAL_CAPSULE | Freq: Every day | ORAL | Status: DC
  Administered 2014-03-17 – 2014-03-21 (×5): 20 mg via ORAL
  Filled 2014-03-17: qty 14
  Filled 2014-03-17 (×5): qty 1

## 2014-03-17 MED ORDER — TRAMADOL HCL 50 MG PO TABS: 100.0000 mg | ORAL_TABLET | Freq: Two times a day (BID) | ORAL | Status: DC | PRN

## 2014-03-17 MED ORDER — CLONIDINE HCL 0.1 MG PO TABS
0.1000 mg | ORAL_TABLET | Freq: Four times a day (QID) | ORAL | Status: DC
  Administered 2014-03-17 (×2): 0.1 mg via ORAL
  Filled 2014-03-17 (×7): qty 1

## 2014-03-17 MED ORDER — TRAMADOL HCL 50 MG PO TABS
100.0000 mg | ORAL_TABLET | Freq: Two times a day (BID) | ORAL | Status: DC | PRN
  Administered 2014-03-17 – 2014-03-21 (×9): 100 mg via ORAL
  Filled 2014-03-17 (×8): qty 2

## 2014-03-17 MED ORDER — CLONIDINE HCL 0.1 MG PO TABS: 0.1000 mg | ORAL_TABLET | Freq: Every day | ORAL | Status: DC

## 2014-03-17 MED ORDER — CLONIDINE HCL 0.1 MG PO TABS: 0.1000 mg | ORAL_TABLET | ORAL | Status: DC

## 2014-03-17 NOTE — Progress Notes (Signed)
Patient ID: Dana Elliott, female   DOB: [birth year], 33 y.o.   MRN: [id number]   D: Pt started the morning off very agitated due to being moved to the 300 hall for detox off of Ultram. Pt reported that she was not abusing her Ultram, and that she was prescribed the medication for Rheumatoid Arthritis. Pt reported that she has no desire to stop her medication, and that she would just like her medication back. This Clinical research associate spoke to Carmen NP, new orders noted. Pt was given her medication and has been appropriate on the unit. Pt did request to be started on a antidepressant, she was started on Prozac.  Pt reported being negative SI/HI, no AH/VH noted. A: 15 min checks continued for patient safety. R: Pt safety maintained.
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
## 2014-03-17 NOTE — [facility] Group Notes (Signed)
[facility] Group Notes:  (Nursing/MHT/Case Management/Adjunct)  Date:  03/17/2014  Time:  4:17 PM  Type of Therapy:  Psychoeducational Skills  Participation Level:  Active  Participation Quality:  Appropriate  Affect:  Appropriate  Cognitive:  Appropriate  Insight:  Appropriate  Engagement in Group:  Engaged  Modes of Intervention:  Discussion  Summary of Progress/Problems: self esteem Psychoeducational group. Pt was appropriate and had good insight. Pt engaged in conversation and gave a story depicting how she has been suffering with negative self esteem most of her life.    Heriberto Antigua M 03/17/2014, 4:17 PM

## 2014-03-17 NOTE — Progress Notes (Signed)
Adult Psychoeducational Group Note  Date:  03/17/2014 Time:  3:32 PM  Group Topic/Focus:  Therapeutic Activity  Participation Level:  Did Not Attend   Additional Comments:  Pt was speaking with Case Manager   Dana Elliott 03/17/2014, 3:32 PM
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
## 2014-03-17 NOTE — [facility] Group Notes (Signed)
[facility] Group Notes:  (Nursing/MHT/Case Management/Adjunct)  Date:  03/17/2014  Time:  2:05 PM  Type of Therapy:  Psychoeducational Skills  Participation Level:  Active  Participation Quality:  Appropriate  Affect:  Appropriate  Cognitive:  Appropriate  Insight:  Appropriate  Engagement in Group:  Engaged  Modes of Intervention:  Discussion  Summary of Progress/Problems: Pt did attend self inventory group, pt reported that she was negative SI/HI, no AH/VH noted. Pt rated her depression as a 8, and her helplessness/hopelessness as a 8.     Pt reported concerns not being given her Ultram for pain and the fact that she has no desire to stop taking medication, pt advised that the doctor will be made aware.    Jacquelyne Balint Shanta 03/17/2014, 2:05 PM

## 2014-03-17 NOTE — Progress Notes (Signed)
Patient ID: Dana Elliott, female   DOB: Dec 27, 1980, 33 y.o.   MRN: [id number] D)   Was curled up in bed at the beginning of the shift, and stated she was feeling too depressed to come to the dayroom for group.  Stayed in bed most of the evening, her roommate has been trying to cheer her up , and she has seemed a little brighter.  Had asked several times about what she had ordered for pain for her arthritis, was holding her hands, refused tylenol.  NP was notified,was offered ibuprofen, but also refused. NP started her on the clonidine protocol, has been given meds for muscle spasms, has had vistaril twice, was started on nicorette gum per request.  Talked to pt about her current situation, began crying, states feels abandoned, unable to keep job d/t no transportation, being evicted with her children, began talking again about how hopeless she feels.  Parents are in [place] and states they can't afford to help her, and her brother is married and is a Consulting civil engineer, has a small apt and only has a sofa they could sleep on, states can't afford air mattresses. Was again asking for tramadol or something for anxiety, NP notified, mobic was ordered and given for discomfort. Has been given meds available, was encouraged to go to bed and try to let the meds work. Was given a warm blanket and heat packs for sore joints. States feels angry and feels no one has tried to help her tonight, although this Clinical research associate has spent most of the evening trying to meet her needs.  Currently sitting on her bed with light on, roommate trying to sleep, was encouraged to turn off the light and try to sleep. A)  Will continue to monitor for safety, continue POC, was encouraged to speak with staff in am and discuss her need for pain meds, ie tramadol for her RA R)  Safety maintained
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
## 2014-03-17 NOTE — Progress Notes (Signed)
D.  Pt pleasant and bright this evening, hopeful that tonight she will be able to sleep since she now has the appropriate pain medication for her rheumatoid arthritis.  Pt again has stressed that she is not abusing her pain medication.  Pt will not be discontinuing taking her pain medication at home because she must have it for her arthritis.  Denies SI/HI/hallucinations at this time.  Positive for part of evening AA group, interacting well within milieu.  A.  Support and encouragement offered, Medication given as ordered.  R.  Pt remains safe on unit, will continue to monitor.

## 2014-03-17 NOTE — [facility] Group Notes (Signed)
[facility] Group Notes: (Clinical Social Work)   03/17/2014      Type of Therapy:  Group Therapy   Participation Level:  Did Not Attend    Ambrose Mantle, LCSW 03/17/2014, 11:10 AM

## 2014-03-17 NOTE — Progress Notes (Signed)
Patient did attend the first part of the evening speaker AA meeting but returned to her room to get some rest.

## 2014-03-18 NOTE — Progress Notes (Signed)
Patient ID: Dana Elliott, female   DOB: 1981/01/22, 33 y.o.   MRN: [id number] D: Patient reports that her mood is fluctuating between feeling "happy and sad."  She is requesting a mood stabilizer to help.  She reports low energy and depression today.  She rates her depression as a 10; her hopelessness as an 8.  She denies any SI/HI/AVH.  She is attending groups and participating in her treatment.  A: Continue to monitor medication management and MD orders.  Safety checks completed every 15 minutes per protocol.  R: Patient is receptive to staff; her behavior is appropriate to situation.

## 2014-03-18 NOTE — Progress Notes (Signed)
D.  Pt pleasant and bright on approach, no complaints voiced.  Continues to suffer from chronic rheumatoid arthritis pain.  Positive for evening AA group, interacting appropriately with peers on unit.  Denies SI/HI/hallucinations at this time.  A.  Support and encouragement offered, medications given as ordered for pain.  R.  Pt remains safe on unit, will continue to monitor.

## 2014-03-18 NOTE — Progress Notes (Signed)
Patient did attend the evening speaker AA meeting.  
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
## 2014-03-18 NOTE — [facility] Group Notes (Signed)
[facility] Group Notes:  (Nursing/MHT/Case Management/Adjunct)  Date:  03/18/2014  Time: 0900 am  Type of Therapy:Self Inventory Group  Participation Level:  Active  Participation Quality:  Appropriate and Attentive  Affect:  Appropriate  Cognitive:  Alert and Appropriate  Insight:  Appropriate  Engagement in Group:  Engaged  Modes of Intervention:  Support  Summary of Progress/Problems: Patient feels she needs a mood stabilizer.  States her mood is all over the place.  Cranford Mon 03/18/2014, 9:49 AM

## 2014-03-19 DIAGNOSIS — F411 Generalized anxiety disorder: Secondary | ICD-10-CM

## 2014-03-19 MED ORDER — FERROUS SULFATE 325 (65 FE) MG PO TABS
325.0000 mg | ORAL_TABLET | Freq: Two times a day (BID) | ORAL | Status: DC
  Administered 2014-03-19: 325 mg via ORAL
  Filled 2014-03-19 (×4): qty 1
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
## 2014-03-19 NOTE — Progress Notes (Signed)
Adult Psychoeducational Group Note  Date:  03/19/2014 Time:  9:34 PM  Group Topic/Focus:  Wrap-Up Group:   The focus of this group is to help patients review their daily goal of treatment and discuss progress on daily workbooks.  Participation Level:  Active  Participation Quality:  Sharing and Supportive  Affect:  Appropriate  Cognitive:  Alert and Appropriate  Insight: Good  Engagement in Group:  Supportive  Modes of Intervention:  Activity and Support  Additional Comments:  PT was in group and was very active she talked about the things that where holding her back from doing good and what things she needs to do to do better and progress in her life.  Pettress, Delton R 03/19/2014, 9:34 PM

## 2014-03-19 NOTE — [facility] Group Notes (Signed)
[facility] LCSW Aftercare Discharge Planning Group Note   03/19/2014 9:48 AM  Participation Quality:  Active   Mood/Affect:  Anxious and Depressed  Depression Rating:  8  Anxiety Rating:  7  Thoughts of Suicide:  No Will you contract for safety?   Yes  Current AVH:  No  Plan for Discharge/Comments:  Patient reports her desire to follow up with outpatient providers. She reports that she does not have any at this time and will need referrals.   Transportation Means: Unknown at this time.  Supports: None per patient   Paulino Door, GREGORY C

## 2014-03-19 NOTE — Progress Notes (Signed)
Patient ID: Dana Elliott, female   DOB: 12/11/1980, 33 y.o.   MRN: [id number] She has been up and to groups interacting more with peers and staff. Has requested and received  Prn pain medication this AM that was effective. Her Self inventory: depressed and hopeless at 9, denies withdrawal symptoms, SI thoughts off and on contracts verbally for safety.
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
## 2014-03-20 DIAGNOSIS — F411 Generalized anxiety disorder: Secondary | ICD-10-CM

## 2014-03-20 LAB — TSH: TSH: 11.2 u[IU]/mL — ABNORMAL HIGH (ref 0.350–4.500)

## 2014-03-20 MED ORDER — FERROUS SULFATE 325 (65 FE) MG PO TABS
325.0000 mg | ORAL_TABLET | Freq: Two times a day (BID) | ORAL | Status: DC
  Administered 2014-03-20 – 2014-03-21 (×3): 325 mg via ORAL
  Filled 2014-03-20 (×3): qty 1
  Filled 2014-03-20: qty 28
  Filled 2014-03-20 (×2): qty 1
  Filled 2014-03-20: qty 28

## 2014-03-20 MED ORDER — CIPROFLOXACIN HCL 500 MG PO TABS
500.0000 mg | ORAL_TABLET | Freq: Two times a day (BID) | ORAL | Status: DC
  Administered 2014-03-20 (×2): 500 mg via ORAL
  Filled 2014-03-20 (×5): qty 1

## 2014-03-20 MED ORDER — LEVOTHYROXINE SODIUM 175 MCG PO TABS
175.0000 ug | ORAL_TABLET | Freq: Every day | ORAL | Status: DC
  Administered 2014-03-21: 175 ug via ORAL
  Filled 2014-03-20 (×3): qty 1

## 2014-03-20 MED ORDER — LEVOTHYROXINE SODIUM 200 MCG PO TABS: 200.0000 ug | ORAL_TABLET | Freq: Every day | ORAL | Status: DC

## 2014-03-20 MED ORDER — LEVOTHYROXINE SODIUM 25 MCG PO TABS
25.0000 ug | ORAL_TABLET | Freq: Once | ORAL | Status: AC
  Administered 2014-03-20: 25 ug via ORAL
  Filled 2014-03-20: qty 1

## 2014-03-20 NOTE — Progress Notes (Signed)
Patient ID: Dana Elliott, female   DOB: 1981-06-18, 33 y.o.   MRN: [id number]  Pt resting in bed with eyes closed. No distress noted.
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
## 2014-03-20 NOTE — Progress Notes (Signed)
Patient ID: Dana Elliott, female   DOB: 21-May-1981, 33 y.o.   MRN: [id number] She has been up and about and to groups, interacting with peers and staff. Has requested and received prn medication for arthritis pain,hands, and for a headache. Medication decreased pain. Self inventory: depression 7 down from 9, hopelessness 6 down from 9 yesteday. Continues to denie withdrawal symptoms, Denies SI thoughts.

## 2014-03-20 NOTE — [facility] Group Notes (Signed)
[facility] LCSW Group Therapy  03/20/2014 1:43 PM  Type of Therapy:  Group Therapy  Participation Level:  Did Not Attend-pt in room during group.  Smart, Heather LCSWA 03/20/2014, 1:43 PM

## 2014-03-20 NOTE — Progress Notes (Signed)
[facility] MD Progress Note  03/20/2014 5:45 PM Dana Elliott  MRN:  [id number] Subjective:  Found out that the pastor who was supposed to be trying to help her did not and she claims he told her she was an unfit mother and that the kids were taken care of by their father and that she needed to get her "ass moving" get a car, get a job. States this took her by surprise as for the last month she says he has led her to belief he was supportive of her trying to help her. States that her kids was the only thing that kept her from killing herself. States that without them she has no reason to go on Diagnosis:   DSM5: Schizophrenia Disorders:  none Obsessive-Compulsive Disorders:  none Trauma-Stressor Disorders:  none Substance/Addictive Disorders:  Opioid Disorder - Mild (305.50) Depressive Disorders:  Major Depressive Disorder - Severe (296.23) Total Time spent with patient: 30 minutes  Axis I: Anxiety Disorder NOS  ADL's:  Intact  Sleep: Fair  Appetite:  Fair  Suicidal Ideation:  Plan:  denies Intent:  denies Means:  denies Homicidal Ideation:  Plan:  denies Intent:  denies Means:  denies AEB (as evidenced by):  Psychiatric Specialty Exam: Physical Exam  Review of Systems  Constitutional: Negative.   HENT: Negative.   Eyes: Negative.   Respiratory: Negative.   Cardiovascular: Negative.   Gastrointestinal: Negative.   Genitourinary: Negative.   Musculoskeletal: Negative.   Skin: Negative.   Neurological: Negative.   Endo/Heme/Allergies: Negative.   Psychiatric/Behavioral: Positive for depression. The patient is nervous/anxious.     Blood pressure 105/78, pulse 102, temperature 97 F (36.1 C), temperature source Oral, resp. rate 20, height 6' (1.829 m), weight 94.802 kg (209 lb), last menstrual period 02/15/2014, SpO2 100.00%.Body mass index is 28.34 kg/(m^2).  General Appearance: Fairly Groomed  Patent attorney::  Fair  Speech:  Clear and Coherent  Volume:  Normal  Mood:  Anxious  and Depressed  Affect:  Depressed and Tearful  Thought Process:  Coherent and Goal Directed  Orientation:  Full (Time, Place, and Person)  Thought Content:  events, worries, concerns  Suicidal Thoughts:  After the phone call from the pastor has had thoughts  Homicidal Thoughts:  No  Memory:  Immediate;   Fair Recent;   Fair Remote;   Fair  Judgement:  Fair  Insight:  Present  Psychomotor Activity:  Restlessness  Concentration:  Fair  Recall:  [employer] of Knowledge:NA  Language: Fair  Akathisia:  No  Handed:    AIMS (if indicated):     Assets:  Desire for Improvement  Sleep:  Number of Hours: 6   Musculoskeletal: Strength & Muscle Tone: within normal limits Gait & Station: normal Patient leans: N/A  Current Medications: Current Facility-Administered Medications  Medication Dose Route Frequency Provider Last Rate Last Dose  . acetaminophen (TYLENOL) tablet 650 mg  650 mg Oral Q6H PRN Nanine Means, NP   650 mg at 03/20/14 1128  . alum & mag hydroxide-simeth (MAALOX/MYLANTA) 200-200-20 MG/5ML suspension 30 mL  30 mL Oral Q4H PRN Nanine Means, NP      . ciprofloxacin (CIPRO) tablet 500 mg  500 mg Oral Q12H Nehemiah Massed, MD   500 mg at 03/20/14 1004  . dicyclomine (BENTYL) tablet 20 mg  20 mg Oral Q6H PRN Nanine Means, NP      . ferrous sulfate tablet 325 mg  325 mg Oral BID AC Nehemiah Massed, MD   325 mg  at 03/20/14 1715  . FLUoxetine (PROZAC) capsule 20 mg  20 mg Oral QHS Truman Hayward, FNP   20 mg at 03/20/14 0806  . hydrOXYzine (ATARAX/VISTARIL) tablet 25 mg  25 mg Oral Q6H PRN Nanine Means, NP   25 mg at 03/19/14 2250  . [START ON 03/21/2014] levothyroxine (SYNTHROID, LEVOTHROID) tablet 175 mcg  175 mcg Oral QAC breakfast Rachael Fee, MD      . loperamide (IMODIUM) capsule 2-4 mg  2-4 mg Oral PRN Nanine Means, NP      . magnesium hydroxide (MILK OF MAGNESIA) suspension 30 mL  30 mL Oral Daily PRN Nanine Means, NP      . methocarbamol (ROBAXIN) tablet 500 mg  500 mg Oral  Q8H PRN Nanine Means, NP   500 mg at 03/20/14 1128  . nicotine polacrilex (NICORETTE) gum 2 mg  2 mg Oral PRN Kristeen Mans, NP   2 mg at 03/20/14 1128  . ondansetron (ZOFRAN-ODT) disintegrating tablet 4 mg  4 mg Oral Q6H PRN Nanine Means, NP      . traMADol Janean Sark) tablet 100 mg  100 mg Oral Q12H PRN Truman Hayward, FNP   100 mg at 03/20/14 6269  . zolpidem (AMBIEN) tablet 5 mg  5 mg Oral QHS PRN Nanine Means, NP   5 mg at 03/17/14 0112   Facility-Administered Medications Ordered in Other Encounters  Medication Dose Route Frequency Provider Last Rate Last Dose  . metroNIDAZOLE (FLAGYL) tablet 500 mg  500 mg Oral Once Jean Rosenthal, NP        Lab Results:  Results for orders placed during the hospital encounter of 03/16/14 (from the past 48 hour(s))  TSH     Status: Abnormal   Collection Time    03/19/14  7:50 PM      Result Value Ref Range   TSH 11.200 (*) 0.350 - 4.500 uIU/mL   Comment: Performed at [facility]    Physical Findings: AIMS: Facial and Oral Movements Muscles of Facial Expression: None, normal Lips and Perioral Area: None, normal Jaw: None, normal Tongue: None, normal,Extremity Movements Upper (arms, wrists, hands, fingers): None, normal Lower (legs, knees, ankles, toes): None, normal, Trunk Movements Neck, shoulders, hips: None, normal, Overall Severity Severity of abnormal movements (highest score from questions above): None, normal Incapacitation due to abnormal movements: None, normal Patient's awareness of abnormal movements (rate only patient's report): No Awareness, Dental Status Current problems with teeth and/or dentures?: No Does patient usually wear dentures?: No  CIWA:  CIWA-Ar Total: 1 COWS:  COWS Total Score: 1  Treatment Plan Summary: Daily contact with patient to assess and evaluate symptoms and progress in treatment Medication management  Plan: Supportive approach/coping skills           CBT;mindfulness/anxiety-stress  management           Encourage to get in touch with her brother and other supportive people           Optimize response to the psychotropics  Medical Decision Making Problem Points:  Review of psycho-social stressors (1) Data Points:  Review of medication regiment & side effects (2)  I certify that inpatient services furnished can reasonably be expected to improve the patient's condition.   LUGO,IRVING A 03/20/2014, 5:45 PM

## 2014-03-20 NOTE — Progress Notes (Signed)
[facility] - 0830  The focus of this group is to educate the patient on the purpose and policies of crisis stabilization and provide a format to answer questions about their admission.  The group details unit policies and expectations of patients while admitted.  Patient did not attend group.
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
## 2014-03-20 NOTE — Progress Notes (Signed)
Recreation Therapy Notes  Animal-Assisted Activity/Therapy (AAA/T) Program Checklist/Progress Notes Patient Eligibility Criteria Checklist & Daily Group note for Rec Tx Intervention  Date: 06.23.2015 Time: 2:45 Location: [address] Dayroom    AAA/T Program Assumption of Risk Form signed by Patient/ or Parent Legal Guardian yes  Patient is free of allergies or sever asthma yes  Patient reports no fear of animals yes  Patient reports no history of cruelty to animals yes   Patient understands his/her participation is voluntary yes  Patient washes hands before animal contact yes  Patient washes hands after animal contact yes  Behavioral Response: Appropriate   Education: Hand Washing, Appropriate Animal Interaction   Education Outcome: Acknowledges understanding  Clinical Observations/Feedback: Patient interacted appropriately with therapy dog, handler and MHT.   Denise L Blanchfield, LRT/CTRS        Blanchfield, Denise L 03/20/2014 4:54 PM 
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
## 2014-03-21 DIAGNOSIS — F321 Major depressive disorder, single episode, moderate: Secondary | ICD-10-CM

## 2014-03-21 DIAGNOSIS — F1120 Opioid dependence, uncomplicated: Secondary | ICD-10-CM

## 2014-03-21 MED ORDER — FERROUS SULFATE 325 (65 FE) MG PO TABS: 325.0000 mg | ORAL_TABLET | Freq: Two times a day (BID) | ORAL | Status: DC

## 2014-03-21 MED ORDER — CIPROFLOXACIN HCL 500 MG PO TABS: 500.0000 mg | ORAL_TABLET | Freq: Two times a day (BID) | ORAL | Status: DC

## 2014-03-21 MED ORDER — FLUOXETINE HCL 20 MG PO CAPS: 20.0000 mg | ORAL_CAPSULE | Freq: Every day | ORAL | Status: DC

## 2014-03-21 MED ORDER — HYDROXYZINE HCL 25 MG PO TABS: ORAL_TABLET | ORAL | Status: DC

## 2014-03-21 MED ORDER — LEVOTHYROXINE SODIUM 175 MCG PO TABS: 175.0000 ug | ORAL_TABLET | Freq: Every day | ORAL | Status: DC

## 2014-03-21 MED ORDER — HYDROXYZINE HCL 25 MG PO TABS
25.0000 mg | ORAL_TABLET | Freq: Three times a day (TID) | ORAL | Status: DC | PRN
  Filled 2014-03-21: qty 30

## 2014-03-21 MED ORDER — LEVOTHYROXINE SODIUM 100 MCG PO TABS
100.0000 ug | ORAL_TABLET | Freq: Every day | ORAL | Status: DC
  Filled 2014-03-21: qty 1

## 2014-03-21 MED ORDER — TRAMADOL HCL 50 MG PO TABS: 50.0000 mg | ORAL_TABLET | Freq: Three times a day (TID) | ORAL | Status: DC | PRN

## 2014-03-21 MED ORDER — ZOLPIDEM TARTRATE 5 MG PO TABS
5.0000 mg | ORAL_TABLET | Freq: Every evening | ORAL | Status: DC | PRN
Start: 2014-03-21 — End: 2015-06-01

## 2014-03-21 MED ORDER — LEVOTHYROXINE SODIUM 75 MCG PO TABS
75.0000 ug | ORAL_TABLET | Freq: Every day | ORAL | Status: DC
  Filled 2014-03-21: qty 1
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
## 2014-03-21 NOTE — Progress Notes (Signed)
Patient ID: Dana Elliott, female   DOB: 13-Apr-1981, 33 y.o.   MRN: [id number] Discharge note for 13:30 today. She has been discharged home and was picked up by a friend. She voiced understanding of discharge instruction and of the follow up plan She denies withdrawal symptoms and SI thoughts. All belonging taken home with her.
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
## 2014-03-21 NOTE — [facility] Suicide Risk Assessment (Signed)
Suicide Risk Assessment  Discharge Assessment     Demographic Factors:  Caucasian  Total Time spent with patient: 45 minutes  Psychiatric Specialty Exam:     Blood pressure 113/83, pulse 105, temperature 98 F (36.7 C), temperature source Oral, resp. rate 18, height 6' (1.829 m), weight 94.802 kg (209 lb), last menstrual period 02/15/2014, SpO2 100.00%.Body mass index is 28.34 kg/(m^2).  General Appearance: Fairly Groomed  Patent attorney::  Fair  Speech:  Clear and Coherent  Volume:  Normal  Mood:  anxious, worried  Affect:  Appropriate  Thought Process:  Coherent and Goal Directed  Orientation:  Full (Time, Place, and Person)  Thought Content:  worries and concerns as she moves on mostly having to do with the interactions with her husband and wanting to have her kids back  Suicidal Thoughts:  No  Homicidal Thoughts:  No  Memory:  Immediate;   Fair Recent;   Fair Remote;   Fair  Judgement:  Fair  Insight:  Present  Psychomotor Activity:  Normal  Concentration:  Fair  Recall:  [employer] of Knowledge:NA  Language: Fair  Akathisia:  No  Handed:    AIMS (if indicated):     Assets:  Desire for Improvement Social Support Vocational/Educational  Sleep:  Number of Hours: 6    Musculoskeletal: Strength & Muscle Tone: within normal limits Gait & Station: normal Patient leans: N/A   Mental Status Per Nursing Assessment::   On Admission:  Suicidal ideation indicated by patient  Current Mental Status by Physician: In full contact with reality. There are no active suicidal ideas, plans or intent. States she wants to get her life together and reunite with her kids ASAP   Loss Factors: Loss of significant relationship and Financial problems/change in socioeconomic status  Historical Factors: NA  Risk Reduction Factors:   Responsible for children under 53 years of age, Sense of responsibility to family and Living with another person, especially a relative  Continued  Clinical Symptoms:  Depression:   Insomnia  Cognitive Features That Contribute To Risk:  Polarized thinking Thought constriction (tunnel vision)    Suicide Risk:  Minimal: No identifiable suicidal ideation.  Patients presenting with no risk factors but with morbid ruminations; may be classified as minimal risk based on the severity of the depressive symptoms  Discharge Diagnoses:   AXIS I:  Major Depression, Anxiety Disorder AXIS II:  No diagnosis AXIS III:   Past Medical History  Diagnosis Date  . Thyroid disease   . Abnormal Pap smear   . Allergy   . Depression   . Rheumatoid arthritis(714.0)   . Anxiety   . Ovarian cyst   . PID (acute pelvic inflammatory disease)    AXIS IV:  other psychosocial or environmental problems AXIS V:  61-70 mild symptoms  Plan Of Care/Follow-up recommendations:  Activity:  as tolerated Diet:  regular Follow up outpatient basis/keep appointment with PCP to address the Thyroid and the Anemia Is patient on multiple antipsychotic therapies at discharge:  No   Has Patient had three or more failed trials of antipsychotic monotherapy by history:  No  Recommended Plan for Multiple Antipsychotic Therapies: NA    LUGO,IRVING A 03/21/2014, 1:19 PM

## 2014-03-21 NOTE — [facility] Group Notes (Signed)
[facility] LCSW Aftercare Discharge Planning Group Note   03/21/2014 9:43 AM  Participation Quality:  Appropriate   Mood/Affect:  Appropriate  Depression Rating:  4  Anxiety Rating:  7-worried about when I can see my kids.   Thoughts of Suicide:  No Will you contract for safety?   NA  Current AVH:  No  Plan for Discharge/Comments:  Pt hoping for d/c today so she can get followup started. She plans to live with brother temporarily and see her kids when she can until she finds a permanent place to stay. She plans to follow up at [facility] for med management/therapy services.   Transportation Means: brother   Supports: brother  Counselling psychologist, Lebron Quam

## 2014-03-23 NOTE — Progress Notes (Signed)
Patient Discharge Instructions:  After Visit Summary (AVS):   Faxed to:  03/23/14 Discharge Summary Note:   Faxed to:  03/23/14 Psychiatric Admission Assessment Note:   Faxed to:  03/23/14 Suicide Risk Assessment - Discharge Assessment:   Faxed to:  03/23/14 Faxed/Sent to the Next Level Care provider:  03/23/14 Faxed to [facility] @ [phone] Faxed to [facility] @ [phone] Faxed to [facility] @ [phone] Jerelene Redden, 03/23/2014, 1:50 PM

## 2014-04-14 ENCOUNTER — Emergency Department (HOSPITAL_BASED_OUTPATIENT_CLINIC_OR_DEPARTMENT_OTHER)
Admission: EM | Admit: 2014-04-14 | Discharge: 2014-04-14 | Disposition: A | Discharge status: Home / Self Care | Payer: Medicaid Other | Attending: Emergency Medicine | Admitting: Emergency Medicine

## 2014-04-14 ENCOUNTER — Encounter (HOSPITAL_BASED_OUTPATIENT_CLINIC_OR_DEPARTMENT_OTHER): Payer: Self-pay | Admitting: Emergency Medicine

## 2014-04-14 DIAGNOSIS — Z79899 Other long term (current) drug therapy: Secondary | ICD-10-CM | POA: Insufficient documentation

## 2014-04-14 DIAGNOSIS — M25539 Pain in unspecified wrist: Secondary | ICD-10-CM | POA: Insufficient documentation

## 2014-04-14 DIAGNOSIS — Z87891 Personal history of nicotine dependence: Secondary | ICD-10-CM | POA: Insufficient documentation

## 2014-04-14 DIAGNOSIS — Z8739 Personal history of other diseases of the musculoskeletal system and connective tissue: Secondary | ICD-10-CM | POA: Insufficient documentation

## 2014-04-14 DIAGNOSIS — E079 Disorder of thyroid, unspecified: Secondary | ICD-10-CM | POA: Insufficient documentation

## 2014-04-14 DIAGNOSIS — F329 Major depressive disorder, single episode, unspecified: Secondary | ICD-10-CM | POA: Insufficient documentation

## 2014-04-14 DIAGNOSIS — F411 Generalized anxiety disorder: Secondary | ICD-10-CM | POA: Insufficient documentation

## 2014-04-14 DIAGNOSIS — Z8742 Personal history of other diseases of the female genital tract: Secondary | ICD-10-CM | POA: Insufficient documentation

## 2014-04-14 DIAGNOSIS — Z880 Allergy status to penicillin: Secondary | ICD-10-CM | POA: Insufficient documentation

## 2014-04-14 DIAGNOSIS — M25532 Pain in left wrist: Secondary | ICD-10-CM

## 2014-04-14 DIAGNOSIS — F3289 Other specified depressive episodes: Secondary | ICD-10-CM | POA: Insufficient documentation

## 2014-04-14 MED ORDER — MELOXICAM 7.5 MG PO TABS: 7.5000 mg | ORAL_TABLET | Freq: Every day | ORAL | Status: DC

## 2014-04-14 MED ORDER — TRAMADOL HCL 50 MG PO TABS: 50.0000 mg | ORAL_TABLET | Freq: Four times a day (QID) | ORAL | Status: DC | PRN

## 2014-04-14 NOTE — Discharge Instructions (Signed)
Arthralgia °Your caregiver has diagnosed you as suffering from an arthralgia. Arthralgia means there is pain in a joint. This can come from many reasons including: °· Bruising the joint which causes soreness (inflammation) in the joint. °· Wear and tear on the joints which occur as we grow older (osteoarthritis). °· Overusing the joint. °· Various forms of arthritis. °· Infections of the joint. °Regardless of the cause of pain in your joint, most of these different pains respond to anti-inflammatory drugs and rest. The exception to this is when a joint is infected, and these cases are treated with antibiotics, if it is a bacterial infection. °HOME CARE INSTRUCTIONS  °· Rest the injured area for as long as directed by your caregiver. Then slowly start using the joint as directed by your caregiver and as the pain allows. Crutches as directed may be useful if the ankles, knees or hips are involved. If the knee was splinted or casted, continue use and care as directed. If an stretchy or elastic wrapping bandage has been applied today, it should be removed and re-applied every 3 to 4 hours. It should not be applied tightly, but firmly enough to keep swelling down. Watch toes and feet for swelling, bluish discoloration, coldness, numbness or excessive pain. If any of these problems (symptoms) occur, remove the ace bandage and re-apply more loosely. If these symptoms persist, contact your caregiver or return to this location. °· For the first 24 hours, keep the injured extremity elevated on pillows while lying down. °· Apply ice for 15-20 minutes to the sore joint every couple hours while awake for the first half day. Then 03-04 times per day for the first 48 hours. Put the ice in a plastic bag and place a towel between the bag of ice and your skin. °· Wear any splinting, casting, elastic bandage applications, or slings as instructed. °· Only take over-the-counter or prescription medicines for pain, discomfort, or fever as  directed by your caregiver. Do not use aspirin immediately after the injury unless instructed by your physician. Aspirin can cause increased bleeding and bruising of the tissues. °· If you were given crutches, continue to use them as instructed and do not resume weight bearing on the sore joint until instructed. °Persistent pain and inability to use the sore joint as directed for more than 2 to 3 days are warning signs indicating that you should see a caregiver for a follow-up visit as soon as possible. Initially, a hairline fracture (break in bone) may not be evident on X-rays. Persistent pain and swelling indicate that further evaluation, non-weight bearing or use of the joint (use of crutches or slings as instructed), or further X-rays are indicated. X-rays may sometimes not show a small fracture until a week or 10 days later. Make a follow-up appointment with your own caregiver or one to whom we have referred you. A radiologist (specialist in reading X-rays) may read your X-rays. Make sure you know how you are to obtain your X-ray results. Do not assume everything is normal if you do not hear from us. °SEEK MEDICAL CARE IF: °Bruising, swelling, or pain increases. °SEEK IMMEDIATE MEDICAL CARE IF:  °· Your fingers or toes are numb or blue. °· The pain is not responding to medications and continues to stay the same or get worse. °· The pain in your joint becomes severe. °· You develop a fever over 102° F (38.9° C). °· It becomes impossible to move or use the joint. °MAKE SURE YOU:  °·   Understand these instructions. °· Will watch your condition. °· Will get help right away if you are not doing well or get worse. °Document Released: 09/14/2005 Document Revised: 12/07/2011 Document Reviewed: 05/02/2008 °ExitCare® Patient Information ©2015 [employer]. This information is not intended to replace advice given to you by your health care provider. Make sure you discuss any questions you have with your health care  provider. ° °Cryotherapy °Cryotherapy means treatment with cold. Ice or gel packs can be used to reduce both pain and swelling. Ice is the most helpful within the first 24 to 48 hours after an injury or flareup from overusing a muscle or joint. Sprains, strains, spasms, burning pain, shooting pain, and aches can all be eased with ice. Ice can also be used when recovering from surgery. Ice is effective, has very few side effects, and is safe for most people to use. °PRECAUTIONS  °Ice is not a safe treatment option for people with: °· Raynaud's phenomenon. This is a condition affecting small blood vessels in the extremities. Exposure to cold may cause your problems to return. °· Cold hypersensitivity. There are many forms of cold hypersensitivity, including: °¨ Cold urticaria. Red, itchy hives appear on the skin when the tissues begin to warm after being iced. °¨ Cold erythema. This is a red, itchy rash caused by exposure to cold. °¨ Cold hemoglobinuria. Red blood cells break down when the tissues begin to warm after being iced. The hemoglobin that carry oxygen are passed into the urine because they cannot combine with blood proteins fast enough. °· Numbness or altered sensitivity in the area being iced. °If you have any of the following conditions, do not use ice until you have discussed cryotherapy with your caregiver: °· Heart conditions, such as arrhythmia, angina, or chronic heart disease. °· High blood pressure. °· Healing wounds or open skin in the area being iced. °· Current infections. °· Rheumatoid arthritis. °· Poor circulation. °· Diabetes. °Ice slows the blood flow in the region it is applied. This is beneficial when trying to stop inflamed tissues from spreading irritating chemicals to surrounding tissues. However, if you expose your skin to cold temperatures for too long or without the proper protection, you can damage your skin or nerves. Watch for signs of skin damage due to cold. °HOME CARE  INSTRUCTIONS °Follow these tips to use ice and cold packs safely. °· Place a dry or damp towel between the ice and skin. A damp towel will cool the skin more quickly, so you may need to shorten the time that the ice is used. °· For a more rapid response, add gentle compression to the ice. °· Ice for no more than 10 to 20 minutes at a time. The bonier the area you are icing, the less time it will take to get the benefits of ice. °· Check your skin after 5 minutes to make sure there are no signs of a poor response to cold or skin damage. °· Rest 20 minutes or more in between uses. °· Once your skin is numb, you can end your treatment. You can test numbness by very lightly touching your skin. The touch should be so light that you do not see the skin dimple from the pressure of your fingertip. When using ice, most people will feel these normal sensations in this order: cold, burning, aching, and numbness. °· Do not use ice on someone who cannot communicate their responses to pain, such as small children or people with dementia. °HOW TO   MAKE AN ICE PACK °Ice packs are the most common way to use ice therapy. Other methods include ice massage, ice baths, and cryo-sprays. Muscle creams that cause a cold, tingly feeling do not offer the same benefits that ice offers and should not be used as a substitute unless recommended by your caregiver. °To make an ice pack, do one of the following: °· Place crushed ice or a bag of frozen vegetables in a sealable plastic bag. Squeeze out the excess air. Place this bag inside another plastic bag. Slide the bag into a pillowcase or place a damp towel between your skin and the bag. °· Mix 3 parts water with 1 part rubbing alcohol. Freeze the mixture in a sealable plastic bag. When you remove the mixture from the freezer, it will be slushy. Squeeze out the excess air. Place this bag inside another plastic bag. Slide the bag into a pillowcase or place a damp towel between your skin and the  bag. °SEEK MEDICAL CARE IF: °· You develop white spots on your skin. This may give the skin a blotchy (mottled) appearance. °· Your skin turns blue or pale. °· Your skin becomes waxy or hard. °· Your swelling gets worse. °MAKE SURE YOU:  °· Understand these instructions. °· Will watch your condition. °· Will get help right away if you are not doing well or get worse. °Document Released: 05/11/2011 Document Revised: 12/07/2011 Document Reviewed: 05/11/2011 °ExitCare® Patient Information ©2015 [employer]. This information is not intended to replace advice given to you by your health care provider. Make sure you discuss any questions you have with your health care provider. ° °

## 2014-04-14 NOTE — ED Provider Notes (Signed)
CSN: [id number]     Arrival date & time 04/14/14  1803 History   First MD Initiated Contact with Patient 04/14/14 1823     Chief Complaint  Patient presents with  . Wrist Pain     (Consider location/radiation/quality/duration/timing/severity/associated sxs/prior Treatment) Patient is a 33 y.o. female presenting with wrist pain. The history is provided by the patient. No language interpreter was used.  Wrist Pain This is a new problem. Pertinent negatives include no chills, fever or numbness. Associated symptoms comments: Recurrent left wrist pain and swelling for the last couple of days without injury. She reports a history of RA, diagnosed by her doctor. No rheumatologic follow up or regular medications. No fever, redness or injury to the wrist. .    Past Medical History  Diagnosis Date  . Thyroid disease   . Abnormal Pap smear   . Allergy   . Depression   . Rheumatoid arthritis(714.0)   . Anxiety   . Ovarian cyst   . PID (acute pelvic inflammatory disease)    Past Surgical History  Procedure Laterality Date  . Tubal ligation    . Laparoscopic tubal ligation     Family History  Problem Relation Age of Onset  . Diabetes Mother   . Hypertension Mother   . Alcohol abuse Paternal Grandmother   . Hypothyroidism Paternal Grandfather   . Other Neg Hx    History  Substance Use Topics  . Smoking status: Former Smoker    Quit date: 03/16/2013  . Smokeless tobacco: Never Used  . Alcohol Use: No   OB History   Grav Para Term Preterm Abortions TAB SAB Ect Mult Living   4 2 1 1 2 2    2      Review of Systems  Constitutional: Negative for fever and chills.  Musculoskeletal:       See HPI.  Skin: Negative.  Negative for color change.  Neurological: Negative.  Negative for numbness.      Allergies  Penicillins; Reglan; and Toradol  Home Medications   Prior to Admission medications   Medication Sig Start Date End Date Taking? Authorizing Provider  ciprofloxacin  (CIPRO) 500 MG tablet Take 1 tablet (500 mg total) by mouth every 12 (twelve) hours. For infection 03/21/14   03/23/14, NP  ferrous sulfate 325 (65 FE) MG tablet Take 1 tablet (325 mg total) by mouth 2 (two) times daily before a meal. For anemia 03/21/14   03/23/14, NP  FLUoxetine (PROZAC) 20 MG capsule Take 1 capsule (20 mg total) by mouth at bedtime. For depression 03/21/14   03/23/14, NP  hydrOXYzine (ATARAX/VISTARIL) 25 MG tablet Take 1 tablet (25 mg) three times daily as needed: For anxiety 03/21/14   03/23/14, NP  levothyroxine (SYNTHROID, LEVOTHROID) 175 MCG tablet Take 1 tablet (175 mcg total) by mouth daily. For low thyroid function 03/21/14   03/23/14, NP  traMADol (ULTRAM) 50 MG tablet Take 1-2 tablets (50-100 mg total) by mouth 3 (three) times daily as needed for moderate pain. 03/21/14   03/23/14, NP  zolpidem (AMBIEN) 5 MG tablet Take 1 tablet (5 mg total) by mouth at bedtime as needed for sleep. 03/21/14   03/23/14, NP   BP 134/80  Pulse 99  Temp(Src) 97.8 F (36.6 C) (Oral)  Resp 19  Ht 5\' 6"  (1.676 m)  Wt 190 lb (86.183 kg)  BMI 30.68 kg/m2  SpO2 100%  LMP 03/18/2014 Physical  Exam  Constitutional: She is oriented to person, place, and time. She appears well-developed and well-nourished.  Neck: Normal range of motion.  Pulmonary/Chest: Effort normal.  Musculoskeletal:  Left wrist minimally swollen without redness. Resistance to range of motion. Full range of all digits. Significant tenderness to ulnar wrist .   Neurological: She is alert and oriented to person, place, and time.  Skin: Skin is warm and dry.    ED Course  Procedures (including critical care time) Labs Review Labs Reviewed - No data to display  Imaging Review No results found.   EKG Interpretation None      MDM   Final diagnoses:  None    1. Arthritis  History of the same with reported diagnosis of RA. No fever, redness - doubt septic joint or infection.  Patient's chart reviewed. Ultram provided along with Mobic. Stronger pain medications avoided with recent psychiatric hospitalization for opioid abuse/dependence.     Arnoldo Hooker, PA-C 04/14/14 [phone]

## 2014-04-14 NOTE — ED Notes (Signed)
Pt here with pain and swelling to left hand and pain to left wrist.  Not associated with any trauma.  Pt states that she has RA but she is concerned bc there is a cracking/popping noise when she moves her wrist.  Symptoms began thursday

## 2014-04-14 NOTE — ED Notes (Signed)
Pt ambulating independently w/ steady gait on d/c in no acute distress, A&Ox4. D/c instructions reviewed w/ pt and family - pt and family deny any further questions or concerns at present. Rx given x2  

## 2014-04-14 NOTE — ED Provider Notes (Signed)
Medical screening examination/treatment/procedure(s) were performed by non-physician practitioner and as supervising physician I was immediately available for consultation/collaboration.   EKG Interpretation None        Tiari Andringa N Kaijah Abts, DO 04/14/14 2320 

## 2014-04-28 ENCOUNTER — Encounter (HOSPITAL_BASED_OUTPATIENT_CLINIC_OR_DEPARTMENT_OTHER): Payer: Self-pay | Admitting: Emergency Medicine

## 2014-04-28 ENCOUNTER — Emergency Department (HOSPITAL_BASED_OUTPATIENT_CLINIC_OR_DEPARTMENT_OTHER)
Admission: EM | Admit: 2014-04-28 | Discharge: 2014-04-28 | Disposition: A | Discharge status: Home / Self Care | Payer: Self-pay | Attending: Emergency Medicine | Admitting: Emergency Medicine

## 2014-04-28 ENCOUNTER — Emergency Department (HOSPITAL_BASED_OUTPATIENT_CLINIC_OR_DEPARTMENT_OTHER): Payer: Medicaid Other

## 2014-04-28 DIAGNOSIS — Z79899 Other long term (current) drug therapy: Secondary | ICD-10-CM | POA: Insufficient documentation

## 2014-04-28 DIAGNOSIS — F329 Major depressive disorder, single episode, unspecified: Secondary | ICD-10-CM | POA: Insufficient documentation

## 2014-04-28 DIAGNOSIS — R51 Headache: Secondary | ICD-10-CM | POA: Insufficient documentation

## 2014-04-28 DIAGNOSIS — F3289 Other specified depressive episodes: Secondary | ICD-10-CM | POA: Insufficient documentation

## 2014-04-28 DIAGNOSIS — Z792 Long term (current) use of antibiotics: Secondary | ICD-10-CM | POA: Insufficient documentation

## 2014-04-28 DIAGNOSIS — F1010 Alcohol abuse, uncomplicated: Secondary | ICD-10-CM | POA: Insufficient documentation

## 2014-04-28 DIAGNOSIS — Z862 Personal history of diseases of the blood and blood-forming organs and certain disorders involving the immune mechanism: Secondary | ICD-10-CM | POA: Insufficient documentation

## 2014-04-28 DIAGNOSIS — R569 Unspecified convulsions: Secondary | ICD-10-CM | POA: Insufficient documentation

## 2014-04-28 DIAGNOSIS — Z8639 Personal history of other endocrine, nutritional and metabolic disease: Secondary | ICD-10-CM | POA: Insufficient documentation

## 2014-04-28 DIAGNOSIS — Z87891 Personal history of nicotine dependence: Secondary | ICD-10-CM | POA: Insufficient documentation

## 2014-04-28 DIAGNOSIS — R519 Headache, unspecified: Secondary | ICD-10-CM

## 2014-04-28 DIAGNOSIS — F411 Generalized anxiety disorder: Secondary | ICD-10-CM | POA: Insufficient documentation

## 2014-04-28 DIAGNOSIS — Z880 Allergy status to penicillin: Secondary | ICD-10-CM | POA: Insufficient documentation

## 2014-04-28 DIAGNOSIS — F10920 Alcohol use, unspecified with intoxication, uncomplicated: Secondary | ICD-10-CM

## 2014-04-28 DIAGNOSIS — Z8742 Personal history of other diseases of the female genital tract: Secondary | ICD-10-CM | POA: Insufficient documentation

## 2014-04-28 LAB — ETHANOL: Alcohol, Ethyl (B): 154 mg/dL — ABNORMAL HIGH (ref 0–11)

## 2014-04-28 MED ORDER — SODIUM CHLORIDE 0.9 % IV BOLUS (SEPSIS)
1000.0000 mL | Freq: Once | INTRAVENOUS | Status: AC
  Administered 2014-04-28: 1000 mL via INTRAVENOUS

## 2014-04-28 MED ORDER — ONDANSETRON HCL 4 MG/2ML IJ SOLN
4.0000 mg | Freq: Once | INTRAMUSCULAR | Status: AC
  Administered 2014-04-28: 4 mg via INTRAVENOUS
  Filled 2014-04-28: qty 2

## 2014-04-28 MED ORDER — ONDANSETRON 4 MG PO TBDP: 4.0000 mg | ORAL_TABLET | Freq: Three times a day (TID) | ORAL | Status: DC | PRN

## 2014-04-28 MED ORDER — IBUPROFEN 800 MG PO TABS
800.0000 mg | ORAL_TABLET | Freq: Once | ORAL | Status: AC
  Administered 2014-04-28: 800 mg via ORAL
  Filled 2014-04-28: qty 1

## 2014-04-28 NOTE — ED Notes (Signed)
Pt c/o headache for past few hours, states she vomited PTA but now just has nausea. Pt states she was standing in her yard trying to call 911 for transport when a cab "just happened to drive by" and pick her up to bring her to ED for evaluation. Admits to few glasses of wine tonight then the headache started, pt states she can no longer get in with her PCP and can't get any more of her needed meds.

## 2014-04-28 NOTE — ED Notes (Signed)
Patient transported to CT 

## 2014-04-28 NOTE — ED Notes (Signed)
Headache and nausea since stopping her meds: methotrexate, meloxicam, and tramadol.

## 2014-04-28 NOTE — ED Provider Notes (Addendum)
CSN: [id number]     Arrival date & time 04/28/14  0411 History   First MD Initiated Contact with Patient 04/28/14 [phone]     Chief Complaint  Patient presents with  . Headache     (Consider location/radiation/quality/duration/timing/severity/associated sxs/prior Treatment) HPI This is a 33 year old female with a history of rheumatoid arthritis. She also has a history of narcotic abuse (see ED visit 03/16/2014). She states she had been on meloxicam, methotrexate and tramadol which were being prescribed for her by Dr. Lillia Carmel at an Urgent Care. Dr. Margretta Ditty is no longer with that practice and she could not afford the $130 she was required to pay in order to see another physician. As a result she has not taken any of these medications for the past 4 days. Throughout those 4 days her generalized joint pain has returned but she has been able to deal with back pain. She has been having intermittent headaches as well, with no history of chronic headaches, which she describes as the worst headache she has ever had. These are generalized and throbbing. This morning about 2 AM she had a bowel movement and afterwards she had a syncopal episode or seizure; she awoke up to find herself having been incontinent of urine. She has no history of seizures in the past. Since this episode she has had a return of her headache accompanied by nausea and vomiting. Again she states the headache is severe. She admits to drinking alcohol earlier today.   Past Medical History  Diagnosis Date  . Thyroid disease   . Abnormal Pap smear   . Allergy   . Depression   . Rheumatoid arthritis(714.0)   . Anxiety   . Ovarian cyst   . PID (acute pelvic inflammatory disease)    Past Surgical History  Procedure Laterality Date  . Tubal ligation    . Laparoscopic tubal ligation     Family History  Problem Relation Age of Onset  . Diabetes Mother   . Hypertension Mother   . Alcohol abuse Paternal Grandmother   .  Hypothyroidism Paternal Grandfather   . Other Neg Hx    History  Substance Use Topics  . Smoking status: Former Smoker    Quit date: 03/16/2013  . Smokeless tobacco: Never Used  . Alcohol Use: No   OB History   Grav Para Term Preterm Abortions TAB SAB Ect Mult Living   4 2 1 1 2 2    2      Review of Systems  All other systems reviewed and are negative.   Allergies  Penicillins; Reglan; and Toradol  Home Medications   Prior to Admission medications   Medication Sig Start Date End Date Taking? Authorizing Provider  levothyroxine (SYNTHROID, LEVOTHROID) 175 MCG tablet Take 1 tablet (175 mcg total) by mouth daily. For low thyroid function 03/21/14  Yes 03/23/14, NP  ciprofloxacin (CIPRO) 500 MG tablet Take 1 tablet (500 mg total) by mouth every 12 (twelve) hours. For infection 03/21/14   03/23/14, NP  ferrous sulfate 325 (65 FE) MG tablet Take 1 tablet (325 mg total) by mouth 2 (two) times daily before a meal. For anemia 03/21/14   03/23/14, NP  FLUoxetine (PROZAC) 20 MG capsule Take 1 capsule (20 mg total) by mouth at bedtime. For depression 03/21/14   03/23/14, NP  hydrOXYzine (ATARAX/VISTARIL) 25 MG tablet Take 1 tablet (25 mg) three times daily as needed: For anxiety 03/21/14   03/23/14  [place], NP  meloxicam (MOBIC) 7.5 MG tablet Take 1 tablet (7.5 mg total) by mouth daily. 04/14/14   Shari A Upstill, PA-C  traMADol (ULTRAM) 50 MG tablet Take 1-2 tablets (50-100 mg total) by mouth 3 (three) times daily as needed for moderate pain. 03/21/14   Sanjuana Kava, NP  traMADol (ULTRAM) 50 MG tablet Take 1 tablet (50 mg total) by mouth every 6 (six) hours as needed. 04/14/14   Shari A Upstill, PA-C  zolpidem (AMBIEN) 5 MG tablet Take 1 tablet (5 mg total) by mouth at bedtime as needed for sleep. 03/21/14   Sanjuana Kava, NP   BP 120/78  Pulse 99  Temp(Src) 97.9 F (36.6 C) (Oral)  Resp 20  SpO2 99%  LMP 04/23/2014  Physical Exam General: Well-developed, well-nourished  female in no acute distress; appearance consistent with age of record HENT: normocephalic; atraumatic; breath smells of alcohol; no bite marks to tongue Eyes: pupils equal, round and reactive to light; extraocular muscles intact Neck: supple Heart: regular rate and rhythm Lungs: clear to auscultation bilaterally Abdomen: soft; nondistended; nontender; bowel sounds present Extremities: No deformity; full range of motion; pulses normal Neurologic: Awake, alert and oriented; mildly ataxic; motor function intact in all extremities and symmetric; no facial droop; normal speech Skin: Warm and dry Psychiatric: Flat affect    ED Course  Procedures (including critical care time)  MDM   Nursing notes and vitals signs, including pulse oximetry, reviewed.  Summary of this visit's results, reviewed by myself:  Labs:  Results for orders placed during the hospital encounter of 04/28/14 (from the past 24 hour(s))  ETHANOL     Status: Abnormal   Collection Time    04/28/14  5:30 AM      Result Value Ref Range   Alcohol, Ethyl (B) 154 (*) 0 - 11 mg/dL    Imaging Studies: Ct Head Wo Contrast  04/28/2014   CLINICAL DATA:  Headache, nausea vomiting.  EXAM: CT HEAD WITHOUT CONTRAST  TECHNIQUE: Contiguous axial images were obtained from the base of the skull through the vertex without intravenous contrast.  COMPARISON:  CT of the head June 25, 2012  FINDINGS: The ventricles and sulci are normal. No intraparenchymal hemorrhage, mass effect nor midline shift. No acute large vascular territory infarcts.  No abnormal extra-axial fluid collections. Basal cisterns are patent.  No skull fracture. The included ocular globes and orbital contents are non-suspicious. Under pneumatized right mastoid tip, without mastoid effusion.  IMPRESSION: No acute intracranial process ; normal noncontrast CT of the head.   Electronically Signed   By: Awilda Metro   On: 04/28/2014 06:15   6:49 AM The patient was  sleeping comfortably, but when avakened continues to complain of a headache despite IV fluid bolus 800 mg of ibuprofen by mouth. Her nausea is improved after IV Zofran.  The patient's records indicate she was seen at [facility] on July 21 of this year. She was prescribed 28 Mobic tablets and 40 Ultram 50mg  tablets. The patient admits to this visit and admits that she was taking about 5 Ultram tablets a day. The patient's history is suspicious for seizure and the most likely etiology would be Ultram. We have now listed Ultram as an allergy and she was advised not to take it again. She was also advised not to drive for the next 6 months per state law or until cleared by a neurologist. She was advised that with her history of narcotic abuse and her current  alcohol intoxication that narcotics are not indicated. Further she is allergic to Reglan and Toradol.    Hanley Seamen, MD 04/28/14 2993  Hanley Seamen, MD 04/28/14 [id number]

## 2014-04-28 NOTE — ED Notes (Signed)
Pt given cab voucher to [employer].

## 2014-04-28 NOTE — ED Notes (Signed)
MD at bedside. 

## 2014-04-28 NOTE — ED Notes (Signed)
Pt arrived to ED in a cab alone, pt in w/c speaking in complete sentences in NAD. Pt admits to ETOH tonight, alert and oriented x4.

## 2014-05-24 ENCOUNTER — Encounter (HOSPITAL_BASED_OUTPATIENT_CLINIC_OR_DEPARTMENT_OTHER): Payer: Self-pay | Admitting: Emergency Medicine

## 2014-05-24 ENCOUNTER — Emergency Department (HOSPITAL_BASED_OUTPATIENT_CLINIC_OR_DEPARTMENT_OTHER)
Admission: EM | Admit: 2014-05-24 | Discharge: 2014-05-24 | Disposition: A | Discharge status: Home / Self Care | Payer: Medicaid Other | Attending: Emergency Medicine | Admitting: Emergency Medicine

## 2014-05-24 DIAGNOSIS — M25532 Pain in left wrist: Secondary | ICD-10-CM

## 2014-05-24 DIAGNOSIS — M25439 Effusion, unspecified wrist: Secondary | ICD-10-CM | POA: Insufficient documentation

## 2014-05-24 DIAGNOSIS — F3289 Other specified depressive episodes: Secondary | ICD-10-CM | POA: Insufficient documentation

## 2014-05-24 DIAGNOSIS — Z880 Allergy status to penicillin: Secondary | ICD-10-CM | POA: Insufficient documentation

## 2014-05-24 DIAGNOSIS — M25539 Pain in unspecified wrist: Secondary | ICD-10-CM | POA: Insufficient documentation

## 2014-05-24 DIAGNOSIS — F411 Generalized anxiety disorder: Secondary | ICD-10-CM | POA: Insufficient documentation

## 2014-05-24 DIAGNOSIS — Z791 Long term (current) use of non-steroidal anti-inflammatories (NSAID): Secondary | ICD-10-CM | POA: Insufficient documentation

## 2014-05-24 DIAGNOSIS — Z8742 Personal history of other diseases of the female genital tract: Secondary | ICD-10-CM | POA: Insufficient documentation

## 2014-05-24 DIAGNOSIS — Z87891 Personal history of nicotine dependence: Secondary | ICD-10-CM | POA: Insufficient documentation

## 2014-05-24 DIAGNOSIS — E079 Disorder of thyroid, unspecified: Secondary | ICD-10-CM | POA: Insufficient documentation

## 2014-05-24 DIAGNOSIS — Z79899 Other long term (current) drug therapy: Secondary | ICD-10-CM | POA: Insufficient documentation

## 2014-05-24 DIAGNOSIS — M069 Rheumatoid arthritis, unspecified: Secondary | ICD-10-CM | POA: Insufficient documentation

## 2014-05-24 DIAGNOSIS — F329 Major depressive disorder, single episode, unspecified: Secondary | ICD-10-CM | POA: Insufficient documentation

## 2014-05-24 MED ORDER — PREDNISONE 10 MG PO TABS: ORAL_TABLET | ORAL | Status: DC

## 2014-05-24 MED ORDER — OXYCODONE-ACETAMINOPHEN 5-325 MG PO TABS: 1.0000 | ORAL_TABLET | Freq: Four times a day (QID) | ORAL | Status: DC | PRN

## 2014-05-24 NOTE — ED Notes (Signed)
MD at bedside. 

## 2014-05-24 NOTE — ED Notes (Signed)
Heartrate noted. States her heartrate is always fast when she is stressed from pain. Not unusual for her.

## 2014-05-24 NOTE — ED Notes (Signed)
Pain in her left wrist and forearm. Hx of arthritis.

## 2014-05-24 NOTE — ED Provider Notes (Signed)
CSN: [id number]     Arrival date & time 05/24/14  1802 History  This chart was scribed for Purvis Sheffield, MD by [place], ED Scribe. The patient was seen in [facility]. The patient's care was started at 8:05 PM.      Chief Complaint  Patient presents with  . Wrist Pain     Patient is a 33 y.o. female presenting with wrist pain. The history is provided by the patient. No language interpreter was used.  Wrist Pain This is a recurrent problem. The current episode started 2 days ago. The problem occurs constantly. The problem has been gradually worsening. Pertinent negatives include no chest pain, no abdominal pain, no headaches and no shortness of breath. Exacerbated by: movement of wrist.   HPI Comments: Dana Elliott is a 33 y.o. female who presents to the Emergency Department complaining of reoccurring left wrist pain onset Tuesday night with associated swelling. Pt states that the pain and swelling even extend into her fingers and distal aspect of her forearm. She goes on to state that she deals with this issue atleast 3 or 4 times a year. Pt has history of rheumatoid arthritis. She states she has seen two doctors previously that both told her that this is problem is not related to her RA. 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 She is on Tramadol and Meloxicam for her RA currently. Pt is former smoker (Quit date: 03/16/2013). Pt is on medicaid.    Past Medical History  Diagnosis Date  . Thyroid disease   . Abnormal Pap smear   . Allergy   . Depression   . Rheumatoid arthritis(714.0)   . Anxiety   . Ovarian cyst   . PID (acute pelvic inflammatory disease)    Past Surgical History  Procedure Laterality Date  . Tubal ligation    . Laparoscopic tubal ligation     Family History  Problem Relation Age of Onset  . Diabetes Mother   . Hypertension Mother   . Alcohol abuse Paternal Grandmother   . Hypothyroidism Paternal Grandfather   . Other Neg Hx     History  Substance Use Topics  . Smoking status: Former Smoker    Quit date: 03/16/2013  . Smokeless tobacco: Never Used  . Alcohol Use: No   OB History   Grav Para Term Preterm Abortions TAB SAB Ect Mult Living   4 2 1 1 2 2    2      Review of Systems  Constitutional: Negative for fever and fatigue.  HENT: Negative for congestion and drooling.   Eyes: Negative for pain.  Respiratory: Negative for cough and shortness of breath.   Cardiovascular: Negative for chest pain.  Gastrointestinal: Negative for nausea, vomiting, abdominal pain and diarrhea.  Genitourinary: Negative for dysuria and hematuria.  Musculoskeletal: Negative for neck pain.       Tenderness and swelling of L wrist.   Skin: Negative for color change.  Neurological: Negative for dizziness and headaches.  Hematological: Negative for adenopathy.  Psychiatric/Behavioral: Negative for behavioral problems.  All other systems reviewed and are negative.     Allergies  Penicillins; Tramadol; Reglan; and Toradol  Home Medications   Prior to Admission medications   Medication Sig Start Date End Date Taking? Authorizing Provider  ferrous sulfate 325 (65 FE) MG tablet Take 1 tablet (325 mg total) by mouth 2 (two) times daily before a meal. For anemia 03/21/14   03/23/14, NP  FLUoxetine (PROZAC) 20 MG capsule Take 1 capsule (20 mg total) by mouth at bedtime. For depression 03/21/14   Sanjuana Kava, NP  hydrOXYzine (ATARAX/VISTARIL) 25 MG tablet Take 1 tablet (25 mg) three times daily as needed: For anxiety 03/21/14   Sanjuana Kava, NP  levothyroxine (SYNTHROID, LEVOTHROID) 175 MCG tablet Take 1 tablet (175 mcg total) by mouth daily. For low thyroid function 03/21/14   Sanjuana Kava, NP  meloxicam (MOBIC) 7.5 MG tablet Take 1 tablet (7.5 mg total) by mouth daily. 04/14/14   Shari A Upstill, PA-C  ondansetron (ZOFRAN ODT) 4 MG disintegrating tablet Take 1 tablet (4 mg total) by mouth every 8 (eight) hours as needed for  nausea or vomiting. 04/28/14   John L Molpus, MD  zolpidem (AMBIEN) 5 MG tablet Take 1 tablet (5 mg total) by mouth at bedtime as needed for sleep. 03/21/14   Sanjuana Kava, NP   BP 117/79  Pulse 135  Temp(Src) 97.7 F (36.5 C) (Oral)  Resp 20  Ht 5\' 6"  (1.676 m)  Wt 190 lb (86.183 kg)  BMI 30.68 kg/m2  SpO2 99%  LMP 04/23/2014 Physical Exam  Nursing note and vitals reviewed. Constitutional: She is oriented to person, place, and time. She appears well-developed and well-nourished. No distress.  HENT:  Head: Normocephalic and atraumatic.  Eyes: Conjunctivae and EOM are normal. Pupils are equal, round, and reactive to light.  Neck: Normal range of motion. Neck supple. No tracheal deviation present.  Cardiovascular: Normal rate and regular rhythm.  Exam reveals no gallop and no friction rub.   No murmur heard. Pulmonary/Chest: Effort normal and breath sounds normal. No respiratory distress. She has no wheezes. She has no rales.  Abdominal: Soft. Bowel sounds are normal. She exhibits no distension. There is no tenderness. There is no rebound and no guarding.  Musculoskeletal: Normal range of motion. She exhibits tenderness. She exhibits no edema.  Diffuse mild swelling of L wrist. No erythema. 2+ ulnar and radial pulses. Normal ROM of digits of L hand. Moderately limited ROM of L wrist due to pain. Focal TTP to ulnar wrist and base of L thumb.   Neurological: She is alert and oriented to person, place, and time.  Skin: Skin is warm and dry.  Psychiatric: She has a normal mood and affect. Her behavior is normal.    ED Course  Procedures (including critical care time) Labs Review Labs Reviewed - No data to display  Results for orders placed during the hospital encounter of 04/28/14  ETHANOL      Result Value Ref Range   Alcohol, Ethyl (B) 154 (*) 0 - 11 mg/dL   Ct Head Wo Contrast  04/28/2014   CLINICAL DATA:  Headache, nausea vomiting.  EXAM: CT HEAD WITHOUT CONTRAST  TECHNIQUE:  Contiguous axial images were obtained from the base of the skull through the vertex without intravenous contrast.  COMPARISON:  CT of the head June 25, 2012  FINDINGS: The ventricles and sulci are normal. No intraparenchymal hemorrhage, mass effect nor midline shift. No acute large vascular territory infarcts.  No abnormal extra-axial fluid collections. Basal cisterns are patent.  No skull fracture. The included ocular globes and orbital contents are non-suspicious. Under pneumatized right mastoid tip, without mastoid effusion.  IMPRESSION: No acute intracranial process ; normal noncontrast CT of the head.   Electronically Signed   By: June 27, 2012   On: 04/28/2014 06:15    Imaging Review No results found.   EKG  Interpretation None     Medications - No data to display  8:11 PM- Treatment plan was discussed with patient who verbalizes understanding and agrees.   MDM   Final diagnoses:  Left wrist pain    8:23 PM 33 y.o. female here w/ left wrist pain. Hx of RA. Has pain exacerbations 2-3 times per year. Seen here and benefitted from prednisone/percocet in the past. Pt is currently getting f/u w/ an orthopedist, waiting for medicaid. She denies any injury. Do not think this is a septic joint.   8:25 PM:  I have discussed the diagnosis/risks/treatment options with the patient and believe the pt to be eligible for discharge home to follow-up with her Dr. Merlyn Lot vs pcp referral. We also discussed returning to the ED immediately if new or worsening sx occur. We discussed the sx which are most concerning (e.g., worsening pain, fever, redness) that necessitate immediate return. Medications administered to the patient during their visit and any new prescriptions provided to the patient are listed below.  Medications given during this visit Medications - No data to display  New Prescriptions   OXYCODONE-ACETAMINOPHEN (PERCOCET) 5-325 MG PER TABLET    Take 1 tablet by mouth every 6 (six)  hours as needed for moderate pain.   PREDNISONE (DELTASONE) 10 MG TABLET    Take as a 6 day taper. 6 on day one. 5 on day 2. 4 on day 3. 3 on day 4. 2 on day 5. 1 on day 6.     I personally performed the services described in this documentation, which was scribed in my presence. The recorded information has been reviewed and is accurate.     Purvis Sheffield, MD 05/24/14 2026

## 2014-07-20 ENCOUNTER — Encounter (HOSPITAL_BASED_OUTPATIENT_CLINIC_OR_DEPARTMENT_OTHER): Payer: Self-pay | Admitting: Emergency Medicine

## 2014-07-20 ENCOUNTER — Emergency Department (HOSPITAL_BASED_OUTPATIENT_CLINIC_OR_DEPARTMENT_OTHER)
Admission: EM | Admit: 2014-07-20 | Discharge: 2014-07-20 | Disposition: A | Discharge status: Home / Self Care | Payer: No Typology Code available for payment source | Attending: Emergency Medicine | Admitting: Emergency Medicine

## 2014-07-20 DIAGNOSIS — F419 Anxiety disorder, unspecified: Secondary | ICD-10-CM | POA: Insufficient documentation

## 2014-07-20 DIAGNOSIS — M069 Rheumatoid arthritis, unspecified: Secondary | ICD-10-CM | POA: Insufficient documentation

## 2014-07-20 DIAGNOSIS — Z8742 Personal history of other diseases of the female genital tract: Secondary | ICD-10-CM | POA: Insufficient documentation

## 2014-07-20 DIAGNOSIS — Z880 Allergy status to penicillin: Secondary | ICD-10-CM | POA: Insufficient documentation

## 2014-07-20 DIAGNOSIS — M79642 Pain in left hand: Secondary | ICD-10-CM | POA: Insufficient documentation

## 2014-07-20 DIAGNOSIS — Z79899 Other long term (current) drug therapy: Secondary | ICD-10-CM | POA: Insufficient documentation

## 2014-07-20 DIAGNOSIS — Z87891 Personal history of nicotine dependence: Secondary | ICD-10-CM | POA: Insufficient documentation

## 2014-07-20 DIAGNOSIS — E079 Disorder of thyroid, unspecified: Secondary | ICD-10-CM | POA: Insufficient documentation

## 2014-07-20 DIAGNOSIS — Z7952 Long term (current) use of systemic steroids: Secondary | ICD-10-CM | POA: Insufficient documentation

## 2014-07-20 DIAGNOSIS — F329 Major depressive disorder, single episode, unspecified: Secondary | ICD-10-CM | POA: Insufficient documentation

## 2014-07-20 DIAGNOSIS — M79641 Pain in right hand: Secondary | ICD-10-CM

## 2014-07-20 DIAGNOSIS — Z791 Long term (current) use of non-steroidal anti-inflammatories (NSAID): Secondary | ICD-10-CM | POA: Insufficient documentation

## 2014-07-20 MED ORDER — HYDROCODONE-ACETAMINOPHEN 5-325 MG PO TABS
1.0000 | ORAL_TABLET | Freq: Four times a day (QID) | ORAL | Status: DC | PRN
Start: 2014-07-20 — End: 2015-06-01

## 2014-07-20 MED ORDER — PREDNISONE 10 MG PO TABS: ORAL_TABLET | ORAL | Status: DC

## 2014-07-20 NOTE — ED Provider Notes (Signed)
CSN: [id number]     Arrival date & time 07/20/14  1722 History  This chart was scribed for Purvis Sheffield, MD by Elon Spanner, ED Scribe. This patient was seen in room MH04/MH04 and the patient's care was started at 6:05 PM.   Chief Complaint  Patient presents with  . Hand Pain   The history is provided by the patient. No language interpreter was used.   HPI Comments: Dana Elliott is a 32 y.o. female with a history of rheumatoid arthritis with recurrent left hand pain.  Patient was seen 3 months ago for the a similar complaint of left hand pain and received negative imaging.  She was given percocet and prednisone and states these helped significantly relieve her pain.  She was referred to [facility] by Dr. Jacqulyn Bath, her PCP, and has an appointment in mid-November.  She was not seen yet by was told that a UTS or potentially MRI would be performed at this time.  She states she has been told before that this pain is not related to her RA.  She presents to the Emergency Department complaining of recurrent, gradually worsening, bilateral hand pain in the wrists and joints with associated swelling onset 2 days ago.  She describes the pain as the sensation of being swollen but denies a sharp or throbbing sensation.  Patient reports this pain is different from previous episodes of hand pain in that it has manifested in the bilateral hands versus solely the left hand.  She also identifies the quality of pain as feeling different.  She reports that she has had similar pain historically every 6 months but recently the episodes have become more frequent recently , occurring every 2-3 months.  Patient denies fever    Past Medical History  Diagnosis Date  . Thyroid disease   . Abnormal Pap smear   . Allergy   . Depression   . Rheumatoid arthritis(714.0)   . Anxiety   . Ovarian cyst   . PID (acute pelvic inflammatory disease)    Past Surgical History  Procedure Laterality Date  . Tubal  ligation    . Laparoscopic tubal ligation     Family History  Problem Relation Age of Onset  . Diabetes Mother   . Hypertension Mother   . Alcohol abuse Paternal Grandmother   . Hypothyroidism Paternal Grandfather   . Other Neg Hx    History  Substance Use Topics  . Smoking status: Former Smoker    Quit date: 03/16/2013  . Smokeless tobacco: Never Used  . Alcohol Use: No   OB History   Grav Para Term Preterm Abortions TAB SAB Ect Mult Living   4 2 1 1 2 2    2      Review of Systems  Constitutional: Negative for appetite change and fatigue.  HENT: Negative for congestion, ear discharge and sinus pressure.   Eyes: Negative for discharge.  Respiratory: Negative for cough.   Cardiovascular: Negative for chest pain.  Gastrointestinal: Negative for abdominal pain and diarrhea.  Genitourinary: Negative for frequency and hematuria.  Musculoskeletal: Positive for arthralgias and joint swelling. Negative for back pain.  Skin: Negative for rash.  Neurological: Negative for seizures, weakness, numbness and headaches.  Psychiatric/Behavioral: Negative for hallucinations.      Allergies  Penicillins; Tramadol; Reglan; and Toradol  Home Medications   Prior to Admission medications   Medication Sig Start Date End Date Taking? Authorizing Provider  ferrous sulfate 325 (65 FE) MG tablet Take 1 tablet (325  mg total) by mouth 2 (two) times daily before a meal. For anemia 03/21/14   Sanjuana Kava, NP  FLUoxetine (PROZAC) 20 MG capsule Take 1 capsule (20 mg total) by mouth at bedtime. For depression 03/21/14   Sanjuana Kava, NP  hydrOXYzine (ATARAX/VISTARIL) 25 MG tablet Take 1 tablet (25 mg) three times daily as needed: For anxiety 03/21/14   Sanjuana Kava, NP  levothyroxine (SYNTHROID, LEVOTHROID) 175 MCG tablet Take 1 tablet (175 mcg total) by mouth daily. For low thyroid function 03/21/14   Sanjuana Kava, NP  meloxicam (MOBIC) 7.5 MG tablet Take 1 tablet (7.5 mg total) by mouth daily.  04/14/14   Shari A Upstill, PA-C  ondansetron (ZOFRAN ODT) 4 MG disintegrating tablet Take 1 tablet (4 mg total) by mouth every 8 (eight) hours as needed for nausea or vomiting. 04/28/14   Carlisle Beers Molpus, MD  oxyCODONE-acetaminophen (PERCOCET) 5-325 MG per tablet Take 1 tablet by mouth every 6 (six) hours as needed for moderate pain. 05/24/14   Purvis Sheffield, MD  predniSONE (DELTASONE) 10 MG tablet Take as a 6 day taper. 6 on day one. 5 on day 2. 4 on day 3. 3 on day 4. 2 on day 5. 1 on day 6. 05/24/14   Purvis Sheffield, MD  zolpidem (AMBIEN) 5 MG tablet Take 1 tablet (5 mg total) by mouth at bedtime as needed for sleep. 03/21/14   Sanjuana Kava, NP   BP 116/83  Pulse 130  Temp(Src) 97.8 F (36.6 C) (Oral)  Resp 20  Ht 5\' 6"  (1.676 m)  Wt 190 lb (86.183 kg)  BMI 30.68 kg/m2  SpO2 98%  LMP 07/15/2014 Physical Exam  Nursing note and vitals reviewed. Constitutional: She is oriented to person, place, and time. She appears well-developed and well-nourished.  Non-toxic appearance. She does not appear ill. No distress.  HENT:  Head: Normocephalic and atraumatic.  Right Ear: External ear normal.  Left Ear: External ear normal.  Nose: Nose normal. No mucosal edema or rhinorrhea.  Mouth/Throat: Oropharynx is clear and moist and mucous membranes are normal. No dental abscesses or uvula swelling.  Eyes: Conjunctivae and EOM are normal. Pupils are equal, round, and reactive to light.  Neck: Normal range of motion and full passive range of motion without pain. Neck supple.  Cardiovascular: Normal rate, regular rhythm and normal heart sounds.  Exam reveals no gallop and no friction rub.   No murmur heard. Pulmonary/Chest: Effort normal and breath sounds normal. No respiratory distress. She has no wheezes. She has no rhonchi. She has no rales. She exhibits no tenderness and no crepitus.  Abdominal: Soft. Normal appearance and bowel sounds are normal. She exhibits no distension. There is no tenderness.  There is no rebound and no guarding.  Musculoskeletal: Normal range of motion. She exhibits tenderness. She exhibits no edema.  Diffuse TTP of both wrists and the base of the thumbs bilaterally.  TTP at the 2nd and 3rd MCP bilaterally.   No significant swelling of the hands or arms. Normal motor skills and sensation of the hands bilaterally. 2+ distal pulses in the UE's  Neurological: She is alert and oriented to person, place, and time. She has normal strength. No cranial nerve deficit.  Skin: Skin is warm, dry and intact. No rash noted. No erythema. No pallor.  Psychiatric: She has a normal mood and affect. Her speech is normal and behavior is normal. Her mood appears not anxious.    ED Course  Procedures (  including critical care time)  DIAGNOSTIC STUDIES: Oxygen Saturation is 98% on RA, normal by my interpretation.    COORDINATION OF CARE:  6:12 PM Discussed plan to order pain medication and steroids.   Also discussed returning to the ED immediately if new or worsening sx occur. We discussed the sx which are most concerning (e.g., worsening pain, fever, redness) that necessitate immediate return.   Labs Review Labs Reviewed - No data to display  Imaging Review No results found.   EKG Interpretation None      MDM   Final diagnoses:  Bilateral hand pain    6:41 PM 33 y.o. female with a history of left wrist pain who I have seen previously in the past presents with bilateral wrist and hand pain. She denies any fevers. Her pain is consistent with previous intermittent wrist pain she has had in the past. She states that she has followup with [facility] orthopedics and has an appointment in November. She states when she has exacerbations of pain in her wrist she has benefited from a course of steroids and pain medicine. This benefited her during her last evaluation. I do not think she has a septic joint. No injury per pt. No imaging necessary as she has had imaging in the past.    Chart review shows a psych admission w/ dx of opioid dependence. I asked her about this and she states it relates to her chronic use of tramadol.   6:42 PM:  I have discussed the diagnosis/risks/treatment options with the patient and believe the pt to be eligible for discharge home to follow-up with [facility] as scheduled. We also discussed returning to the ED immediately if new or worsening sx occur. We discussed the sx which are most concerning (e.g., worsening pain, swelling, fever) that necessitate immediate return. Medications administered to the patient during their visit and any new prescriptions provided to the patient are listed below.  Medications given during this visit Medications - No data to display  New Prescriptions   HYDROCODONE-ACETAMINOPHEN (NORCO) 5-325 MG PER TABLET    Take 1 tablet by mouth every 6 (six) hours as needed for moderate pain.   PREDNISONE (DELTASONE) 10 MG TABLET    Take as a 6 day taper. 6 on day one. 5 on day 2. 4 on day 3. 3 on day 4. 2 on day 5. 1 on day 6.        I personally performed the services described in this documentation, which was scribed in my presence. The recorded information has been reviewed and is accurate.    Purvis Sheffield, MD 07/21/14 [phone]

## 2014-07-20 NOTE — ED Notes (Signed)
Pain and swelling in her hands. States she has a lot of medical bills and would like to defer testing and only get a Rx for Prednisone and Percocet. She has an appointment to have surgery for possible carpal tunnel in a month.

## 2014-07-30 ENCOUNTER — Encounter (HOSPITAL_BASED_OUTPATIENT_CLINIC_OR_DEPARTMENT_OTHER): Payer: Self-pay | Admitting: Emergency Medicine

## 2014-08-30 ENCOUNTER — Emergency Department (HOSPITAL_COMMUNITY)
Admission: EM | Admit: 2014-08-30 | Discharge: 2014-08-30 | Disposition: A | Discharge status: Home / Self Care | Payer: No Typology Code available for payment source | Attending: Emergency Medicine | Admitting: Emergency Medicine

## 2014-08-30 ENCOUNTER — Encounter (HOSPITAL_COMMUNITY): Payer: Self-pay | Admitting: *Deleted

## 2014-08-30 DIAGNOSIS — Z791 Long term (current) use of non-steroidal anti-inflammatories (NSAID): Secondary | ICD-10-CM | POA: Insufficient documentation

## 2014-08-30 DIAGNOSIS — Z79899 Other long term (current) drug therapy: Secondary | ICD-10-CM | POA: Insufficient documentation

## 2014-08-30 DIAGNOSIS — Z8742 Personal history of other diseases of the female genital tract: Secondary | ICD-10-CM | POA: Insufficient documentation

## 2014-08-30 DIAGNOSIS — Z880 Allergy status to penicillin: Secondary | ICD-10-CM | POA: Insufficient documentation

## 2014-08-30 DIAGNOSIS — M79642 Pain in left hand: Secondary | ICD-10-CM | POA: Insufficient documentation

## 2014-08-30 DIAGNOSIS — M79643 Pain in unspecified hand: Secondary | ICD-10-CM

## 2014-08-30 DIAGNOSIS — Z87891 Personal history of nicotine dependence: Secondary | ICD-10-CM | POA: Insufficient documentation

## 2014-08-30 DIAGNOSIS — E079 Disorder of thyroid, unspecified: Secondary | ICD-10-CM | POA: Insufficient documentation

## 2014-08-30 DIAGNOSIS — F329 Major depressive disorder, single episode, unspecified: Secondary | ICD-10-CM | POA: Insufficient documentation

## 2014-08-30 DIAGNOSIS — M069 Rheumatoid arthritis, unspecified: Secondary | ICD-10-CM | POA: Insufficient documentation

## 2014-08-30 DIAGNOSIS — F419 Anxiety disorder, unspecified: Secondary | ICD-10-CM | POA: Insufficient documentation

## 2014-08-30 DIAGNOSIS — M79641 Pain in right hand: Secondary | ICD-10-CM | POA: Insufficient documentation

## 2014-08-30 MED ORDER — PREDNISONE 10 MG PO TABS: ORAL_TABLET | ORAL | Status: DC

## 2014-08-30 MED ORDER — TRAMADOL HCL 50 MG PO TABS
50.0000 mg | ORAL_TABLET | Freq: Four times a day (QID) | ORAL | Status: DC | PRN
Start: 2014-08-30 — End: 2014-10-26

## 2014-08-30 NOTE — ED Notes (Signed)
Pt states she has an appointment on 12/8 w/ rheumatologist for her arthritis in her hands, states she is out of her medications and she needs her medications refilled to last her until she goes on the 8th, pt states having hand pain.

## 2014-08-30 NOTE — Discharge Instructions (Signed)

## 2014-08-30 NOTE — ED Provider Notes (Signed)
CSN: [id number]     Arrival date & time 08/30/14  1844 History  This chart was scribed for non-physician practitioner working with Purvis Sheffield, MD by Richarda Overlie, ED Scribe. This patient was seen in room WTR6/WTR6 and the patient's care was started at 7:49 PM.    Chief Complaint  Patient presents with  . Hand Pain   HPI HPI Comments: Dana Elliott is a 33 y.o. female with a history of rheumatoid arthritis who presents to the Emergency Department complaining of bilateral hand pain. Per nursing, pt states she has an appointment on 12/8 with rheumatologist for her arthritis in her hands, she states she is out of her medications and she needs her medications refilled to last her until she goes to her appointment on the 8th. She states she has tried prednisone, percocet, meloxicam and other medications and states she did not like any of these medications. Pt is asking for prednisone and tramadol medication. She reports her pain worsens with certain movements.   Past Medical History  Diagnosis Date  . Thyroid disease   . Abnormal Pap smear   . Allergy   . Depression   . Rheumatoid arthritis(714.0)   . Anxiety   . Ovarian cyst   . PID (acute pelvic inflammatory disease)    Past Surgical History  Procedure Laterality Date  . Tubal ligation    . Laparoscopic tubal ligation     Family History  Problem Relation Age of Onset  . Diabetes Mother   . Hypertension Mother   . Alcohol abuse Paternal Grandmother   . Hypothyroidism Paternal Grandfather   . Other Neg Hx    History  Substance Use Topics  . Smoking status: Former Smoker    Quit date: 03/16/2013  . Smokeless tobacco: Never Used  . Alcohol Use: No   OB History    Gravida Para Term Preterm AB TAB SAB Ectopic Multiple Living   4 2 1 1 2 2    2      Review of Systems  Musculoskeletal: Positive for joint swelling and arthralgias.  All other systems reviewed and are negative.   Allergies  Penicillins; Tramadol; Reglan;  and Toradol  Home Medications   Prior to Admission medications   Medication Sig Start Date End Date Taking? Authorizing Provider  ferrous sulfate 325 (65 FE) MG tablet Take 1 tablet (325 mg total) by mouth 2 (two) times daily before a meal. For anemia 03/21/14   03/23/14, NP  FLUoxetine (PROZAC) 20 MG capsule Take 1 capsule (20 mg total) by mouth at bedtime. For depression 03/21/14   03/23/14, NP  HYDROcodone-acetaminophen (NORCO) 5-325 MG per tablet Take 1 tablet by mouth every 6 (six) hours as needed for moderate pain. 07/20/14   07/22/14, MD  hydrOXYzine (ATARAX/VISTARIL) 25 MG tablet Take 1 tablet (25 mg) three times daily as needed: For anxiety 03/21/14   03/23/14, NP  levothyroxine (SYNTHROID, LEVOTHROID) 175 MCG tablet Take 1 tablet (175 mcg total) by mouth daily. For low thyroid function 03/21/14   03/23/14, NP  meloxicam (MOBIC) 7.5 MG tablet Take 1 tablet (7.5 mg total) by mouth daily. 04/14/14   Shari A Upstill, PA-C  ondansetron (ZOFRAN ODT) 4 MG disintegrating tablet Take 1 tablet (4 mg total) by mouth every 8 (eight) hours as needed for nausea or vomiting. 04/28/14   06/28/14 Molpus, MD  oxyCODONE-acetaminophen (PERCOCET) 5-325 MG per tablet Take 1 tablet by mouth every 6 (six) hours  as needed for moderate pain. 05/24/14   Purvis Sheffield, MD  predniSONE (DELTASONE) 10 MG tablet Take as a 6 day taper. 6 on day one. 5 on day 2. 4 on day 3. 3 on day 4. 2 on day 5. 1 on day 6. 05/24/14   Purvis Sheffield, MD  predniSONE (DELTASONE) 10 MG tablet Take as a 6 day taper. 6 on day one. 5 on day 2. 4 on day 3. 3 on day 4. 2 on day 5. 1 on day 6. 07/20/14   Purvis Sheffield, MD  zolpidem (AMBIEN) 5 MG tablet Take 1 tablet (5 mg total) by mouth at bedtime as needed for sleep. 03/21/14   Sanjuana Kava, NP   BP 135/81 mmHg  Pulse 119  Temp(Src) 98 F (36.7 C) (Oral)  Resp 18  Ht 5\' 6"  (1.676 m)  Wt 189 lb (85.73 kg)  BMI 30.52 kg/m2  SpO2 99%  LMP 08/28/2014 Physical  Exam  Constitutional: She is oriented to person, place, and time. She appears well-developed and well-nourished.  HENT:  Head: Normocephalic and atraumatic.  Neck: Neck supple. No tracheal deviation present.  Cardiovascular: Normal rate.   Pulmonary/Chest: Effort normal. No respiratory distress.  Abdominal: She exhibits no distension.  Musculoskeletal:  Knuckles are swollen, minimal deformities.   Neurological: She is alert and oriented to person, place, and time.  Skin: Skin is warm and dry.  Psychiatric: She has a normal mood and affect. Her behavior is normal.  Nursing note and vitals reviewed.   ED Course  Procedures  DIAGNOSTIC STUDIES: Oxygen Saturation is 99% on RA, normal by my interpretation.    COORDINATION OF CARE: 7:55 PM Discussed treatment plan with pt at bedside and pt agreed to plan.   Labs Review Labs Reviewed - No data to display  Imaging Review No results found.   EKG Interpretation None      MDM   Final diagnoses:  Hand joint pain, unspecified laterality    Tramadol Prednisone taper See your Physician for recheck   I personally performed the services in this documentation, which was scribed in my presence.  The recorded information has been reviewed and considered.   14/09/2013.  Barnet Pall, PA-C 08/30/14 1958  14/03/15 [place], PA-C 08/30/14 1959  14/03/15, MD 08/31/14 [phone]

## 2014-09-22 ENCOUNTER — Emergency Department (HOSPITAL_BASED_OUTPATIENT_CLINIC_OR_DEPARTMENT_OTHER): Payer: Self-pay

## 2014-09-22 ENCOUNTER — Encounter (HOSPITAL_BASED_OUTPATIENT_CLINIC_OR_DEPARTMENT_OTHER): Payer: Self-pay | Admitting: Emergency Medicine

## 2014-09-22 DIAGNOSIS — S034XXA Sprain of jaw, initial encounter: Secondary | ICD-10-CM | POA: Insufficient documentation

## 2014-09-22 DIAGNOSIS — F419 Anxiety disorder, unspecified: Secondary | ICD-10-CM | POA: Insufficient documentation

## 2014-09-22 DIAGNOSIS — E079 Disorder of thyroid, unspecified: Secondary | ICD-10-CM | POA: Insufficient documentation

## 2014-09-22 DIAGNOSIS — W541XXA Struck by dog, initial encounter: Secondary | ICD-10-CM | POA: Insufficient documentation

## 2014-09-22 DIAGNOSIS — Y92238 Other place in hospital as the place of occurrence of the external cause: Secondary | ICD-10-CM | POA: Insufficient documentation

## 2014-09-22 DIAGNOSIS — Z8742 Personal history of other diseases of the female genital tract: Secondary | ICD-10-CM | POA: Insufficient documentation

## 2014-09-22 DIAGNOSIS — Z79899 Other long term (current) drug therapy: Secondary | ICD-10-CM | POA: Insufficient documentation

## 2014-09-22 DIAGNOSIS — Y990 Civilian activity done for income or pay: Secondary | ICD-10-CM | POA: Insufficient documentation

## 2014-09-22 DIAGNOSIS — Z87891 Personal history of nicotine dependence: Secondary | ICD-10-CM | POA: Insufficient documentation

## 2014-09-22 DIAGNOSIS — Z791 Long term (current) use of non-steroidal anti-inflammatories (NSAID): Secondary | ICD-10-CM | POA: Insufficient documentation

## 2014-09-22 DIAGNOSIS — F329 Major depressive disorder, single episode, unspecified: Secondary | ICD-10-CM | POA: Insufficient documentation

## 2014-09-22 DIAGNOSIS — Y9389 Activity, other specified: Secondary | ICD-10-CM | POA: Insufficient documentation

## 2014-09-22 DIAGNOSIS — M069 Rheumatoid arthritis, unspecified: Secondary | ICD-10-CM | POA: Insufficient documentation

## 2014-09-22 NOTE — ED Notes (Signed)
Pt presents to ED with complaints of left jaw pain and headache after being headbutted by a dog while she was restraining a dog at work.

## 2014-09-23 ENCOUNTER — Emergency Department (HOSPITAL_BASED_OUTPATIENT_CLINIC_OR_DEPARTMENT_OTHER)
Admission: EM | Admit: 2014-09-23 | Discharge: 2014-09-23 | Disposition: A | Discharge status: Home / Self Care | Payer: Medicaid Other | Attending: Emergency Medicine | Admitting: Emergency Medicine

## 2014-09-23 ENCOUNTER — Emergency Department (HOSPITAL_BASED_OUTPATIENT_CLINIC_OR_DEPARTMENT_OTHER): Payer: Medicaid Other

## 2014-09-23 DIAGNOSIS — S0340XA Sprain of jaw, unspecified side, initial encounter: Secondary | ICD-10-CM

## 2014-09-23 DIAGNOSIS — R52 Pain, unspecified: Secondary | ICD-10-CM

## 2014-09-23 MED ORDER — OXYCODONE-ACETAMINOPHEN 5-325 MG PO TABS
1.0000 | ORAL_TABLET | Freq: Once | ORAL | Status: AC
  Administered 2014-09-23: 1 via ORAL
  Filled 2014-09-23: qty 1

## 2014-09-23 MED ORDER — OXYCODONE-ACETAMINOPHEN 5-325 MG PO TABS: 1.0000 | ORAL_TABLET | Freq: Four times a day (QID) | ORAL | Status: DC | PRN

## 2014-09-23 MED ORDER — HYDROCODONE-ACETAMINOPHEN 5-325 MG PO TABS: 1.0000 | ORAL_TABLET | Freq: Once | ORAL | Status: DC

## 2014-09-23 MED ORDER — DIAZEPAM 5 MG PO TABS: 5.0000 mg | ORAL_TABLET | Freq: Three times a day (TID) | ORAL | Status: DC | PRN

## 2014-09-23 NOTE — ED Provider Notes (Signed)
CSN: [id number]     Arrival date & time 09/22/14  2207 History   First MD Initiated Contact with Patient 09/23/14 0119     Chief Complaint  Patient presents with  . 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 The dog got startled and struck the left side of her face with its head. About an hour later she developed pain in her left TMJ and left mandibular angle. She is having moderate pain at rest and worse pain when she opens her mouth widely or when she attempts to chew. She feels as if her teeth are not aligning properly. She denies any loose teeth. She is also having a headache. She is able to talk without difficulty. She denies neck pain or other injury.  Past Medical History  Diagnosis Date  . Thyroid disease   . Abnormal Pap smear   . Allergy   . Depression   . Rheumatoid arthritis(714.0)   . Anxiety   . Ovarian cyst   . PID (acute pelvic inflammatory disease)    Past Surgical History  Procedure Laterality Date  . Tubal ligation    . Laparoscopic tubal ligation     Family History  Problem Relation Age of Onset  . Diabetes Mother   . Hypertension Mother   . Alcohol abuse Paternal Grandmother   . Hypothyroidism Paternal Grandfather   . Other Neg Hx    History  Substance Use Topics  . Smoking status: Former Smoker    Quit date: 03/16/2013  . Smokeless tobacco: Never Used  . Alcohol Use: No   OB History    Gravida Para Term Preterm AB TAB SAB Ectopic Multiple Living   4 2 1 1 2 2    2      Review of Systems  All other systems reviewed and are negative.   Allergies  Penicillins; Tramadol; Reglan; and Toradol  Home Medications   Prior to Admission medications   Medication Sig Start Date End Date Taking? Authorizing Provider  ferrous sulfate 325 (65 FE) MG tablet Take 1  tablet (325 mg total) by mouth 2 (two) times daily before a meal. For anemia 03/21/14   03/23/14, NP  FLUoxetine (PROZAC) 20 MG capsule Take 1 capsule (20 mg total) by mouth at bedtime. For depression 03/21/14   03/23/14, NP  HYDROcodone-acetaminophen (NORCO) 5-325 MG per tablet Take 1 tablet by mouth every 6 (six) hours as needed for moderate pain. 07/20/14   07/22/14, MD  hydrOXYzine (ATARAX/VISTARIL) 25 MG tablet Take 1 tablet (25 mg) three times daily as needed: For anxiety 03/21/14   03/23/14, NP  levothyroxine (SYNTHROID, LEVOTHROID) 175 MCG tablet Take 1 tablet (175 mcg total) by mouth daily. For low thyroid function 03/21/14   03/23/14, NP  meloxicam (MOBIC) 7.5 MG tablet Take 1 tablet (7.5 mg total) by mouth daily. 04/14/14   Shari A Upstill, PA-C  ondansetron (ZOFRAN ODT) 4 MG disintegrating tablet Take 1 tablet (4 mg total) by mouth every 8 (eight) hours as needed for nausea or vomiting. 04/28/14   06/28/14 Toddy Boyd, MD  oxyCODONE-acetaminophen (PERCOCET) 5-325 MG per tablet Take 1 tablet by mouth every 6 (six) hours as needed for moderate pain. 05/24/14   05/26/14, MD  predniSONE (DELTASONE) 10 MG tablet Take as a 6 day taper. 6  on day one. 5 on day 2. 4 on day 3. 3 on day 4. 2 on day 5. 1 on day 6. 08/30/14   Elson Areas, PA-C  traMADol (ULTRAM) 50 MG tablet Take 1 tablet (50 mg total) by mouth every 6 (six) hours as needed. 08/30/14   Elson Areas, PA-C  zolpidem (AMBIEN) 5 MG tablet Take 1 tablet (5 mg total) by mouth at bedtime as needed for sleep. 03/21/14   Sanjuana Kava, NP   BP 137/89 mmHg  Pulse 95  Temp(Src) 98.3 F (36.8 C) (Oral)  Resp 20  Ht 5\' 6"  (1.676 m)  Wt 180 lb (81.647 kg)  BMI 29.07 kg/m2  SpO2 100%  LMP 08/28/2014   Physical Exam  General: Well-developed, well-nourished female in no acute distress; appearance consistent with age of record HENT: normocephalic; tenderness of left TMJ and mandibular angle without bony deformity,  swelling or ecchymosis; TMs normal Eyes: pupils equal, round and reactive to light; extraocular muscles intact Neck: supple; nontender Heart: regular rate and rhythm Lungs: clear to auscultation bilaterally Abdomen: soft; nondistended; nontender; no masses or hepatosplenomegaly; bowel sounds present Extremities: No deformity; full range of motion; pulses normal Neurologic: Awake, alert and oriented; motor function intact in all extremities and symmetric; no facial droop Skin: Warm and dry Psychiatric: Flat affect    ED Course  Procedures (including critical care time)   MDM  Nursing notes and vitals signs, including pulse oximetry, reviewed.  Summary of this visit's results, reviewed by myself:  Imaging Studies: Dg Facial Bones Complete  09/22/2014   CLINICAL DATA:  Headbutted by a dog at work. Sensation of left temporomandibular joint dysfunction. Initial encounter.  EXAM: FACIAL BONES COMPLETE 3+V  COMPARISON:  CT of the head performed 04/28/2014  FINDINGS: There is no evidence of dislocation at the temporomandibular joints. A single [place] view does demonstrate questionable lucency at the left mandibular condyle just below the condylar head, of uncertain significance. If the patient's symptoms persist, further imaging could be considered to exclude a nondisplaced fracture.  The bony orbits are grossly unremarkable. The visualized paranasal sinuses and mastoid air cells are well aerated.  IMPRESSION: No evidence of dislocation of the temporomandibular joints. Questionable lucency noted at the left mandibular condyle just below the condylar head on a single view, of uncertain significance. If the patient's symptoms persist, further imaging could be considered to exclude a nondisplaced fracture.   Electronically Signed   By: 06/28/2014 M.D.   On: 09/22/2014 23:12   Ct Maxillofacial Wo Cm  09/23/2014   CLINICAL DATA:  LEFT jaw pain and headache after being headbutted by a dog at work.   EXAM: CT MAXILLOFACIAL WITHOUT CONTRAST  TECHNIQUE: Multidetector CT imaging of the maxillofacial structures was performed. Multiplanar CT image reconstructions were also generated. A small metallic BB was placed on the right temple in order to reliably differentiate right from left.  COMPARISON:  Facial radiographs September 22, 2014 at 2239 hr  FINDINGS: The mandible is intact, the condyles are located. No acute facial fracture.  Paranasal sinuses are well aerated. Nasal septum is midline. No destructive bony lesions.  Ocular globes and orbital contents are unremarkable. Soft tissues are nonsuspicious. Tonsilloliths.  IMPRESSION: No acute facial fracture with particular attention to the LEFT mandible condyle.   Electronically Signed   By: [id number]   On: 09/23/2014 02:27       09/25/2014, MD 09/23/14 [phone]

## 2014-10-05 ENCOUNTER — Encounter (HOSPITAL_BASED_OUTPATIENT_CLINIC_OR_DEPARTMENT_OTHER): Payer: Self-pay | Admitting: Emergency Medicine

## 2014-10-05 ENCOUNTER — Emergency Department (HOSPITAL_BASED_OUTPATIENT_CLINIC_OR_DEPARTMENT_OTHER)
Admission: EM | Admit: 2014-10-05 | Discharge: 2014-10-06 | Disposition: A | Discharge status: Home / Self Care | Payer: No Typology Code available for payment source | Attending: Emergency Medicine | Admitting: Emergency Medicine

## 2014-10-05 DIAGNOSIS — M26629 Arthralgia of temporomandibular joint, unspecified side: Secondary | ICD-10-CM

## 2014-10-05 DIAGNOSIS — Z791 Long term (current) use of non-steroidal anti-inflammatories (NSAID): Secondary | ICD-10-CM | POA: Insufficient documentation

## 2014-10-05 DIAGNOSIS — Z7952 Long term (current) use of systemic steroids: Secondary | ICD-10-CM | POA: Insufficient documentation

## 2014-10-05 DIAGNOSIS — M2662 Arthralgia of temporomandibular joint: Secondary | ICD-10-CM | POA: Insufficient documentation

## 2014-10-05 DIAGNOSIS — E079 Disorder of thyroid, unspecified: Secondary | ICD-10-CM | POA: Insufficient documentation

## 2014-10-05 DIAGNOSIS — Z79899 Other long term (current) drug therapy: Secondary | ICD-10-CM | POA: Insufficient documentation

## 2014-10-05 DIAGNOSIS — F329 Major depressive disorder, single episode, unspecified: Secondary | ICD-10-CM | POA: Insufficient documentation

## 2014-10-05 DIAGNOSIS — M069 Rheumatoid arthritis, unspecified: Secondary | ICD-10-CM | POA: Insufficient documentation

## 2014-10-05 DIAGNOSIS — F419 Anxiety disorder, unspecified: Secondary | ICD-10-CM | POA: Insufficient documentation

## 2014-10-05 DIAGNOSIS — Z8742 Personal history of other diseases of the female genital tract: Secondary | ICD-10-CM | POA: Insufficient documentation

## 2014-10-05 DIAGNOSIS — Z87891 Personal history of nicotine dependence: Secondary | ICD-10-CM | POA: Insufficient documentation

## 2014-10-05 NOTE — ED Notes (Signed)
Pt reports that she was seen and evaluated her after injury at work, she had no fx but was diagosed with a maxofacial injury, pt has completed course of medication prescribed here, but can not get in to [facility] surgeon for another 2 weeks, once she stopped the medication she states she was unable to open jaw, pt very talkative in triage in nad

## 2014-10-06 MED ORDER — DIAZEPAM 5 MG PO TABS: 5.0000 mg | ORAL_TABLET | Freq: Four times a day (QID) | ORAL | Status: DC | PRN

## 2014-10-06 MED ORDER — OXYCODONE-ACETAMINOPHEN 5-325 MG PO TABS: 1.0000 | ORAL_TABLET | Freq: Four times a day (QID) | ORAL | Status: DC | PRN

## 2014-10-06 NOTE — ED Provider Notes (Signed)
CSN: [id number]     Arrival date & time 10/05/14  2156 History   First MD Initiated Contact with Patient 10/06/14 0024     Chief Complaint  Patient presents with  . Medication Refill     (Consider location/radiation/quality/duration/timing/severity/associated sxs/prior Treatment) HPI Comments: Patient is a 34 year old female who presents with left jaw pain. She was seen here 2 weeks ago after being head butted by an 80 pound dog at work. She had a CT scan performed at that time which did not reveal any fracture or acute abnormality. She was treated with pain medication and Valium and this helped her symptoms. She states that her pain returned this evening in the absence of any new injury or trauma. She states it hurts to open or close her mouth, and to eat or chew. She denies any fevers or chills. She tells me she has a follow-up appointment in 1 week with oral surgery.  The history is provided by the patient.    Past Medical History  Diagnosis Date  . Thyroid disease   . Abnormal Pap smear   . Allergy   . Depression   . Rheumatoid arthritis(714.0)   . Anxiety   . Ovarian cyst   . PID (acute pelvic inflammatory disease)    Past Surgical History  Procedure Laterality Date  . Tubal ligation    . Laparoscopic tubal ligation     Family History  Problem Relation Age of Onset  . Diabetes Mother   . Hypertension Mother   . Alcohol abuse Paternal Grandmother   . Hypothyroidism Paternal Grandfather   . Other Neg Hx    History  Substance Use Topics  . Smoking status: Former Smoker    Quit date: 03/16/2013  . Smokeless tobacco: Never Used  . Alcohol Use: No   OB History    Gravida Para Term Preterm AB TAB SAB Ectopic Multiple Living   4 2 1 1 2 2    2      Review of Systems  All other systems reviewed and are negative.     Allergies  Penicillins; Tramadol; Reglan; and Toradol  Home Medications   Prior to Admission medications   Medication Sig Start Date End Date  Taking? Authorizing Provider  levothyroxine (SYNTHROID, LEVOTHROID) 175 MCG tablet Take 1 tablet (175 mcg total) by mouth daily. For low thyroid function 03/21/14  Yes 03/23/14, NP  diazepam (VALIUM) 5 MG tablet Take 1 tablet (5 mg total) by mouth every 6 (six) hours as needed for anxiety (spasms). 10/06/14   12/05/14, MD  ferrous sulfate 325 (65 FE) MG tablet Take 1 tablet (325 mg total) by mouth 2 (two) times daily before a meal. For anemia 03/21/14   03/23/14, NP  FLUoxetine (PROZAC) 20 MG capsule Take 1 capsule (20 mg total) by mouth at bedtime. For depression 03/21/14   03/23/14, NP  HYDROcodone-acetaminophen (NORCO) 5-325 MG per tablet Take 1 tablet by mouth every 6 (six) hours as needed for moderate pain. 07/20/14   07/22/14, MD  hydrOXYzine (ATARAX/VISTARIL) 25 MG tablet Take 1 tablet (25 mg) three times daily as needed: For anxiety 03/21/14   03/23/14, NP  meloxicam (MOBIC) 7.5 MG tablet Take 1 tablet (7.5 mg total) by mouth daily. 04/14/14   Shari A Upstill, PA-C  ondansetron (ZOFRAN ODT) 4 MG disintegrating tablet Take 1 tablet (4 mg total) by mouth every 8 (eight) hours as needed for nausea or vomiting. 04/28/14  Carlisle Beers Molpus, MD  oxyCODONE-acetaminophen (PERCOCET) 5-325 MG per tablet Take 1-2 tablets by mouth every 6 (six) hours as needed (for pain). 09/23/14   Carlisle Beers Molpus, MD  oxyCODONE-acetaminophen (PERCOCET) 5-325 MG per tablet Take 1-2 tablets by mouth every 6 (six) hours as needed. 10/06/14   Geoffery Lyons, MD  predniSONE (DELTASONE) 10 MG tablet Take as a 6 day taper. 6 on day one. 5 on day 2. 4 on day 3. 3 on day 4. 2 on day 5. 1 on day 6. 08/30/14   Elson Areas, PA-C  traMADol (ULTRAM) 50 MG tablet Take 1 tablet (50 mg total) by mouth every 6 (six) hours as needed. 08/30/14   Elson Areas, PA-C  zolpidem (AMBIEN) 5 MG tablet Take 1 tablet (5 mg total) by mouth at bedtime as needed for sleep. 03/21/14   Sanjuana Kava, NP   BP 129/95 mmHg  Pulse 102   Temp(Src) 98 F (36.7 C) (Oral)  Resp 18  Ht 5\' 6"  (1.676 m)  Wt 180 lb (81.647 kg)  BMI 29.07 kg/m2  SpO2 100% Physical Exam  Constitutional: She is oriented to person, place, and time. She appears well-developed and well-nourished. No distress.  HENT:  Head: Normocephalic and atraumatic.  Oropharynx appears clear and moist. There is no obvious abnormality. She has pain with opening her mouth. There is no click in the TMJ or crepitus.  Neck: Normal range of motion. Neck supple.  Neurological: She is alert and oriented to person, place, and time.  Skin: Skin is warm and dry. She is not diaphoretic.  Nursing note and vitals reviewed.   ED Course  Procedures (including critical care time) Labs Review Labs Reviewed - No data to display  Imaging Review No results found.   EKG Interpretation None      MDM   Final diagnoses:  Temporomandibular joint (TMJ) pain    Patient presents with complaints of jaw pain that I suspect is TMJ related. She has difficulty with opening and closing her mouth, however she has no difficulty with speaking, making her exam somewhat inconsistent. She is requesting a refill of Percocet and Valium to help her until she can see her oral surgeon she tells me she has an appointment with. I have agreed to write a small quantity of these for her, however these medications will not be refilled in the ER again. She is to follow-up with her primary Dr. or oral surgeon for this.    , MD 10/06/14 [phone]

## 2014-10-06 NOTE — ED Notes (Signed)
Patient d/c'd to home and made aware that she needs to follow up with another doctor if she needs another refill of medications.

## 2014-10-06 NOTE — ED Notes (Signed)
Patient very talkative with no distress noted, or grimicing with movement of jaw.

## 2014-10-06 NOTE — Discharge Instructions (Signed)
Ibuprofen 600 mg 3 times daily.  Percocet as needed for pain not relieved with ibuprofen.   Temporomandibular Problems  Temporomandibular joint (TMJ) dysfunction means there are problems with the joint between your jaw and your skull. This is a joint lined by cartilage like other joints in your body but also has a small disc in the joint which keeps the bones from rubbing on each other. These joints are like other joints and can get inflamed (sore) from arthritis and other problems. When this joint gets sore, it can cause headaches and pain in the jaw and the face. CAUSES  Usually the arthritic types of problems are caused by soreness in the joint. Soreness in the joint can also be caused by overuse. This may come from grinding your teeth. It may also come from mis-alignment in the joint. DIAGNOSIS Diagnosis of this condition can often be made by history and exam. Sometimes your caregiver may need X-rays or an MRI scan to determine the exact cause. It may be necessary to see your dentist to determine if your teeth and jaws are lined up correctly. TREATMENT  Most of the time this problem is not serious; however, sometimes it can persist (become chronic). When this happens medications that will cut down on inflammation (soreness) help. Sometimes a shot of cortisone into the joint will be helpful. If your teeth are not aligned it may help for your dentist to make a splint for your mouth that can help this problem. If no physical problems can be found, the problem may come from tension. If tension is found to be the cause, biofeedback or relaxation techniques may be helpful. HOME CARE INSTRUCTIONS   Later in the day, applications of ice packs may be helpful. Ice can be used in a plastic bag with a towel around it to prevent frostbite to skin. This may be used about every 2 hours for 20 to 30 minutes, as needed while awake, or as directed by your caregiver.  Only take over-the-counter or prescription  medicines for pain, discomfort, or fever as directed by your caregiver.  If physical therapy was prescribed, follow your caregiver's directions.  Wear mouth appliances as directed if they were given. Document Released: 06/09/2001 Document Revised: 12/07/2011 Document Reviewed: 09/16/2008 [facility] Patient Information 2015 [place], [place]. This information is not intended to replace advice given to you by your health care provider. Make sure you discuss any questions you have with your health care provider.

## 2014-10-26 ENCOUNTER — Emergency Department (HOSPITAL_BASED_OUTPATIENT_CLINIC_OR_DEPARTMENT_OTHER)
Admission: EM | Admit: 2014-10-26 | Discharge: 2014-10-27 | Disposition: A | Discharge status: Home / Self Care | Payer: Self-pay | Attending: Emergency Medicine | Admitting: Emergency Medicine

## 2014-10-26 ENCOUNTER — Encounter (HOSPITAL_BASED_OUTPATIENT_CLINIC_OR_DEPARTMENT_OTHER): Payer: Self-pay | Admitting: Emergency Medicine

## 2014-10-26 ENCOUNTER — Emergency Department (HOSPITAL_BASED_OUTPATIENT_CLINIC_OR_DEPARTMENT_OTHER): Payer: Medicaid Other

## 2014-10-26 DIAGNOSIS — F419 Anxiety disorder, unspecified: Secondary | ICD-10-CM | POA: Insufficient documentation

## 2014-10-26 DIAGNOSIS — T148XXA Other injury of unspecified body region, initial encounter: Secondary | ICD-10-CM

## 2014-10-26 DIAGNOSIS — Y288XXA Contact with other sharp object, undetermined intent, initial encounter: Secondary | ICD-10-CM | POA: Insufficient documentation

## 2014-10-26 DIAGNOSIS — M069 Rheumatoid arthritis, unspecified: Secondary | ICD-10-CM | POA: Insufficient documentation

## 2014-10-26 DIAGNOSIS — E079 Disorder of thyroid, unspecified: Secondary | ICD-10-CM | POA: Insufficient documentation

## 2014-10-26 DIAGNOSIS — Z23 Encounter for immunization: Secondary | ICD-10-CM | POA: Insufficient documentation

## 2014-10-26 DIAGNOSIS — Y998 Other external cause status: Secondary | ICD-10-CM | POA: Insufficient documentation

## 2014-10-26 DIAGNOSIS — Z791 Long term (current) use of non-steroidal anti-inflammatories (NSAID): Secondary | ICD-10-CM | POA: Insufficient documentation

## 2014-10-26 DIAGNOSIS — Z79899 Other long term (current) drug therapy: Secondary | ICD-10-CM | POA: Insufficient documentation

## 2014-10-26 DIAGNOSIS — Z880 Allergy status to penicillin: Secondary | ICD-10-CM | POA: Insufficient documentation

## 2014-10-26 DIAGNOSIS — Y9289 Other specified places as the place of occurrence of the external cause: Secondary | ICD-10-CM | POA: Insufficient documentation

## 2014-10-26 DIAGNOSIS — Z87891 Personal history of nicotine dependence: Secondary | ICD-10-CM | POA: Insufficient documentation

## 2014-10-26 DIAGNOSIS — R000 Tachycardia, unspecified: Secondary | ICD-10-CM | POA: Insufficient documentation

## 2014-10-26 DIAGNOSIS — Z8742 Personal history of other diseases of the female genital tract: Secondary | ICD-10-CM | POA: Insufficient documentation

## 2014-10-26 DIAGNOSIS — F329 Major depressive disorder, single episode, unspecified: Secondary | ICD-10-CM | POA: Insufficient documentation

## 2014-10-26 DIAGNOSIS — S91331A Puncture wound without foreign body, right foot, initial encounter: Secondary | ICD-10-CM | POA: Insufficient documentation

## 2014-10-26 DIAGNOSIS — Y9389 Activity, other specified: Secondary | ICD-10-CM | POA: Insufficient documentation

## 2014-10-26 MED ORDER — TRAMADOL HCL 50 MG PO TABS
50.0000 mg | ORAL_TABLET | Freq: Four times a day (QID) | ORAL | Status: DC | PRN
Start: 2014-10-26 — End: 2014-10-26

## 2014-10-26 MED ORDER — IBUPROFEN 800 MG PO TABS: 800.0000 mg | ORAL_TABLET | Freq: Once | ORAL | Status: DC

## 2014-10-26 MED ORDER — TETANUS-DIPHTH-ACELL PERTUSSIS 5-2.5-18.5 LF-MCG/0.5 IM SUSP
0.5000 mL | Freq: Once | INTRAMUSCULAR | Status: AC
  Administered 2014-10-26: 0.5 mL via INTRAMUSCULAR
  Filled 2014-10-26: qty 0.5

## 2014-10-26 MED ORDER — TRAMADOL HCL 50 MG PO TABS: 50.0000 mg | ORAL_TABLET | Freq: Four times a day (QID) | ORAL | Status: DC | PRN

## 2014-10-26 MED ORDER — HYDROCODONE-ACETAMINOPHEN 5-325 MG PO TABS
1.0000 | ORAL_TABLET | Freq: Once | ORAL | Status: AC
  Administered 2014-10-26: 1 via ORAL
  Filled 2014-10-26: qty 1

## 2014-10-26 NOTE — ED Notes (Signed)
States she cannot take the DT with out sedation medication. Refused Motrin or Tylenol.  Vicodin was given and she states in 20 minutes she will take the DT

## 2014-10-26 NOTE — ED Provider Notes (Signed)
CSN: [id number]     Arrival date & time 10/26/14  2134 History  This chart was scribed for Glynn Octave, MD by Abel Presto, ED Scribe. This patient was seen in room MH07/MH07 and the patient's care was started at 10:13 PM.    Chief Complaint  Patient presents with  . Puncture Wound    The history is provided by the patient. No language interpreter was used.   HPI Comments: Dana Elliott is a 34 y.o. female with PMHx of thyroid, rheumatoid arthritis, anxiety, and PID who presents to the Emergency Department complaining of 2 puncture wounds to right foot with onset 5 days ago.  Pt states she stepped back onto a wire fence at onset.  Pt denies rust on fence. She states she thought it was an abrasion at first but later noticed the puncture. She states she has been cleaning if efficiently. Pt notes she stood on her foot for a long time today and notes associated achy pain, numbness, edema, and some drainage. Pt states pain is not reproducible. Pt states she is utd on her tetanus. Pt has been taking Tramadol. Pt is allergic to penicillins. Pt denies chest pain, fever, and SOB.   Past Medical History  Diagnosis Date  . Thyroid disease   . Abnormal Pap smear   . Allergy   . Depression   . Rheumatoid arthritis(714.0)   . Anxiety   . Ovarian cyst   . PID (acute pelvic inflammatory disease)    Past Surgical History  Procedure Laterality Date  . Tubal ligation    . Laparoscopic tubal ligation     Family History  Problem Relation Age of Onset  . Diabetes Mother   . Hypertension Mother   . Alcohol abuse Paternal Grandmother   . Hypothyroidism Paternal Grandfather   . Other Neg Hx    History  Substance Use Topics  . Smoking status: Former Smoker    Quit date: 03/16/2013  . Smokeless tobacco: Never Used  . Alcohol Use: No   OB History    Gravida Para Term Preterm AB TAB SAB Ectopic Multiple Living   4 2 1 1 2 2    2      Review of Systems A complete 10 system review of systems  was obtained and all systems are negative except as noted in the HPI and PMH.     Allergies  Penicillins; Tramadol; Reglan; and Toradol  Home Medications   Prior to Admission medications   Medication Sig Start Date End Date Taking? Authorizing Provider  diazepam (VALIUM) 5 MG tablet Take 1 tablet (5 mg total) by mouth every 6 (six) hours as needed for anxiety (spasms). 10/06/14   12/05/14, MD  ferrous sulfate 325 (65 FE) MG tablet Take 1 tablet (325 mg total) by mouth 2 (two) times daily before a meal. For anemia 03/21/14   03/23/14, NP  FLUoxetine (PROZAC) 20 MG capsule Take 1 capsule (20 mg total) by mouth at bedtime. For depression 03/21/14   03/23/14, NP  HYDROcodone-acetaminophen (NORCO) 5-325 MG per tablet Take 1 tablet by mouth every 6 (six) hours as needed for moderate pain. 07/20/14   07/22/14, MD  hydrOXYzine (ATARAX/VISTARIL) 25 MG tablet Take 1 tablet (25 mg) three times daily as needed: For anxiety 03/21/14   03/23/14, NP  levothyroxine (SYNTHROID, LEVOTHROID) 175 MCG tablet Take 1 tablet (175 mcg total) by mouth daily. For low thyroid function 03/21/14   03/23/14, NP  meloxicam (MOBIC) 7.5 MG tablet Take 1 tablet (7.5 mg total) by mouth daily. 04/14/14   Shari A Upstill, PA-C  ondansetron (ZOFRAN ODT) 4 MG disintegrating tablet Take 1 tablet (4 mg total) by mouth every 8 (eight) hours as needed for nausea or vomiting. 04/28/14   Carlisle Beers Molpus, MD  oxyCODONE-acetaminophen (PERCOCET) 5-325 MG per tablet Take 1-2 tablets by mouth every 6 (six) hours as needed (for pain). 09/23/14   Carlisle Beers Molpus, MD  oxyCODONE-acetaminophen (PERCOCET) 5-325 MG per tablet Take 1-2 tablets by mouth every 6 (six) hours as needed. 10/06/14   Geoffery Lyons, MD  predniSONE (DELTASONE) 10 MG tablet Take as a 6 day taper. 6 on day one. 5 on day 2. 4 on day 3. 3 on day 4. 2 on day 5. 1 on day 6. 08/30/14   Elson Areas, PA-C  traMADol (ULTRAM) 50 MG tablet Take 1 tablet (50 mg total) by  mouth every 6 (six) hours as needed. 10/26/14   Glynn Octave, MD  zolpidem (AMBIEN) 5 MG tablet Take 1 tablet (5 mg total) by mouth at bedtime as needed for sleep. 03/21/14   Sanjuana Kava, NP   BP 135/89 mmHg  Pulse 114  Temp(Src) 97.9 F (36.6 C) (Oral)  Resp 20  Ht 5\' 6"  (1.676 m)  Wt 180 lb (81.647 kg)  BMI 29.07 kg/m2  SpO2 100% Physical Exam  Constitutional: She is oriented to person, place, and time. She appears well-developed and well-nourished. No distress.  HENT:  Head: Normocephalic and atraumatic.  Mouth/Throat: Oropharynx is clear and moist. No oropharyngeal exudate.  Eyes: Conjunctivae and EOM are normal. Pupils are equal, round, and reactive to light.  Neck: Normal range of motion. Neck supple.  No meningismus.  Cardiovascular: Normal rate, regular rhythm, normal heart sounds and intact distal pulses.   No murmur heard. Intact DP and PT pusles  Pulmonary/Chest: Effort normal and breath sounds normal. No respiratory distress.  Abdominal: Soft. There is no tenderness. There is no rebound and no guarding.  Musculoskeletal: Normal range of motion. She exhibits no edema or tenderness.  Neurological: She is alert and oriented to person, place, and time. No cranial nerve deficit. She exhibits normal muscle tone. Coordination normal.  No ataxia on finger to nose bilaterally. No pronator drift. 5/5 strength throughout. CN 2-12 intact. Negative Romberg. Equal grip strength. Sensation intact. Gait is normal.   Skin: Skin is warm.     Psychiatric: She has a normal mood and affect. Her behavior is normal.  Nursing note and vitals reviewed.   ED Course  Procedures (including critical care time) DIAGNOSTIC STUDIES: Oxygen Saturation is 100% on room air, normal by my interpretation.    COORDINATION OF CARE: 10:19 PM Discussed treatment plan with patient at beside, the patient agrees with the plan and has no further questions at this time.   Labs Review Labs Reviewed - No  data to display  Imaging Review Venous Img Lower Unilateral Right  10/26/2014   CLINICAL DATA:  Right lower extremity injury 5 days ago. Presence: Lower leg.  EXAM: RIGHT LOWER EXTREMITY VENOUS DOPPLER ULTRASOUND  TECHNIQUE: Gray-scale sonography with graded compression, as well as color Doppler and duplex ultrasound were performed to evaluate the lower extremity deep venous systems from the level of the common femoral vein and including the common femoral, femoral, profunda femoral, popliteal and calf veins including the posterior tibial, peroneal and gastrocnemius veins when visible. The superficial great saphenous vein was also interrogated.  Spectral Doppler was utilized to evaluate flow at rest and with distal augmentation maneuvers in the common femoral, femoral and popliteal veins.  COMPARISON:  None.  FINDINGS: Contralateral Common Femoral Vein: Respiratory phasicity is normal and symmetric with the symptomatic side. No evidence of thrombus. Normal compressibility.  Common Femoral Vein: No evidence of thrombus. Normal compressibility, respiratory phasicity and response to augmentation.  Saphenofemoral Junction: No evidence of thrombus. Normal compressibility and flow on color Doppler imaging.  Profunda Femoral Vein: No evidence of thrombus. Normal compressibility and flow on color Doppler imaging.  Femoral Vein: No evidence of thrombus. Normal compressibility, respiratory phasicity and response to augmentation.  Popliteal Vein: No evidence of thrombus. Normal compressibility, respiratory phasicity and response to augmentation.  Calf Veins: No evidence of thrombus. Normal compressibility and flow on color Doppler imaging.  Superficial Great Saphenous Vein: No evidence of thrombus. Normal compressibility and flow on color Doppler imaging.  Venous Reflux:  None.  Other Findings:  None.  IMPRESSION: No evidence of deep venous thrombosis.   Electronically Signed   By: Elige Ko   On: 10/26/2014 23:34      EKG Interpretation None      MDM   Final diagnoses:  Puncture wound   RLE puncture wounds from metal fence 5 days ago.  Worsening pain and swelling tonight. No fever. No CP or SOB.  Tachycardic and anxious appearing.  Wounds appear clean. No drainage or cellulitis.  Update tetanus. Distal pulses intact.  Continue to keep wounds clean. followup with PCP. Return with fever, redness, drainage or any other concerns.   Glynn Octave, MD 10/27/14 5625

## 2014-10-26 NOTE — Discharge Instructions (Signed)
Puncture Wound °A puncture wound is an injury that extends through all layers of the skin and into the tissue beneath the skin (subcutaneous tissue). Puncture wounds become infected easily because germs often enter the body and go beneath the skin during the injury. Having a deep wound with a small entrance point makes it difficult for your caregiver to adequately clean the wound. This is especially true if you have stepped on a nail and it has passed through a dirty shoe or other situations where the wound is obviously contaminated. °CAUSES  °Many puncture wounds involve glass, nails, splinters, fish hooks, or other objects that enter the skin (foreign bodies). A puncture wound may also be caused by a human bite or animal bite. °DIAGNOSIS  °A puncture wound is usually diagnosed by your history and a physical exam. You may need to have an X-ray or an ultrasound to check for any foreign bodies still in the wound. °TREATMENT  °· Your caregiver will clean the wound as thoroughly as possible. Depending on the location of the wound, a bandage (dressing) may be applied. °· Your caregiver might prescribe antibiotic medicines. °· You may need a follow-up visit to check on your wound. Follow all instructions as directed by your caregiver. °HOME CARE INSTRUCTIONS  °· Change your dressing once per day, or as directed by your caregiver. If the dressing sticks, it may be removed by soaking the area in water. °· If your caregiver has given you follow-up instructions, it is very important that you return for a follow-up appointment. Not following up as directed could result in a chronic or permanent injury, pain, and disability. °· Only take over-the-counter or prescription medicines for pain, discomfort, or fever as directed by your caregiver. °· If you are given antibiotics, take them as directed. Finish them even if you start to feel better. °You may need a tetanus shot if: °· You cannot remember when you had your last tetanus  shot. °· You have never had a tetanus shot. °If you got a tetanus shot, your arm may swell, get red, and feel warm to the touch. This is common and not a problem. If you need a tetanus shot and you choose not to have one, there is a rare chance of getting tetanus. Sickness from tetanus can be serious. °You may need a rabies shot if an animal bite caused your puncture wound. °SEEK MEDICAL CARE IF:  °· You have redness, swelling, or increasing pain in the wound. °· You have red streaks going away from the wound. °· You notice a bad smell coming from the wound or dressing. °· You have yellowish-white fluid (pus) coming from the wound. °· You are treated with an antibiotic for infection, but the infection is not getting better. °· You notice something in the wound, such as rubber from your shoe, cloth, or another object. °· You have a fever. °· You have severe pain. °· You have difficulty breathing. °· You feel dizzy or faint. °· You cannot stop vomiting. °· You lose feeling, develop numbness, or cannot move a limb below the wound. °· Your symptoms worsen. °MAKE SURE YOU: °· Understand these instructions. °· Will watch your condition. °· Will get help right away if you are not doing well or get worse. °Document Released: 06/24/2005 Document Revised: 12/07/2011 Document Reviewed: 03/03/2011 °[employer]® Patient Information ©2015 [employer]. This information is not intended to replace advice given to you by your health care provider. Make sure you discuss any questions you   have with your health care provider. ° °

## 2014-10-26 NOTE — ED Notes (Signed)
Pt sts puncture wound to R lower leg by barbed wire. Has been using triple abx ointment, today has been more painful, two puncture wounds present. No erythema visible in triage. Pt sts edema to leg with numbness.

## 2014-10-27 MED ORDER — CLINDAMYCIN HCL 300 MG PO CAPS: 300.0000 mg | ORAL_CAPSULE | Freq: Three times a day (TID) | ORAL | Status: DC

## 2014-12-09 ENCOUNTER — Encounter (HOSPITAL_BASED_OUTPATIENT_CLINIC_OR_DEPARTMENT_OTHER): Payer: Self-pay

## 2014-12-09 ENCOUNTER — Emergency Department (HOSPITAL_BASED_OUTPATIENT_CLINIC_OR_DEPARTMENT_OTHER)
Admission: EM | Admit: 2014-12-09 | Discharge: 2014-12-09 | Disposition: A | Discharge status: Home / Self Care | Payer: Medicaid Other | Attending: Emergency Medicine | Admitting: Emergency Medicine

## 2014-12-09 DIAGNOSIS — Z87448 Personal history of other diseases of urinary system: Secondary | ICD-10-CM | POA: Insufficient documentation

## 2014-12-09 DIAGNOSIS — Z79899 Other long term (current) drug therapy: Secondary | ICD-10-CM | POA: Insufficient documentation

## 2014-12-09 DIAGNOSIS — Z87891 Personal history of nicotine dependence: Secondary | ICD-10-CM | POA: Insufficient documentation

## 2014-12-09 DIAGNOSIS — F419 Anxiety disorder, unspecified: Secondary | ICD-10-CM | POA: Insufficient documentation

## 2014-12-09 DIAGNOSIS — E079 Disorder of thyroid, unspecified: Secondary | ICD-10-CM | POA: Insufficient documentation

## 2014-12-09 DIAGNOSIS — Z7952 Long term (current) use of systemic steroids: Secondary | ICD-10-CM | POA: Insufficient documentation

## 2014-12-09 DIAGNOSIS — H6691 Otitis media, unspecified, right ear: Secondary | ICD-10-CM | POA: Insufficient documentation

## 2014-12-09 DIAGNOSIS — M1990 Unspecified osteoarthritis, unspecified site: Secondary | ICD-10-CM | POA: Insufficient documentation

## 2014-12-09 DIAGNOSIS — Z791 Long term (current) use of non-steroidal anti-inflammatories (NSAID): Secondary | ICD-10-CM | POA: Insufficient documentation

## 2014-12-09 DIAGNOSIS — F329 Major depressive disorder, single episode, unspecified: Secondary | ICD-10-CM | POA: Insufficient documentation

## 2014-12-09 DIAGNOSIS — Z880 Allergy status to penicillin: Secondary | ICD-10-CM | POA: Insufficient documentation

## 2014-12-09 MED ORDER — HYDROCODONE-ACETAMINOPHEN 5-325 MG PO TABS
1.0000 | ORAL_TABLET | Freq: Once | ORAL | Status: AC
  Administered 2014-12-09: 1 via ORAL
  Filled 2014-12-09: qty 1

## 2014-12-09 MED ORDER — ONDANSETRON 4 MG PO TBDP
4.0000 mg | ORAL_TABLET | Freq: Once | ORAL | Status: AC
  Administered 2014-12-09: 4 mg via ORAL
  Filled 2014-12-09: qty 1

## 2014-12-09 MED ORDER — CEFUROXIME AXETIL 500 MG PO TABS
500.0000 mg | ORAL_TABLET | Freq: Two times a day (BID) | ORAL | Status: DC
  Filled 2014-12-09: qty 1

## 2014-12-09 MED ORDER — CEFUROXIME AXETIL 500 MG PO TABS: 500.0000 mg | ORAL_TABLET | Freq: Two times a day (BID) | ORAL | Status: AC

## 2014-12-09 NOTE — Discharge Instructions (Signed)
You have a common ear infection. Will slowly improve with antibiotics Motrin and/or Tylenol for pain. Aches the antibiotics as directed until they are gone in 10 days.  Otitis Media Otitis media is redness, soreness, and puffiness (swelling) in the space just behind your eardrum (middle ear). It may be caused by allergies or infection. It often happens along with a cold. HOME CARE  Take your medicine as told. Finish it even if you start to feel better.  Only take over-the-counter or prescription medicines for pain, discomfort, or fever as told by your doctor.  Follow up with your doctor as told. GET HELP IF:  You have otitis media only in one ear, or bleeding from your nose, or both.  You notice a lump on your neck.  You are not getting better in 3-5 days.  You feel worse instead of better. GET HELP RIGHT AWAY IF:   You have pain that is not helped with medicine.  You have puffiness, redness, or pain around your ear.  You get a stiff neck.  You cannot move part of your face (paralysis).  You notice that the bone behind your ear hurts when you touch it. MAKE SURE YOU:   Understand these instructions.  Will watch your condition.  Will get help right away if you are not doing well or get worse. Document Released: 03/02/2008 Document Revised: 09/19/2013 Document Reviewed: 04/11/2013 [facility] Patient Information 2015 [place], [place]. This information is not intended to replace advice given to you by your health care provider. Make sure you discuss any questions you have with your health care provider.

## 2014-12-09 NOTE — ED Notes (Signed)
D/c home with rx x 1- pt has a ride

## 2014-12-09 NOTE — ED Provider Notes (Signed)
CSN: [id number]     Arrival date & time 12/09/14  1024 History   First MD Initiated Contact with Patient 12/09/14 1035     Chief Complaint  Patient presents with  . ear pain, vomiting     HPI  Symptoms evaluation of ear pain. States she started vomiting this morning. She's had cold symptoms for 3-4 days. Crying anxious and almost hyperventilating upon arrival.  Per her chart has history of anxiety and narcotic use and abuse.  Past Medical History  Diagnosis Date  . Thyroid disease   . Abnormal Pap smear   . Allergy   . Depression   . Rheumatoid arthritis(714.0)   . Anxiety   . Ovarian cyst   . PID (acute pelvic inflammatory disease)    Past Surgical History  Procedure Laterality Date  . Tubal ligation    . Laparoscopic tubal ligation     Family History  Problem Relation Age of Onset  . Diabetes Mother   . Hypertension Mother   . Alcohol abuse Paternal Grandmother   . Hypothyroidism Paternal Grandfather   . Other Neg Hx    History  Substance Use Topics  . Smoking status: Former Smoker    Quit date: 03/16/2013  . Smokeless tobacco: Never Used  . Alcohol Use: No   OB History    Gravida Para Term Preterm AB TAB SAB Ectopic Multiple Living   4 2 1 1 2 2    2      Review of Systems  Constitutional: Negative for fever, chills, diaphoresis, appetite change and fatigue.  HENT: Positive for ear pain and rhinorrhea. Negative for mouth sores, sore throat and trouble swallowing.   Eyes: Negative for visual disturbance.  Respiratory: Negative for cough, chest tightness, shortness of breath and wheezing.   Cardiovascular: Negative for chest pain.  Gastrointestinal: Negative for nausea, vomiting, abdominal pain, diarrhea and abdominal distention.  Endocrine: Negative for polydipsia, polyphagia and polyuria.  Genitourinary: Negative for dysuria, frequency and hematuria.  Musculoskeletal: Negative for gait problem.  Skin: Negative for color change, pallor and rash.   Neurological: Negative for dizziness, syncope, light-headedness and headaches.  Hematological: Does not bruise/bleed easily.  Psychiatric/Behavioral: Negative for behavioral problems and confusion.      Allergies  Penicillins; Tramadol; Reglan; and Toradol  Home Medications   Prior to Admission medications   Medication Sig Start Date End Date Taking? Authorizing Provider  cefUROXime (CEFTIN) 500 MG tablet Take 1 tablet (500 mg total) by mouth 2 (two) times daily with a meal. 12/09/14 12/19/14  12/21/14, MD  diazepam (VALIUM) 5 MG tablet Take 1 tablet (5 mg total) by mouth every 6 (six) hours as needed for anxiety (spasms). 10/06/14   12/05/14, MD  ferrous sulfate 325 (65 FE) MG tablet Take 1 tablet (325 mg total) by mouth 2 (two) times daily before a meal. For anemia 03/21/14   03/23/14, NP  FLUoxetine (PROZAC) 20 MG capsule Take 1 capsule (20 mg total) by mouth at bedtime. For depression 03/21/14   03/23/14, NP  HYDROcodone-acetaminophen (NORCO) 5-325 MG per tablet Take 1 tablet by mouth every 6 (six) hours as needed for moderate pain. 07/20/14   07/22/14, MD  hydrOXYzine (ATARAX/VISTARIL) 25 MG tablet Take 1 tablet (25 mg) three times daily as needed: For anxiety 03/21/14   03/23/14, NP  levothyroxine (SYNTHROID, LEVOTHROID) 175 MCG tablet Take 1 tablet (175 mcg total) by mouth daily. For low thyroid function 03/21/14   03/23/14  I Nwoko, NP  meloxicam (MOBIC) 7.5 MG tablet Take 1 tablet (7.5 mg total) by mouth daily. 04/14/14   Elpidio Anis, PA-C  ondansetron (ZOFRAN ODT) 4 MG disintegrating tablet Take 1 tablet (4 mg total) by mouth every 8 (eight) hours as needed for nausea or vomiting. 04/28/14   John Molpus, MD  oxyCODONE-acetaminophen (PERCOCET) 5-325 MG per tablet Take 1-2 tablets by mouth every 6 (six) hours as needed (for pain). 09/23/14   John Molpus, MD  oxyCODONE-acetaminophen (PERCOCET) 5-325 MG per tablet Take 1-2 tablets by mouth every 6 (six) hours as  needed. 10/06/14   Geoffery Lyons, MD  predniSONE (DELTASONE) 10 MG tablet Take as a 6 day taper. 6 on day one. 5 on day 2. 4 on day 3. 3 on day 4. 2 on day 5. 1 on day 6. 08/30/14   Elson Areas, PA-C  traMADol (ULTRAM) 50 MG tablet Take 1 tablet (50 mg total) by mouth every 6 (six) hours as needed. 10/26/14   Glynn Octave, MD  zolpidem (AMBIEN) 5 MG tablet Take 1 tablet (5 mg total) by mouth at bedtime as needed for sleep. 03/21/14   Sanjuana Kava, NP   BP 156/109 mmHg  Pulse 94  Temp(Src) 98.9 F (37.2 C) (Oral)  Resp 28  Wt 185 lb (83.915 kg)  SpO2 95%  LMP 11/25/2014 Physical Exam  Constitutional: She is oriented to person, place, and time. She appears well-developed and well-nourished. No distress.  Crying has the room. As I approach her she covers her ear with both hands and states "don't touch it don't touch it".  HENT:  Head: Normocephalic.  External ear appears normal. No rash or lesions or vesicles. Canal normal. Has mild erythema and minimal bulging of the right TM.  Eyes: Conjunctivae are normal. Pupils are equal, round, and reactive to light. No scleral icterus.  Neck: Normal range of motion. Neck supple. No thyromegaly present.  Cardiovascular: Normal rate and regular rhythm.  Exam reveals no gallop and no friction rub.   No murmur heard. Pulmonary/Chest: Effort normal and breath sounds normal. No respiratory distress. She has no wheezes. She has no rales.  Abdominal: Soft. Bowel sounds are normal. She exhibits no distension. There is no tenderness. There is no rebound.  Musculoskeletal: Normal range of motion.  Neurological: She is alert and oriented to person, place, and time.  Skin: Skin is warm and dry. No rash noted.  Psychiatric: She has a normal mood and affect. Her behavior is normal.    ED Course  Procedures (including critical care time) Labs Review Labs Reviewed - No data to display  Imaging Review No results found.   EKG Interpretation None       MDM   Final diagnoses:  Acute right otitis media, recurrence not specified, unspecified otitis media type    Plan is symptomatic treatment. Tylenol and Motrin as per normal treatment for a simple uncomplicated otitis. I did discuss this with her at length. I offered her Zofran and a single Vicodin here. Otherwise a recommended completion around buttocks therapy, and over-the-counter medications for the expected pain from a simple otitis.    Rolland Porter, MD 12/09/14 1050

## 2014-12-09 NOTE — ED Notes (Signed)
Patient here with severe right ear pain with sinus pressure and vomiting. Crying due to the ear pain

## 2014-12-25 ENCOUNTER — Encounter (HOSPITAL_BASED_OUTPATIENT_CLINIC_OR_DEPARTMENT_OTHER): Payer: Self-pay | Admitting: *Deleted

## 2014-12-25 ENCOUNTER — Emergency Department (HOSPITAL_BASED_OUTPATIENT_CLINIC_OR_DEPARTMENT_OTHER)
Admission: EM | Admit: 2014-12-25 | Discharge: 2014-12-25 | Disposition: A | Discharge status: Home / Self Care | Payer: Medicaid Other | Attending: Emergency Medicine | Admitting: Emergency Medicine

## 2014-12-25 DIAGNOSIS — Z880 Allergy status to penicillin: Secondary | ICD-10-CM | POA: Insufficient documentation

## 2014-12-25 DIAGNOSIS — Z760 Encounter for issue of repeat prescription: Secondary | ICD-10-CM

## 2014-12-25 DIAGNOSIS — F329 Major depressive disorder, single episode, unspecified: Secondary | ICD-10-CM | POA: Insufficient documentation

## 2014-12-25 DIAGNOSIS — Z79899 Other long term (current) drug therapy: Secondary | ICD-10-CM | POA: Insufficient documentation

## 2014-12-25 DIAGNOSIS — Z7952 Long term (current) use of systemic steroids: Secondary | ICD-10-CM | POA: Insufficient documentation

## 2014-12-25 DIAGNOSIS — F419 Anxiety disorder, unspecified: Secondary | ICD-10-CM | POA: Insufficient documentation

## 2014-12-25 DIAGNOSIS — M069 Rheumatoid arthritis, unspecified: Secondary | ICD-10-CM

## 2014-12-25 DIAGNOSIS — E079 Disorder of thyroid, unspecified: Secondary | ICD-10-CM

## 2014-12-25 DIAGNOSIS — Z87891 Personal history of nicotine dependence: Secondary | ICD-10-CM | POA: Insufficient documentation

## 2014-12-25 MED ORDER — MELOXICAM 7.5 MG PO TABS: 7.5000 mg | ORAL_TABLET | Freq: Every day | ORAL | Status: DC

## 2014-12-25 MED ORDER — LEVOTHYROXINE SODIUM 175 MCG PO TABS: 175.0000 ug | ORAL_TABLET | Freq: Every day | ORAL | Status: DC

## 2014-12-25 MED ORDER — TRAMADOL HCL 50 MG PO TABS: 50.0000 mg | ORAL_TABLET | Freq: Four times a day (QID) | ORAL | Status: DC | PRN

## 2014-12-25 NOTE — ED Provider Notes (Signed)
CSN: [id number]     Arrival date & time 12/25/14  1914 History   First MD Initiated Contact with Patient 12/25/14 1924     Chief Complaint  Patient presents with  . Medication Refill     (Consider location/radiation/quality/duration/timing/severity/associated sxs/prior Treatment) HPI Pt is a 34yo female with hx of thyroid disease, depression, RA, anxiety, ovarian cysts, and seizures, presenting to ED with request for medication refills as she states her PCP is out of town for 1 week.  Pt denies any symptoms at this time but reports having seizures if she suddenly stops taking her tramadol.  States she called her PCP multiple times last week and went to office yesterday but was advised her PCP was on vacation and would have to pay $120 for an "urgent visit" with a different provider in the practice.  Pt requesting refill on tramadol, levothyroxine, and meloxicam. No other concerns or complaints at this time.    Past Medical History  Diagnosis Date  . Thyroid disease   . Abnormal Pap smear   . Allergy   . Depression   . Rheumatoid arthritis(714.0)   . Anxiety   . Ovarian cyst   . PID (acute pelvic inflammatory disease)    Past Surgical History  Procedure Laterality Date  . Tubal ligation    . Laparoscopic tubal ligation     Family History  Problem Relation Age of Onset  . Diabetes Mother   . Hypertension Mother   . Alcohol abuse Paternal Grandmother   . Hypothyroidism Paternal Grandfather   . Other Neg Hx    History  Substance Use Topics  . Smoking status: Former Smoker    Quit date: 03/16/2013  . Smokeless tobacco: Never Used  . Alcohol Use: No   OB History    Gravida Para Term Preterm AB TAB SAB Ectopic Multiple Living   4 2 1 1 2 2    2      Review of Systems  Constitutional: Negative for fever, chills, diaphoresis and fatigue.  Respiratory: Negative for cough and shortness of breath.   Cardiovascular: Negative for chest pain and palpitations.  Gastrointestinal:  Negative for nausea, vomiting, abdominal pain and diarrhea.  Musculoskeletal: Positive for myalgias and arthralgias.  All other systems reviewed and are negative.     Allergies  Penicillins; Tramadol; Reglan; and Toradol  Home Medications   Prior to Admission medications   Medication Sig Start Date End Date Taking? Authorizing Provider  diazepam (VALIUM) 5 MG tablet Take 1 tablet (5 mg total) by mouth every 6 (six) hours as needed for anxiety (spasms). 10/06/14   12/05/14, MD  ferrous sulfate 325 (65 FE) MG tablet Take 1 tablet (325 mg total) by mouth 2 (two) times daily before a meal. For anemia 03/21/14   03/23/14, NP  FLUoxetine (PROZAC) 20 MG capsule Take 1 capsule (20 mg total) by mouth at bedtime. For depression 03/21/14   03/23/14, NP  HYDROcodone-acetaminophen (NORCO) 5-325 MG per tablet Take 1 tablet by mouth every 6 (six) hours as needed for moderate pain. 07/20/14   07/22/14, MD  hydrOXYzine (ATARAX/VISTARIL) 25 MG tablet Take 1 tablet (25 mg) three times daily as needed: For anxiety 03/21/14   03/23/14, NP  levothyroxine (SYNTHROID, LEVOTHROID) 175 MCG tablet Take 1 tablet (175 mcg total) by mouth daily. For low thyroid function 12/25/14   12/27/14, PA-C  meloxicam (MOBIC) 7.5 MG tablet Take 1 tablet (7.5 mg total) by mouth daily.  12/25/14   Junius Finner, PA-C  ondansetron (ZOFRAN ODT) 4 MG disintegrating tablet Take 1 tablet (4 mg total) by mouth every 8 (eight) hours as needed for nausea or vomiting. 04/28/14   John Molpus, MD  oxyCODONE-acetaminophen (PERCOCET) 5-325 MG per tablet Take 1-2 tablets by mouth every 6 (six) hours as needed (for pain). 09/23/14   John Molpus, MD  oxyCODONE-acetaminophen (PERCOCET) 5-325 MG per tablet Take 1-2 tablets by mouth every 6 (six) hours as needed. 10/06/14   Geoffery Lyons, MD  predniSONE (DELTASONE) 10 MG tablet Take as a 6 day taper. 6 on day one. 5 on day 2. 4 on day 3. 3 on day 4. 2 on day 5. 1 on day 6. 08/30/14    Elson Areas, PA-C  traMADol (ULTRAM) 50 MG tablet Take 1 tablet (50 mg total) by mouth every 6 (six) hours as needed. 12/25/14   Junius Finner, PA-C  zolpidem (AMBIEN) 5 MG tablet Take 1 tablet (5 mg total) by mouth at bedtime as needed for sleep. 03/21/14   Sanjuana Kava, NP   BP 113/90 mmHg  Pulse 118  Temp(Src) 98 F (36.7 C) (Oral)  Resp 20  Ht 5\' 6"  (1.676 m)  Wt 185 lb (83.915 kg)  BMI 29.87 kg/m2  SpO2 100%  LMP 12/20/2014 Physical Exam  Constitutional: She is oriented to person, place, and time. She appears well-developed and well-nourished.  HENT:  Head: Normocephalic and atraumatic.  Eyes: EOM are normal.  Neck: Normal range of motion.  Cardiovascular: Normal rate.   Pulmonary/Chest: Effort normal.  Musculoskeletal: Normal range of motion.  Neurological: She is alert and oriented to person, place, and time.  Skin: Skin is warm and dry.  Psychiatric: She has a normal mood and affect. Her behavior is normal.  Nursing note and vitals reviewed.   ED Course  Procedures (including critical care time) Labs Review Labs Reviewed - No data to display  Imaging Review No results found.   EKG Interpretation None      MDM   Final diagnoses:  Medication refill  Thyroid disease  Rheumatoid arthritis    Pt requesting medication refill. No symptoms at this time. Discussed proper use of PCP for medication refills and advised only 15 tabs of tramadol would be prescribed today.  Levothyroxine and meloxicam also refilled.     12/22/2014, PA-C 12/25/14 2210  12/27/14, MD 12/25/14 [phone]

## 2014-12-25 NOTE — ED Notes (Signed)
Her MD is on vacation and she needs her Rx's filled.

## 2015-06-01 ENCOUNTER — Encounter (HOSPITAL_COMMUNITY): Payer: Self-pay

## 2015-06-01 ENCOUNTER — Emergency Department (HOSPITAL_COMMUNITY)
Admission: EM | Admit: 2015-06-01 | Discharge: 2015-06-02 | Disposition: A | Discharge status: Home / Self Care | Payer: Medicaid Other | Attending: Emergency Medicine | Admitting: Emergency Medicine

## 2015-06-01 DIAGNOSIS — Z8659 Personal history of other mental and behavioral disorders: Secondary | ICD-10-CM | POA: Insufficient documentation

## 2015-06-01 DIAGNOSIS — E079 Disorder of thyroid, unspecified: Secondary | ICD-10-CM | POA: Insufficient documentation

## 2015-06-01 DIAGNOSIS — R110 Nausea: Secondary | ICD-10-CM | POA: Insufficient documentation

## 2015-06-01 DIAGNOSIS — Z8739 Personal history of other diseases of the musculoskeletal system and connective tissue: Secondary | ICD-10-CM | POA: Insufficient documentation

## 2015-06-01 DIAGNOSIS — Z8742 Personal history of other diseases of the female genital tract: Secondary | ICD-10-CM | POA: Insufficient documentation

## 2015-06-01 DIAGNOSIS — Z87891 Personal history of nicotine dependence: Secondary | ICD-10-CM | POA: Insufficient documentation

## 2015-06-01 DIAGNOSIS — Z79899 Other long term (current) drug therapy: Secondary | ICD-10-CM | POA: Insufficient documentation

## 2015-06-01 DIAGNOSIS — J3489 Other specified disorders of nose and nasal sinuses: Secondary | ICD-10-CM | POA: Insufficient documentation

## 2015-06-01 DIAGNOSIS — R519 Headache, unspecified: Secondary | ICD-10-CM

## 2015-06-01 DIAGNOSIS — R51 Headache: Secondary | ICD-10-CM | POA: Insufficient documentation

## 2015-06-01 DIAGNOSIS — Z880 Allergy status to penicillin: Secondary | ICD-10-CM | POA: Insufficient documentation

## 2015-06-01 DIAGNOSIS — G8929 Other chronic pain: Secondary | ICD-10-CM | POA: Insufficient documentation

## 2015-06-01 HISTORY — DX: Other chronic pain: G89.29

## 2015-06-01 HISTORY — DX: Headache, unspecified: R51.9

## 2015-06-01 HISTORY — DX: Cervicalgia: M54.2

## 2015-06-01 HISTORY — DX: Headache: R51

## 2015-06-01 MED ORDER — DIPHENHYDRAMINE HCL 50 MG/ML IJ SOLN
50.0000 mg | Freq: Once | INTRAMUSCULAR | Status: AC
  Administered 2015-06-01: 50 mg via INTRAMUSCULAR
  Filled 2015-06-01: qty 1

## 2015-06-01 MED ORDER — PROMETHAZINE HCL 25 MG/ML IJ SOLN
25.0000 mg | Freq: Once | INTRAMUSCULAR | Status: AC
  Administered 2015-06-01: 25 mg via INTRAMUSCULAR
  Filled 2015-06-01: qty 1

## 2015-06-01 MED ORDER — ACETAMINOPHEN 500 MG PO TABS
1000.0000 mg | ORAL_TABLET | Freq: Once | ORAL | Status: AC
  Administered 2015-06-01: 1000 mg via ORAL
  Filled 2015-06-01: qty 2

## 2015-06-01 MED ORDER — PROMETHAZINE HCL 25 MG PO TABS: 25.0000 mg | ORAL_TABLET | Freq: Four times a day (QID) | ORAL | Status: DC | PRN

## 2015-06-01 MED ORDER — DEXAMETHASONE SODIUM PHOSPHATE 4 MG/ML IJ SOLN: 10.0000 mg | Freq: Once | INTRAMUSCULAR | Status: DC

## 2015-06-01 NOTE — Discharge Instructions (Signed)
°Emergency Department Resource Guide °1) Find a Doctor and Pay Out of Pocket °Although you won't have to find out who is covered by your insurance plan, it is a good idea to ask around and get recommendations. You will then need to call the office and see if the doctor you have chosen will accept you as a new patient and what types of options they offer for patients who are self-pay. Some doctors offer discounts or will set up payment plans for their patients who do not have insurance, but you will need to ask so you aren't surprised when you get to your appointment. ° °2) Contact Your Local Health Department °Not all health departments have doctors that can see patients for sick visits, but many do, so it is worth a call to see if yours does. If you don't know where your local health department is, you can check in your phone book. The CDC also has a tool to help you locate your state's health department, and many state websites also have listings of all of their local health departments. ° °3) Find a Walk-in Clinic °If your illness is not likely to be very severe or complicated, you may want to try a walk in clinic. These are popping up all over the country in pharmacies, drugstores, and shopping centers. They're usually staffed by nurse practitioners or physician assistants that have been trained to treat common illnesses and complaints. They're usually fairly quick and inexpensive. However, if you have serious medical issues or chronic medical problems, these are probably not your best option. ° °No Primary Care Doctor: °- Call [facility] at  832-8000 - they can help you locate a primary care doctor that  accepts your insurance, provides certain services, etc. °- Physician Referral Service- 1-800-533-3463 ° °Chronic Pain Problems: °Organization         Address  Phone   Notes  °[facility]  (336) 297-2271 Patients need to be referred by their primary care doctor.  ° °Medication  Assistance: °Organization         Address  Phone   Notes  °[place] [facility] [address] °[place], [place] [address] (336) 641-8030 --Must be a resident of [place] °-- Must have NO insurance coverage whatsoever (no Medicaid/ Medicare, etc.) °-- The pt. MUST have a primary care doctor that directs their care regularly and follows them in the community °  °[facility]  (866) 331-1348   °[employer]  (888) 892-1162   ° °Agencies that provide inexpensive medical care: °Organization         Address  Phone   Notes  °[facility]  (336) 832-8035   °[facility]    (336) 832-7272   °[facility] [address] °[place], [place] [address] (336) 832-4777   °[facility] [address], °[place] (336) 271-4999   °[facility]    (336) 373-0678   °[facility]    (336) 272-1050   °[facility] ° [address], [place] Phone:  (336) 832-4444, Fax:  (336) 832-4440 Hours of Operation:  9 am - 6 pm, M-F.  Also accepts Medicaid/Medicare and self-pay.  °[facility] ° [address], [place] Phone: (336) 832-3150, Fax: (336) 832-3151. Hours of Operation:  8:30 am - 5:30 pm, M-F.  Also accepts Medicaid and self-pay.  °[facility] [place] [address], [place] Phone: (336) 878-6027   °[facility] [address], [place], [place] (336)723-1848, Ext. 123 Mondays & Thursdays: 7-9 AM.  First 15 patients are seen on a first come, first serve basis. °  ° °Medicaid-accepting [place] Providers: ° °Organization         Address  Phone   Notes  °[facility] [address], [place] (336) 641-2100 Also accepts self-pay patients.  °[facility] [address], [place] ° (336) 856-9996   °[facility] [address], [place]  (336) 288-8857   °[facility] [address] [place] [address], [place] (336) 299-7000   °Veita Bland [address], [place]  ° (336) 373-1557 Only accepts [place] Access Medicaid patients after they have their name applied to their card.  ° °Self-Pay (no insurance) in [place]: ° °Organization         Address  Phone   Notes  °Sickle Cell Patients, [facility] [address], [place] (336) 832-1970   °[facility] [address], [place] (336) 832-4400   °[facility] ° [address], [place] (336) 992-4800   °[facility]/Dr. Osei-Bonsu ° [address] [place] [address], [place] or [address], [place] (336) [phone] Phone number for both [place] and [place] locations is the same.  °[facility] [address], [place] (336) 299-0000   °[facility] [address] [place] [address], [place] or [address] (336) 852-7530 °(336) 878-2260   °[facility] [address], [place] (336) 350-1642, phone; (336) 294-5005, fax Sees patients 1st and 3rd Saturday of every month.  Must not qualify for public or private insurance (i.e. Medicaid, Medicare, [place] Health Choice, Veterans' Benefits) • Household income should be no more than 200% of the poverty level •The clinic cannot treat you if you are pregnant or think you are pregnant • Sexually transmitted diseases are not treated at the clinic.  ° ° °Dental Care: °Organization         Address  Phone  Notes  °[place] [facility] [address], [place] (336) 641-6152 Accepts children up to age 21 who are enrolled in Medicaid or [place] Health Choice; pregnant women with a Medicaid card; and children who have applied for Medicaid or [place] Health Choice, but were declined, whose parents can pay a reduced fee at time of service.  °[place]  [facility] [place]  [address], [place] (336) 641-7733 Accepts children up to age 21 who are enrolled in Medicaid or [place] Health Choice; pregnant women with a Medicaid card; and children who have applied for Medicaid or [place] Health Choice, but were declined, whose parents can pay a reduced fee at time of service.  °[facility] ° [address], [place] (336) 641-4533 Patients are seen by appointment only. Walk-ins are not accepted. [facility] will see patients 18 years of age and older. °Monday - Tuesday (8am-5pm) °Most Wednesdays (8:30-5pm) °$30 per visit, cash only  °[facility] ° [address], [place] (336) 641-4533 Patients are seen by appointment only. Walk-ins are not accepted. [facility] will see patients 18 years of age and older. °One   Wednesday Evening (Monthly: Volunteer Based).  $30 per visit, cash only  °[facility]  (919) 537-3737 for adults; Children under age 4, call Graduate Pediatric Dentistry at (919) [phone]. Children aged 4-14, please call (919) [phone] to request a pediatric application. ° Dental services are provided in all areas of dental care including fillings, crowns and bridges, complete and partial dentures, implants, gum treatment, root canals, and extractions. Preventive care is also provided. Treatment is provided to both adults and children. °Patients are selected via a lottery and there is often a waiting list. °  °[facility] [address], °[place] ° (336) 763-8833 [email or web address] °  °[facility] [address], [place], [place] (336)723-1848, Ext. 123 Second and Fourth Thursday of each month, opens at 6:30 AM; Clinic ends at 9 AM.  Patients are seen on a first-come first-served basis, and a limited number are seen during each clinic.  ° °[facility] ° [address], [place], [place] (336) 723-7904    Eligibility Requirements °You must have lived in [place], or [place] counties for at least the last three months. °  You cannot be eligible for state or federal sponsored healthcare insurance, including Veterans Administration, Medicaid, or Medicare. °  You generally cannot be eligible for healthcare insurance through your employer.  °  How to apply: °Eligibility screenings are held every Tuesday and Wednesday afternoon from 1:00 pm until 4:00 pm. You do not need an appointment for the interview!  °[facility] [address], [place], [place] 336-631-2330   °[facility]  336-342-8273   °[facility]  336-703-3100   °[facility]  336-570-6415   ° °Behavioral Health Resources in the Community: °Intensive Outpatient Programs °Organization         Address  Phone  Notes  °[facility] [address], [place], [place] 336-878-6098   °[facility] [address], [place], [place] 336-832-9800   °[facility] [address], [place], [place] ° 336-882-2125   °[facility] [address],  °[place], [place] 1-800-853-5163 or 336-641-4981   °Substance Abuse Resources °Organization         Address  Phone  Notes  °[facility]  336-882-2125   °[facility]  336-784-9470   °[facility]  336-285-9073   °[facility]  336-845-3988   °Residential & Outpatient Substance Abuse Program  1-800-659-3381   °Psychological Services °Organization         Address  Phone  Notes  °[facility]  336- 832-9600   °[facility]  336- 378-7881   °[facility] [address], [place] 1-800-853-5163 or 336-641-4981   ° °Mobile Crisis Teams °Organization         Address  Phone  Notes  °[facility], Mobile Crisis Care Unit  1-877-626-1772   °Assertive °[facility] ° [address]  [place], [place] 336-834-9664   °Sharon DeEsch [address] °[place] 336-554-5454   ° °Self-Help/Support Groups °Organization         Address  Phone             Notes  °[facility]  - variety of support groups  336- 373-1402 Call for more information  °Narcotics Anonymous (NA), [facility] [address], °[place]  2 meetings at this location  ° °  Residential Treatment Programs Organization         Address  Phone  Notes  [facility] [address],    [place]  3-419-622-2979   [facility]  [address], [place] [address], [place], [place] 417-408-1448   [facility] [address] [place], [place] 185-631-4970 Admissions: 8am-3pm M-F  [facility] [address]    [place], [place] 263-785-8850   [facility] [address] [place], [place], [place] 277-412-8786   [facility] [address]  [place], [place] 767-209-4709   [facility] - Intensive Outpatient [address], [place], [place] 628-366-2947   [facility] ([facility] [address] [place]  [place], [place] 6-546-503-5465 or 765-436-1508   [facility] ([facility] [address] [place], [place] 174-944-9675 Accepts Medicaid  [facility] [address],  [place] 9-163-846-6599 Substance Abuse/Addiction Treatment   [facility]         Address  Phone  Notes  [facility]  (606)105-7551   Angie Fava, PhD [address] [place], [place]   (513) 266-7597 or (303)005-8810   [facility]   [address] [place], [place] [phone]   [facility] [address] [place], [place] (619) 021-2723 Insurance/Medicaid/sponsorship through [facility] [address]                                    [place], [place] [phone] Therapy/tele-psych/case    [facility] [address], [place] [phone]    Dr. Lolly Mustache  [phone]   [facility]  [employer] [facility] Dept. 1) [address] [place] 2) [address] [place] 3)  [address], [place] [phone]  (936)347-8193   [facility] Child Abuse Hotline 315 260 6180 or 325-698-6994 (After Hours)      Take over the counter tylenol and ibuprofen (OR excedrin) and benadryl, as directed on packaging, with the prescription given to you today, as needed for headache.  Keep a headache diary. Call your regular medical doctor on Monday to schedule a follow up appointment within the next 3 days.  Return to the Emergency Department immediately sooner if worsening.

## 2015-06-01 NOTE — ED Provider Notes (Signed)
CSN: [id number]     Arrival date & time 06/01/15  2137 History   First MD Initiated Contact with Patient 06/01/15 2153     Chief Complaint  Patient presents with  . Headache      HPI Pt was seen at 2200. Per pt, c/o gradual onset and persistence of constant frontal headache since this morning.  Describes the headache as located in her forehead and "throbbing." Has been associated with nausea.  Denies headache was sudden or maximal in onset or at any time.  Denies visual changes, no focal motor weakness, no tingling/numbness in extremities, no fevers, no neck pain, no rash.     Past Medical History  Diagnosis Date  . Thyroid disease   . Abnormal Pap smear   . Allergy   . Depression   . Rheumatoid arthritis(714.0)   . Anxiety   . Ovarian cyst   . PID (acute pelvic inflammatory disease)   . Headache   . Chronic neck pain    Past Surgical History  Procedure Laterality Date  . Tubal ligation    . Laparoscopic tubal ligation     Family History  Problem Relation Age of Onset  . Diabetes Mother   . Hypertension Mother   . Alcohol abuse Paternal Grandmother   . Hypothyroidism Paternal Grandfather   . Other Neg Hx    Social History  Substance Use Topics  . Smoking status: Former Smoker    Quit date: 03/16/2013  . Smokeless tobacco: Never Used  . Alcohol Use: No   OB History    Gravida Para Term Preterm AB TAB SAB Ectopic Multiple Living   4 2 1 1 2 2    2      Review of Systems ROS: Statement: All systems negative except as marked or noted in the HPI; Constitutional: Negative for fever and chills. ; ; Eyes: Negative for eye pain, redness and discharge. ; ; ENMT: +frontal headache. Negative for ear pain, hoarseness, nasal congestion, sinus pressure and sore throat. ; ; Cardiovascular: Negative for chest pain, palpitations, diaphoresis, dyspnea and peripheral edema. ; ; Respiratory: Negative for cough, wheezing and stridor. ; ; Gastrointestinal: +nausea. Negative for vomiting,  diarrhea, abdominal pain, blood in stool, hematemesis, jaundice and rectal bleeding. . ; ; Genitourinary: Negative for dysuria, flank pain and hematuria. ; ; Musculoskeletal: Negative for back pain and neck pain. Negative for swelling and trauma.; ; Skin: Negative for pruritus, rash, abrasions, blisters, bruising and skin lesion.; ; Neuro: Negative for lightheadedness and neck stiffness. Negative for weakness, altered level of consciousness , altered mental status, extremity weakness, paresthesias, involuntary movement, seizure and syncope.      Allergies  Penicillins; Tramadol; Reglan; and Toradol  Home Medications   Prior to Admission medications   Medication Sig Start Date End Date Taking? Authorizing Provider  levothyroxine (SYNTHROID, LEVOTHROID) 175 MCG tablet Take 1 tablet (175 mcg total) by mouth daily. For low thyroid function 12/25/14  Yes 12/27/14, PA-C  traMADol (ULTRAM) 50 MG tablet Take 1 tablet (50 mg total) by mouth every 6 (six) hours as needed. Patient taking differently: Take 50 mg by mouth every 6 (six) hours as needed for moderate pain or severe pain.  12/25/14  Yes 12/27/14, PA-C  promethazine (PHENERGAN) 25 MG tablet Take 1 tablet (25 mg total) by mouth every 6 (six) hours as needed for nausea or vomiting (or headache). 06/01/15   08/01/15, DO   BP 105/55 mmHg  Pulse 127  Temp(Src) 97.7 F (  36.5 C) (Oral)  Resp 15  Ht 5\' 7"  (1.702 m)  Wt 175 lb (79.379 kg)  BMI 27.40 kg/m2  SpO2 100%  LMP 05/08/2015 Physical Exam  2205: Physical examination:  Nursing notes reviewed; Vital signs and O2 SAT reviewed;  Constitutional: Well developed, Well nourished, Well hydrated, In no acute distress; Head:  Normocephalic, atraumatic; Eyes: EOMI, PERRL, No scleral icterus; ENMT: TM's clear bilat. +edemetous nasal turbinates bilat with clear rhinorrhea. Mouth and pharynx normal, Mucous membranes moist; Neck: Supple, Full range of motion, No lymphadenopathy;  Cardiovascular: Tachycardic rate and rhythm, No murmur, rub, or gallop; Respiratory: Breath sounds clear & equal bilaterally, No rales, rhonchi, wheezes.  Speaking full sentences with ease, Normal respiratory effort/excursion; Chest: Nontender, Movement normal; Abdomen: Soft, Nontender, Nondistended, Normal bowel sounds; Genitourinary: No CVA tenderness; Extremities: Pulses normal, No tenderness, No edema, No calf edema or asymmetry.; Neuro: AA&Ox3, Major CN grossly intact. No facial droop. Speech clear. No gross focal motor or sensory deficits in extremities. Climbs on and off stretcher easily by herself. Gait steady.; Skin: Color normal, Warm, Dry.   ED Course  Procedures (including critical care time) Labs Review   Imaging Review  I have personally reviewed and evaluated these images and lab results as part of my medical decision-making.   EKG Interpretation None      MDM  MDM Reviewed: previous chart, nursing note and vitals Reviewed previous: CT scan      2250:  VS per baseline per EPIC chart review. Hx headaches, no red flags on HPI. Tx symptomatically, f/u PMD. Pt states she has "run out of tramadol" and is requesting a refill. [place] Controlled Substance Database accessed: pt filled hydrocodone10/APAP 325mg  tabs, #90, on 05/22/15, and tramadol 50mg  tabs, #180, on 05/09/15. Pt not forthcoming regarding hydrocodone rx she filled last week with Pharm Tech, nor the new tramadol rx she filled 3 weeks ago.  Will not rx any narcotic medications. Dx and testing d/w pt and family.  Questions answered.  Verb understanding, agreeable to d/c home with outpt f/u.   05/24/15, DO 06/04/15 1739

## 2015-06-01 NOTE — ED Notes (Signed)
Pt states she has a high HR due to "stopping tramadol suddenly like the doctor" told her not too. States she ran out. This happens "everytime" per pt.

## 2015-06-01 NOTE — ED Notes (Signed)
Patient c/o headache all day with increased pain and photophobia starting 1 hour PTA. Also c/o nausea with no vomiting.

## 2015-06-24 ENCOUNTER — Inpatient Hospital Stay (HOSPITAL_COMMUNITY)
Admission: AD | Admit: 2015-06-24 | Discharge: 2015-06-24 | Disposition: A | Discharge status: Home / Self Care | Payer: Medicaid Other | Source: Ambulatory Visit | Attending: Family Medicine | Admitting: Family Medicine

## 2015-06-24 ENCOUNTER — Inpatient Hospital Stay (HOSPITAL_COMMUNITY): Payer: Medicaid Other

## 2015-06-24 ENCOUNTER — Encounter (HOSPITAL_COMMUNITY): Payer: Self-pay

## 2015-06-24 DIAGNOSIS — N8000 Endometriosis of the uterus, unspecified: Secondary | ICD-10-CM

## 2015-06-24 DIAGNOSIS — Z880 Allergy status to penicillin: Secondary | ICD-10-CM | POA: Insufficient documentation

## 2015-06-24 DIAGNOSIS — R109 Unspecified abdominal pain: Secondary | ICD-10-CM | POA: Insufficient documentation

## 2015-06-24 DIAGNOSIS — Z87891 Personal history of nicotine dependence: Secondary | ICD-10-CM | POA: Insufficient documentation

## 2015-06-24 LAB — CBC WITH DIFFERENTIAL/PLATELET
Basophils Absolute: 0.1 10*3/uL (ref 0.0–0.1)
Basophils Relative: 1 %
EOS ABS: 0.3 10*3/uL (ref 0.0–0.7)
EOS PCT: 2 %
HCT: 31 % — ABNORMAL LOW (ref 36.0–46.0)
Hemoglobin: 9.1 g/dL — ABNORMAL LOW (ref 12.0–15.0)
LYMPHS ABS: 2.1 10*3/uL (ref 0.7–4.0)
Lymphocytes Relative: 17 %
MCH: 19.2 pg — AB (ref 26.0–34.0)
MCHC: 29.4 g/dL — AB (ref 30.0–36.0)
MCV: 65.5 fL — ABNORMAL LOW (ref 78.0–100.0)
Monocytes Absolute: 0.6 10*3/uL (ref 0.1–1.0)
Monocytes Relative: 5 %
Neutro Abs: 9.6 10*3/uL — ABNORMAL HIGH (ref 1.7–7.7)
Neutrophils Relative %: 76 %
PLATELETS: 486 10*3/uL — AB (ref 150–400)
RBC: 4.73 MIL/uL (ref 3.87–5.11)
RDW: 17.3 % — AB (ref 11.5–15.5)
WBC: 12.8 10*3/uL — AB (ref 4.0–10.5)

## 2015-06-24 LAB — URINALYSIS, ROUTINE W REFLEX MICROSCOPIC
Bilirubin Urine: NEGATIVE
Glucose, UA: NEGATIVE mg/dL
KETONES UR: NEGATIVE mg/dL
Leukocytes, UA: NEGATIVE
NITRITE: NEGATIVE
PROTEIN: NEGATIVE mg/dL
Specific Gravity, Urine: 1.025 (ref 1.005–1.030)
UROBILINOGEN UA: 0.2 mg/dL (ref 0.0–1.0)
pH: 5.5 (ref 5.0–8.0)

## 2015-06-24 LAB — URINE MICROSCOPIC-ADD ON

## 2015-06-24 LAB — WET PREP, GENITAL
Clue Cells Wet Prep HPF POC: NONE SEEN
Trich, Wet Prep: NONE SEEN
Yeast Wet Prep HPF POC: NONE SEEN

## 2015-06-24 LAB — RAPID URINE DRUG SCREEN, HOSP PERFORMED
AMPHETAMINES: NOT DETECTED
BARBITURATES: NOT DETECTED
BENZODIAZEPINES: NOT DETECTED
Cocaine: NOT DETECTED
Opiates: POSITIVE — AB
Tetrahydrocannabinol: NOT DETECTED

## 2015-06-24 LAB — POCT PREGNANCY, URINE: PREG TEST UR: NEGATIVE

## 2015-06-24 MED ORDER — IBUPROFEN 600 MG PO TABS
600.0000 mg | ORAL_TABLET | Freq: Once | ORAL | Status: AC
  Administered 2015-06-24: 600 mg via ORAL
  Filled 2015-06-24: qty 1

## 2015-06-24 MED ORDER — HYDROCODONE-ACETAMINOPHEN 5-325 MG PO TABS
1.0000 | ORAL_TABLET | Freq: Once | ORAL | Status: DC
  Filled 2015-06-24: qty 1

## 2015-06-24 NOTE — MAU Note (Signed)
Pt presents complaining of lower abdominal pain. Pt states she hasn't had a period in 2 months until today when she started spotting. Pain is also in her lower back and groin and where she thinks her ovaries are. Pt has tried ibuprofen and heating pads. Pain makes her nauseous.

## 2015-06-24 NOTE — MAU Provider Note (Signed)
History    CSN: [id number]  Arrival date and time: 06/24/15 1910   None     Chief Complaint  Patient presents with  . Possible Pregnancy  . Abdominal Pain  . Back Pain   HPI Comments:  Pt in with severe abd pain that has been going on for several weeks. Pt has hx of Rx drug abuse. Pt had BTL in 2012. Denies discharge of vag bleeding.  Patient states that she has a "very old rx of percocet" that she has tried. On the database it shows: 05/09/15 tramadol #180 05/22/15: vicodin #90 06/02/15: tramadol #12 06/05/15: vicodin #12 06/07/15: tramadol #120 with 1 refill    Abdominal Pain This is a new problem. The current episode started 1 to 4 weeks ago. The onset quality is gradual. The problem occurs intermittently. The problem has been unchanged. The pain is located in the suprapubic region. The pain is at a severity of 9/10. The quality of the pain is cramping. The abdominal pain radiates to the back. Nothing aggravates the pain. The pain is relieved by nothing. She has tried nothing for the symptoms.    OB History    Gravida Para Term Preterm AB TAB SAB Ectopic Multiple Living   4 2 1 1 2 2    2       Past Medical History  Diagnosis Date  . Thyroid disease   . Abnormal Pap smear   . Allergy   . Depression   . Rheumatoid arthritis(714.0)   . Anxiety   . Ovarian cyst   . PID (acute pelvic inflammatory disease)   . Headache   . Chronic neck pain     Past Surgical History  Procedure Laterality Date  . Tubal ligation    . Laparoscopic tubal ligation      Family History  Problem Relation Age of Onset  . Diabetes Mother   . Hypertension Mother   . Alcohol abuse Paternal Grandmother   . Hypothyroidism Paternal Grandfather   . Other Neg Hx     Social History  Substance Use Topics  . Smoking status: Former Smoker    Quit date: 03/16/2013  . Smokeless tobacco: Never Used  . Alcohol Use: No    Allergies:  Allergies  Allergen Reactions  . Penicillins Other (See  Comments)    03/18/2013 syndrome  Has patient had a PCN reaction causing immediate rash, facial/tongue/throat swelling, SOB or lightheadedness with hypotension: Yes Has patient had a PCN reaction causing severe rash involving mucus membranes or skin necrosis: Yes Has patient had a PCN reaction that required hospitalization Yes Has patient had a PCN reaction occurring within the last 10 years: No If all of the above answers are "NO", then may proceed with Cephalosporin use.   . Tramadol Other (See Comments)    Seizures if abruptly stopped.   . Reglan [Metoclopramide] Nausea Only    Feeling like bugs crawling under her skin  . Toradol [Ketorolac Tromethamine] Swelling    At the site of injection    Prescriptions prior to admission  Medication Sig Dispense Refill Last Dose  . ibuprofen (ADVIL,MOTRIN) 200 MG tablet Take 600-800 mg by mouth every 6 (six) hours as needed for moderate pain.   06/24/2015 at Unknown time  . levothyroxine (SYNTHROID, LEVOTHROID) 175 MCG tablet Take 1 tablet (175 mcg total) by mouth daily. For low thyroid function 30 tablet 0 06/23/2015 at Unknown time  . traMADol (ULTRAM) 50 MG tablet Take 1 tablet (50 mg total)  by mouth every 6 (six) hours as needed. (Patient taking differently: Take 50 mg by mouth every 6 (six) hours as needed for moderate pain or severe pain. ) 15 tablet 0 06/23/2015 at Unknown time  . promethazine (PHENERGAN) 25 MG tablet Take 1 tablet (25 mg total) by mouth every 6 (six) hours as needed for nausea or vomiting (or headache). (Patient not taking: Reported on 06/24/2015) 8 tablet 0 Not Taking at Unknown time    Review of Systems  Constitutional: Negative.   HENT: Negative.   Eyes: Negative.   Cardiovascular: Negative.   Gastrointestinal: Positive for abdominal pain.  Genitourinary: Negative.   Musculoskeletal: Negative.   Skin: Negative.   Neurological: Negative.   Endo/Heme/Allergies: Negative.   Psychiatric/Behavioral: Negative.     Physical Exam   Blood pressure 136/84, pulse 115, temperature 98.6 F (37 C), temperature source Oral, resp. rate 18, height 5\' 6"  (1.676 m), weight 198 lb 6.4 oz (89.994 kg), last menstrual period 06/24/2015.  Physical Exam  Nursing note and vitals reviewed. Constitutional: She is oriented to person, place, and time. She appears well-developed and well-nourished.  HENT:  Head: Normocephalic.  Eyes: Pupils are equal, round, and reactive to light.  Neck: Normal range of motion. Neck supple.  Cardiovascular: Normal rate, regular rhythm, normal heart sounds and intact distal pulses.   Respiratory: Effort normal and breath sounds normal.  GI: Soft. Bowel sounds are normal.  Genitourinary: Vagina normal and uterus normal.  Musculoskeletal: Normal range of motion.  Neurological: She is alert and oriented to person, place, and time. She has normal reflexes.  Skin: Skin is warm and dry.  Psychiatric: She has a normal mood and affect. Her behavior is normal. Judgment and thought content normal.   Results for orders placed or performed during the hospital encounter of 06/24/15 (from the past 24 hour(s))  Urinalysis, Routine w reflex microscopic (not at [facility])     Status: Abnormal   Collection Time: 06/24/15  7:20 PM  Result Value Ref Range   Color, Urine YELLOW YELLOW   APPearance CLEAR CLEAR   Specific Gravity, Urine 1.025 1.005 - 1.030   pH 5.5 5.0 - 8.0   Glucose, UA NEGATIVE NEGATIVE mg/dL   Hgb urine dipstick TRACE (A) NEGATIVE   Bilirubin Urine NEGATIVE NEGATIVE   Ketones, ur NEGATIVE NEGATIVE mg/dL   Protein, ur NEGATIVE NEGATIVE mg/dL   Urobilinogen, UA 0.2 0.0 - 1.0 mg/dL   Nitrite NEGATIVE NEGATIVE   Leukocytes, UA NEGATIVE NEGATIVE  Urine rapid drug screen (hosp performed)     Status: Abnormal   Collection Time: 06/24/15  7:20 PM  Result Value Ref Range   Opiates POSITIVE (A) NONE DETECTED   Cocaine NONE DETECTED NONE DETECTED   Benzodiazepines NONE DETECTED NONE  DETECTED   Amphetamines NONE DETECTED NONE DETECTED   Tetrahydrocannabinol NONE DETECTED NONE DETECTED   Barbiturates NONE DETECTED NONE DETECTED  Urine microscopic-add on     Status: Abnormal   Collection Time: 06/24/15  7:20 PM  Result Value Ref Range   Squamous Epithelial / LPF RARE RARE   WBC, UA 0-2 <3 WBC/hpf   RBC / HPF 0-2 <3 RBC/hpf   Bacteria, UA FEW (A) RARE  Wet prep, genital     Status: Abnormal   Collection Time: 06/24/15  7:45 PM  Result Value Ref Range   Yeast Wet Prep HPF POC NONE SEEN NONE SEEN   Trich, Wet Prep NONE SEEN NONE SEEN   Clue Cells Wet Prep HPF POC NONE  SEEN NONE SEEN   WBC, Wet Prep HPF POC FEW (A) NONE SEEN  CBC with Differential     Status: Abnormal   Collection Time: 06/24/15  7:59 PM  Result Value Ref Range   WBC 12.8 (H) 4.0 - 10.5 K/uL   RBC 4.73 3.87 - 5.11 MIL/uL   Hemoglobin 9.1 (L) 12.0 - 15.0 g/dL   HCT 59.5 (L) 63.8 - 75.6 %   MCV 65.5 (L) 78.0 - 100.0 fL   MCH 19.2 (L) 26.0 - 34.0 pg   MCHC 29.4 (L) 30.0 - 36.0 g/dL   RDW 43.3 (H) 29.5 - 18.8 %   Platelets 486 (H) 150 - 400 K/uL   Neutrophils Relative % 76 %   Neutro Abs 9.6 (H) 1.7 - 7.7 K/uL   Lymphocytes Relative 17 %   Lymphs Abs 2.1 0.7 - 4.0 K/uL   Monocytes Relative 5 %   Monocytes Absolute 0.6 0.1 - 1.0 K/uL   Eosinophils Relative 2 %   Eosinophils Absolute 0.3 0.0 - 0.7 K/uL   Basophils Relative 1 %   Basophils Absolute 0.1 0.0 - 0.1 K/uL  Pregnancy, urine POC     Status: None   Collection Time: 06/24/15  8:08 PM  Result Value Ref Range   Preg Test, Ur NEGATIVE NEGATIVE   US Transvaginal Non-ob  06/24/2015   CLINICAL DATA:  Low back pain.  EXAM: TRANSABDOMINAL AND TRANSVAGINAL ULTRASOUND OF PELVIS  TECHNIQUE: Both transabdominal and transvaginal ultrasound examinations of the pelvis were performed. Transabdominal technique was performed for global imaging of the pelvis including uterus, ovaries, adnexal regions, and pelvic cul-de-sac. It was necessary to proceed with  endovaginal exam following the transabdominal exam to visualize the ovaries and endometrium.  COMPARISON:  06/05/2015 at [facility]  FINDINGS: Uterus  Measurements: 9 x 4 x 5 cm. A measured area of heterogeneous echogenicity at the level of the uterine body and fundus does not have discrete masslike edges and was not seen 2 weeks ago at [facility]. This heterogeneity is likely from adenomyosis, with sub endometrial cysts and indistinct endometrium better seen on the previous study.  Endometrium  Thickness: 6 mm. As above, no endometrial lesion is suspected, with reassuring comparison study.  Right ovary  Measurements: 36 x 26 x 24 mm. Normal appearance/no adnexal mass.  Left ovary  Measurements: 39 x 26 x 22 mm. Normal appearance/no adnexal mass.  Other findings  No significant or complex free fluid.  IMPRESSION: 1. No acute finding. 2. Adenomyosis changes.   Electronically Signed   By: Marnee Spring M.D.   On: 06/24/2015 21:07   US Pelvis Complete  06/24/2015   CLINICAL DATA:  Low back pain.  EXAM: TRANSABDOMINAL AND TRANSVAGINAL ULTRASOUND OF PELVIS  TECHNIQUE: Both transabdominal and transvaginal ultrasound examinations of the pelvis were performed. Transabdominal technique was performed for global imaging of the pelvis including uterus, ovaries, adnexal regions, and pelvic cul-de-sac. It was necessary to proceed with endovaginal exam following the transabdominal exam to visualize the ovaries and endometrium.  COMPARISON:  06/05/2015 at [facility]  FINDINGS: Uterus  Measurements: 9 x 4 x 5 cm. A measured area of heterogeneous echogenicity at the level of the uterine body and fundus does not have discrete masslike edges and was not seen 2 weeks ago at [facility]. This heterogeneity is likely from adenomyosis, with sub endometrial cysts and indistinct endometrium better seen on the previous study.  Endometrium  Thickness: 6 mm. As above, no  endometrial lesion is suspected,  with reassuring comparison study.  Right ovary  Measurements: 36 x 26 x 24 mm. Normal appearance/no adnexal mass.  Left ovary  Measurements: 39 x 26 x 22 mm. Normal appearance/no adnexal mass.  Other findings  No significant or complex free fluid.  IMPRESSION: 1. No acute finding. 2. Adenomyosis changes.   Electronically Signed   By: Marnee Spring M.D.   On: 06/24/2015 21:07    MAU Course  Procedures  MDM abd pain Addressed pain meds with the patient. She is demanding "something more" than tramadol and ibuprofen. Advised patient that we cannot give her anything new at this time. Will set up clinic appointment   Assessment and Plan  Wet prep. Cbc with diff, exam non contributory, if labs and pelvic u/s  normal will d/c pt home since she has obtained Rx for narcotics from multiple doctors from [place] to [employer] per database. Report given to H. Mathews Robinsons to continue pts care.  1. Abdominal pain   2. Uterus, adenomyosis    DC home Comfort measures reviewed  RX: none  Return to MAU as needed   Follow-up Information    Follow up with [facility].   Specialty:  Obstetrics and Gynecology   Why:  they will call you with an appointment    Contact information:   [address] [place] [address] [phone]     Tawnya Crook  9:40 PM 06/24/2015   Wyvonnia Dusky DARLENE 06/24/2015, 7:47 PM

## 2015-06-24 NOTE — MAU Provider Note (Signed)
Patient refuses to leave the hospital without pain medication. Will give one vicodin here. No RX will be provided. Patient was given one vicodin at 2240, and has been DC home. Also, after further review of chart she was seen at [facility] ED earlier today. She was dx with constipation.

## 2015-06-24 NOTE — Discharge Instructions (Signed)
Pelvic Pain Female pelvic pain can be caused by many different things and start from a variety of places. Pelvic pain refers to pain that is located in the lower half of the abdomen and between your hips. The pain may occur over a short period of time (acute) or may be reoccurring (chronic). The cause of pelvic pain may be related to disorders affecting the female reproductive organs (gynecologic), but it may also be related to the bladder, kidney stones, an intestinal complication, or muscle or skeletal problems. Getting help right away for pelvic pain is important, especially if there has been severe, sharp, or a sudden onset of unusual pain. It is also important to get help right away because some types of pelvic pain can be life threatening.  CAUSES  Below are only some of the causes of pelvic pain. The causes of pelvic pain can be in one of several categories.   Gynecologic.  Pelvic inflammatory disease.  Sexually transmitted infection.  Ovarian cyst or a twisted ovarian ligament (ovarian torsion).  Uterine lining that grows outside the uterus (endometriosis).  Fibroids, cysts, or tumors.  Ovulation.  Pregnancy.  Pregnancy that occurs outside the uterus (ectopic pregnancy).  Miscarriage.  Labor.  Abruption of the placenta or ruptured uterus.  Infection.  Uterine infection (endometritis).  Bladder infection.  Diverticulitis.  Miscarriage related to a uterine infection (septic abortion).  Bladder.  Inflammation of the bladder (cystitis).  Kidney stone(s).  Gastrointestinal.  Constipation.  Diverticulitis.  Neurologic.  Trauma.  Feeling pelvic pain because of mental or emotional causes (psychosomatic).  Cancers of the bowel or pelvis. EVALUATION  Your caregiver will want to take a careful history of your concerns. This includes recent changes in your health, a careful gynecologic history of your periods (menses), and a sexual history. Obtaining your family  history and medical history is also important. Your caregiver may suggest a pelvic exam. A pelvic exam will help identify the location and severity of the pain. It also helps in the evaluation of which organ system may be involved. In order to identify the cause of the pelvic pain and be properly treated, your caregiver may order tests. These tests may include:   A pregnancy test.  Pelvic ultrasonography.  An X-ray exam of the abdomen.  A urinalysis or evaluation of vaginal discharge.  Blood tests. HOME CARE INSTRUCTIONS   Only take over-the-counter or prescription medicines for pain, discomfort, or fever as directed by your caregiver.   Rest as directed by your caregiver.   Eat a balanced diet.   Drink enough fluids to make your urine clear or pale yellow, or as directed.   Avoid sexual intercourse if it causes pain.   Apply warm or cold compresses to the lower abdomen depending on which one helps the pain.   Avoid stressful situations.   Keep a journal of your pelvic pain. Write down when it started, where the pain is located, and if there are things that seem to be associated with the pain, such as food or your menstrual cycle.  Follow up with your caregiver as directed.  SEEK MEDICAL CARE IF:  Your medicine does not help your pain.  You have abnormal vaginal discharge. SEEK IMMEDIATE MEDICAL CARE IF:   You have heavy bleeding from the vagina.   Your pelvic pain increases.   You feel light-headed or faint.   You have chills.   You have pain with urination or blood in your urine.   You have uncontrolled diarrhea   or vomiting.   You have a fever or persistent symptoms for more than 3 days.  You have a fever and your symptoms suddenly get worse.   You are being physically or sexually abused.  MAKE SURE YOU:  Understand these instructions.  Will watch your condition.  Will get help if you are not doing well or get worse. Document Released:  08/11/2004 Document Revised: 01/29/2014 Document Reviewed: 01/04/2012 ExitCare Patient Information 2015 [employer]. This information is not intended to replace advice given to you by your health care provider. Make sure you discuss any questions you have with your health care provider.  

## 2015-06-25 LAB — GC/CHLAMYDIA PROBE AMP (~~LOC~~) NOT AT ARMC
CHLAMYDIA, DNA PROBE: NEGATIVE
Neisseria Gonorrhea: NEGATIVE

## 2015-06-25 LAB — RPR: RPR: NONREACTIVE

## 2015-06-27 ENCOUNTER — Encounter: Payer: Self-pay | Admitting: Obstetrics & Gynecology

## 2015-07-10 ENCOUNTER — Encounter (HOSPITAL_COMMUNITY): Payer: Self-pay | Admitting: Emergency Medicine

## 2015-07-10 ENCOUNTER — Emergency Department (HOSPITAL_COMMUNITY)
Admission: EM | Admit: 2015-07-10 | Discharge: 2015-07-11 | Disposition: A | Discharge status: Home / Self Care | Payer: Medicaid Other | Attending: Emergency Medicine | Admitting: Emergency Medicine

## 2015-07-10 DIAGNOSIS — H5789 Other specified disorders of eye and adnexa: Secondary | ICD-10-CM

## 2015-07-10 DIAGNOSIS — H5702 Anisocoria: Secondary | ICD-10-CM | POA: Insufficient documentation

## 2015-07-10 DIAGNOSIS — F329 Major depressive disorder, single episode, unspecified: Secondary | ICD-10-CM | POA: Insufficient documentation

## 2015-07-10 DIAGNOSIS — H578 Other specified disorders of eye and adnexa: Secondary | ICD-10-CM | POA: Insufficient documentation

## 2015-07-10 DIAGNOSIS — Z8742 Personal history of other diseases of the female genital tract: Secondary | ICD-10-CM | POA: Insufficient documentation

## 2015-07-10 DIAGNOSIS — Z87891 Personal history of nicotine dependence: Secondary | ICD-10-CM | POA: Insufficient documentation

## 2015-07-10 DIAGNOSIS — E079 Disorder of thyroid, unspecified: Secondary | ICD-10-CM | POA: Insufficient documentation

## 2015-07-10 DIAGNOSIS — G8929 Other chronic pain: Secondary | ICD-10-CM | POA: Insufficient documentation

## 2015-07-10 DIAGNOSIS — Z880 Allergy status to penicillin: Secondary | ICD-10-CM | POA: Insufficient documentation

## 2015-07-10 DIAGNOSIS — H539 Unspecified visual disturbance: Secondary | ICD-10-CM | POA: Insufficient documentation

## 2015-07-10 DIAGNOSIS — M069 Rheumatoid arthritis, unspecified: Secondary | ICD-10-CM | POA: Insufficient documentation

## 2015-07-10 DIAGNOSIS — F419 Anxiety disorder, unspecified: Secondary | ICD-10-CM | POA: Insufficient documentation

## 2015-07-10 DIAGNOSIS — Z79899 Other long term (current) drug therapy: Secondary | ICD-10-CM | POA: Insufficient documentation

## 2015-07-10 MED ORDER — TETRACAINE HCL 0.5 % OP SOLN
1.0000 [drp] | Freq: Once | OPHTHALMIC | Status: AC
  Administered 2015-07-10: 1 [drp] via OPHTHALMIC
  Filled 2015-07-10: qty 2

## 2015-07-10 MED ORDER — FLUORESCEIN SODIUM 1 MG OP STRP
1.0000 | ORAL_STRIP | Freq: Once | OPHTHALMIC | Status: AC
  Administered 2015-07-10: 1 via OPHTHALMIC
  Filled 2015-07-10: qty 1

## 2015-07-10 NOTE — ED Provider Notes (Signed)
CSN: [id number]     Arrival date & time 07/10/15  2319 History  By signing my name below, I, Evon Slack, attest that this documentation has been prepared under the direction and in the presence of [employer], PA-C. Electronically Signed: Evon Slack, ED Scribe. 07/10/2015. 11:57 PM.     Chief Complaint  Patient presents with  . Eye Problem   The history is provided by the patient. No language interpreter was used.   HPI Comments: Dana Elliott is a 34 y.o. female who presents to the Emergency Department complaining of right eye irritation onset 1 day prior. Pt presents with right eye redness and swelling around the right eye. Pt states that her vision in the right eye is intermittently "fuzzy." Pt states that she thinks she may have got some of her eye makeup in her eye. Pt states she washed the eye out with water with slight relief. She states that she thinks she washed all the make up out of her but still was having irritation. Pt states she has tried eye drops with no relief. Pt states that the pain is worse when looking left or right. Pt states she has tried ibuprofen with no relief. Pt denies any drainage or photophobia. Pt states she does not wear glasses or contacts.   Past Medical History  Diagnosis Date  . Thyroid disease   . Abnormal Pap smear   . Allergy   . Depression   . Rheumatoid arthritis(714.0)   . Anxiety   . Ovarian cyst   . PID (acute pelvic inflammatory disease)   . Headache   . Chronic neck pain    Past Surgical History  Procedure Laterality Date  . Tubal ligation    . Laparoscopic tubal ligation     Family History  Problem Relation Age of Onset  . Diabetes Mother   . Hypertension Mother   . Alcohol abuse Paternal Grandmother   . Hypothyroidism Paternal Grandfather   . Other Neg Hx    Social History  Substance Use Topics  . Smoking status: Former Smoker    Quit date: 03/16/2013  . Smokeless tobacco: Never Used  . Alcohol Use: No   OB  History    Gravida Para Term Preterm AB TAB SAB Ectopic Multiple Living   4 2 1 1 2 2    2       Review of Systems  Eyes: Positive for redness and visual disturbance. Negative for discharge.  All other systems reviewed and are negative.    Allergies  Penicillins; Tramadol; Reglan; and Toradol  Home Medications   Prior to Admission medications   Medication Sig Start Date End Date Taking? Authorizing Provider  ibuprofen (ADVIL,MOTRIN) 200 MG tablet Take 600-800 mg by mouth every 6 (six) hours as needed for moderate pain.    Historical Provider, MD  levothyroxine (SYNTHROID, LEVOTHROID) 175 MCG tablet Take 1 tablet (175 mcg total) by mouth daily. For low thyroid function 12/25/14   12/27/14, PA-C  PRESCRIPTION MEDICATION Percocet (states is an old prescription)    Historical Provider, MD  promethazine (PHENERGAN) 25 MG tablet Take 1 tablet (25 mg total) by mouth every 6 (six) hours as needed for nausea or vomiting (or headache). Patient not taking: Reported on 06/24/2015 06/01/15   08/01/15, DO  traMADol (ULTRAM) 50 MG tablet Take 1 tablet (50 mg total) by mouth every 6 (six) hours as needed. Patient taking differently: Take 50 mg by mouth every 6 (six) hours as needed for  moderate pain or severe pain.  12/25/14   Junius Finner, PA-C   BP 117/75 mmHg  Pulse 93  Temp(Src) 97.9 F (36.6 C) (Oral)  Resp 16  SpO2 99%  LMP 06/24/2015 (Approximate)   Physical Exam  Constitutional: She is oriented to person, place, and time. She appears well-developed and well-nourished. No distress.  Nontoxic/nonseptic appearing  HENT:  Head: Normocephalic and atraumatic.  Eyes: EOM are normal. Right eye exhibits no discharge. No foreign body present in the right eye. Left eye exhibits no discharge. Right conjunctiva is injected. No scleral icterus. Right eye exhibits no nystagmus.  Fundoscopic exam:      The right eye shows no hemorrhage. The right eye shows red reflex.  Slit lamp exam:       The right eye shows no corneal abrasion, no corneal flare, no corneal ulcer and no fluorescein uptake.  Anisocoria. Pupils round and reactive b/l. No consensual photophobia. There is some mild direct photophobia. Mild blepharitis of R eye. No eye drainage/discharge. Snellen 20/20 OU, 20/50 OD, 20/25 OS. No proptosis or hyphema. No foreign body visualized. No uptake on fluorescein staining. No corneal abrasion or ulcer noted. Normal EOMs. No nystagmus noted. Negative Seidel's sign.  Neck: Normal range of motion.  Pulmonary/Chest: Effort normal. No respiratory distress.  Respirations even and unlabored  Musculoskeletal: Normal range of motion.  Neurological: She is alert and oriented to person, place, and time. She exhibits normal muscle tone. Coordination normal.  Skin: Skin is warm and dry. No rash noted. She is not diaphoretic. No erythema. No pallor.  Psychiatric: She has a normal mood and affect. Her behavior is normal.  Nursing note and vitals reviewed.   ED Course  Procedures (including critical care time) DIAGNOSTIC STUDIES: Oxygen Saturation is 99% on RA, normal by my interpretation.     COORDINATION OF CARE: 11:43 PM-Discussed treatment plan with pt at bedside and pt agreed to plan.    Labs Review Labs Reviewed - No data to display  Imaging Review No results found.    EKG Interpretation None      0550 - Spoke with Dr. Randon Goldsmith about outpatient follow up. Dr. Randon Goldsmith is able to see the patient in his office between 8:30AM and 10AM today. Will make patient aware.  7017 - Message left on patient's voicemail regarding instructions for outpatient follow up with Dr. Randon Goldsmith. Have recommended that the patient call his office for any additional questions. MDM   Final diagnoses:  Irritation of right eye    34 year old female presents to the emergency department for further evaluation of right eye irritation. Patient reports feeling as though she had a foreign body on her cornea  yesterday. Patient had flushed her eye and felt as though she had removed this foreign body, but continued to experience irritation to the right eye. No foreign body visualized on exam with the ophthalmoscope or slit lamp. Fluorescein stain completed which shows no evidence of obvious uptake to suggest corneal abrasion or ulcer. There is no dendritic staining. No proptosis or hyphema. EOMs normal.  Patient placed on erythromycin ointment for lubrication and infection coverage. Will refer to ophthalmology for outpatient recheck. Patient given referral to Dr. Randon Goldsmith. Return precautions discussed and provided. Patient agreeable to plan with no unaddressed concerns. Patient discharged in good condition; VSS.  I, Emerita Berkemeier, personally performed the services described in this documentation. All medical record entries made by the scribe were at my direction and in my presence.  I have reviewed the chart  and discharge instructions and agree that the record reflects my personal performance and is accurate and complete. Moise Friday.  07/11/2015. 5:51 AM.    Ceasar Mons Vitals:   07/10/15 2355  BP: 117/75  Pulse: 93  Temp: 97.9 F (36.6 C)  TempSrc: Oral  Resp: 16  SpO2: 99%        Antony Madura, PA-C 07/11/15 0559  Derwood Kaplan, MD 07/19/15 1524

## 2015-07-10 NOTE — ED Notes (Signed)
Pt reports right eye irration starting last night. Thinks her make-up (glittery eyeshadow) got into her eye. Took Ibuprofen before coming here. Has not been around anyone with conjunctivitis. Says when she covers her left eye, "it looks like there is cling wrap over my eye." No drainage noted in right eye. No other c/c.

## 2015-07-11 MED ORDER — ERYTHROMYCIN 5 MG/GM OP OINT
1.0000 "application " | TOPICAL_OINTMENT | Freq: Once | OPHTHALMIC | Status: AC
  Administered 2015-07-11: 1 via OPHTHALMIC
  Filled 2015-07-11: qty 3.5

## 2015-07-11 NOTE — ED Notes (Signed)
avs explained in detail. Knows to follow up with opthalmologist ASAP. Shown proper application for erythromycin ointment.

## 2015-07-11 NOTE — Discharge Instructions (Signed)
It is likely that you had a foreign body in your eye that caused your persistent irritation. Use erythromycin ointment for lubrication and to prevent infection; Place 1/2 inch ribbon of ointment in the affected eye 4 times a day. Take ibuprofen for pain. Follow up with Dr. Randon Goldsmith in the morning for further evaluation of your symptoms. Return to the ED as needed if symptoms worsen.  Eye Foreign Body A foreign body refers to any object on the surface of the eye or in the eyeball that should not be there. A foreign body may be a small speck of dirt or dust, a hair or eyelash, a splinter, or any other object.  SIGNS AND SYMPTOMS Symptoms depend on what the foreign body is and where it is in the eye. The most common locations are:   On the inner surface of the upper or lower eyelids or on the covering of the white part of the eye (conjunctiva). Symptoms in this location are:  Pain and irritation, especially when blinking.  The feeling that something is in the eye.  On the surface of the clear covering on the front of the eye (cornea). Symptoms in this location include:  Pain and irritation.   Small "rust rings" around a metallic foreign body.  The feeling that something is in the eye.   Inside the eyeball. Foreign bodies inside the eye may cause:   Great pain.   Immediate loss of vision.   Distortion of the pupil. DIAGNOSIS  Foreign bodies are found during an exam by an eye specialist. Those on the eyelids, conjunctiva, or cornea are usually (but not always) easily found. When a foreign body is inside the eyeball, a cloudiness of the lens (cataract) may form almost right away. This makes it hard for an eye specialist to find the foreign body. Tests may be needed, including ultrasound testing, X-rays, and CT scans. TREATMENT   Foreign bodies on the eyelids, conjunctiva, or cornea are often removed easily and painlessly.  Rust in the cornea may require the use of a drill-like  instrument to remove the rust.  If the foreign body has caused a scratch or a rubbing or scraping (abrasion) of the cornea, this may be treated with antibiotic drops or ointment. A pressure patch may be put over your eye.  If the foreign body is inside your eyeball, surgery is needed right away. This is a medical emergency. Foreign bodies inside the eye threaten vision. A person may even lose his or her eye. HOME CARE INSTRUCTIONS   Take medicines only as directed by your health care provider. Use eye drops or ointment as directed.  If no eye patch was applied:  Keep your eye closed as much as possible.  Do not rub your eye.  Wear dark glasses as needed to protect your eyes from bright light.  Do not wear contact lenses until your eye feels normal again, or as instructed by your health care provider.  Wear a protective eye covering if there is a risk of eye injury. This is important when working with high-speed tools.  If your eye is patched:  Follow your health care provider's instructions for when to remove the patch.  Do notdrive or operate machinery if your eye is patched. Your ability to judge distances is impaired.  Keep all follow-up visits as directed by your health care provider. This is important. SEEK MEDICAL CARE IF:   You have increased pain in your eye.  Your vision gets worse.  You have problems with your eye patch.   You have fluid (discharge) coming from your injured eye.   You have redness and swelling around your affected eye.  MAKE SURE YOU:   Understand these instructions.  Will watch your condition.  Will get help right away if you are not doing well or get worse.   This information is not intended to replace advice given to you by your health care provider. Make sure you discuss any questions you have with your health care provider.   Document Released: 09/14/2005 Document Revised: 10/05/2014 Document Reviewed: 02/09/2013 Elsevier  Interactive Patient Education [employer]

## 2015-07-17 ENCOUNTER — Encounter: Payer: Self-pay | Admitting: Obstetrics & Gynecology

## 2015-07-28 ENCOUNTER — Emergency Department (HOSPITAL_BASED_OUTPATIENT_CLINIC_OR_DEPARTMENT_OTHER)
Admission: EM | Admit: 2015-07-28 | Discharge: 2015-07-28 | Discharge status: Against Medical Advice | Payer: Medicaid Other | Attending: Emergency Medicine | Admitting: Emergency Medicine

## 2015-07-28 ENCOUNTER — Encounter (HOSPITAL_BASED_OUTPATIENT_CLINIC_OR_DEPARTMENT_OTHER): Payer: Self-pay | Admitting: Emergency Medicine

## 2015-07-28 DIAGNOSIS — E079 Disorder of thyroid, unspecified: Secondary | ICD-10-CM | POA: Insufficient documentation

## 2015-07-28 DIAGNOSIS — N939 Abnormal uterine and vaginal bleeding, unspecified: Secondary | ICD-10-CM | POA: Insufficient documentation

## 2015-07-28 DIAGNOSIS — Z79899 Other long term (current) drug therapy: Secondary | ICD-10-CM | POA: Insufficient documentation

## 2015-07-28 DIAGNOSIS — Z880 Allergy status to penicillin: Secondary | ICD-10-CM | POA: Insufficient documentation

## 2015-07-28 DIAGNOSIS — G8929 Other chronic pain: Secondary | ICD-10-CM | POA: Insufficient documentation

## 2015-07-28 DIAGNOSIS — F419 Anxiety disorder, unspecified: Secondary | ICD-10-CM | POA: Insufficient documentation

## 2015-07-28 DIAGNOSIS — Z765 Malingerer [conscious simulation]: Secondary | ICD-10-CM | POA: Insufficient documentation

## 2015-07-28 DIAGNOSIS — Z8742 Personal history of other diseases of the female genital tract: Secondary | ICD-10-CM | POA: Insufficient documentation

## 2015-07-28 DIAGNOSIS — Z87891 Personal history of nicotine dependence: Secondary | ICD-10-CM | POA: Insufficient documentation

## 2015-07-28 DIAGNOSIS — M069 Rheumatoid arthritis, unspecified: Secondary | ICD-10-CM | POA: Insufficient documentation

## 2015-07-28 DIAGNOSIS — R102 Pelvic and perineal pain: Secondary | ICD-10-CM

## 2015-07-28 MED ORDER — ONDANSETRON HCL 4 MG/2ML IJ SOLN: 4.0000 mg | Freq: Once | INTRAMUSCULAR | Status: DC

## 2015-07-28 MED ORDER — SODIUM CHLORIDE 0.9 % IV BOLUS (SEPSIS): 1000.0000 mL | Freq: Once | INTRAVENOUS | Status: DC

## 2015-07-28 MED ORDER — ONDANSETRON 8 MG PO TBDP
8.0000 mg | ORAL_TABLET | Freq: Once | ORAL | Status: AC
  Administered 2015-07-28: 8 mg via ORAL
  Filled 2015-07-28: qty 1

## 2015-07-28 NOTE — ED Notes (Signed)
Patient refusing any treatment at this time other than ODT Zofran. The patient reported to the MD that she had someone drop her off and then she was going to have her husband pick her up. However the patient drove off in her own car visualized by security after signing out AMA because she did not want the treatment that was ordered by the MD.

## 2015-07-28 NOTE — ED Notes (Signed)
Patient reports that she is having a hysterectomy in 2 days and the pain from her abdominal cramping and vaginal bleeding is worse tonight. Was told by [facility] and states that she could not make it there because of the pain

## 2015-07-28 NOTE — ED Provider Notes (Signed)
CSN: [id number]     Arrival date & time 07/28/15  0142 History   First MD Initiated Contact with Patient 07/28/15 [phone]     Chief Complaint  Patient presents with  . Vaginal Bleeding     (Consider location/radiation/quality/duration/timing/severity/associated sxs/prior Treatment) HPI Comments: Patient is a 34 year old female with history of recurrent vaginal bleeding. She tells me she has a hysterectomy scheduled in 2 days at [facility]. She began with lower abdominal cramping and bleeding again 2 days ago and presents here with worsening pain. She tells me she was told by her OB to go to the [facility], however came here because she reports being in too much pain. She denies any fevers or chills.  Patient is a 34 y.o. female presenting with vaginal bleeding. The history is provided by the patient.  Vaginal Bleeding Quality:  Clots Severity:  Moderate Onset quality:  Gradual Duration:  2 days Timing:  Constant Progression:  Worsening Chronicity:  Recurrent Relieved by:  Nothing Worsened by:  Nothing tried Ineffective treatments:  None tried Associated symptoms: abdominal pain     Past Medical History  Diagnosis Date  . Thyroid disease   . Abnormal Pap smear   . Allergy   . Depression   . Rheumatoid arthritis(714.0)   . Anxiety   . Ovarian cyst   . PID (acute pelvic inflammatory disease)   . Headache   . Chronic neck pain    Past Surgical History  Procedure Laterality Date  . Tubal ligation    . Laparoscopic tubal ligation     Family History  Problem Relation Age of Onset  . Diabetes Mother   . Hypertension Mother   . Alcohol abuse Paternal Grandmother   . Hypothyroidism Paternal Grandfather   . Other Neg Hx    Social History  Substance Use Topics  . Smoking status: Former Smoker    Quit date: 03/16/2013  . Smokeless tobacco: Never Used  . Alcohol Use: No   OB History    Gravida Para Term Preterm AB TAB SAB Ectopic Multiple Living   4 2 1 1 2 2    2       Review of Systems  Gastrointestinal: Positive for abdominal pain.  Genitourinary: Positive for vaginal bleeding.  All other systems reviewed and are negative.     Allergies  Penicillins; Tramadol; Reglan; and Toradol  Home Medications   Prior to Admission medications   Medication Sig Start Date End Date Taking? Authorizing Provider  ibuprofen (ADVIL,MOTRIN) 200 MG tablet Take 600-800 mg by mouth every 6 (six) hours as needed for moderate pain.    Historical Provider, MD  levothyroxine (SYNTHROID, LEVOTHROID) 175 MCG tablet Take 1 tablet (175 mcg total) by mouth daily. For low thyroid function 12/25/14   12/27/14, PA-C  PRESCRIPTION MEDICATION Percocet (states is an old prescription)    Historical Provider, MD  promethazine (PHENERGAN) 25 MG tablet Take 1 tablet (25 mg total) by mouth every 6 (six) hours as needed for nausea or vomiting (or headache). Patient not taking: Reported on 06/24/2015 06/01/15   08/01/15, DO  traMADol (ULTRAM) 50 MG tablet Take 1 tablet (50 mg total) by mouth every 6 (six) hours as needed. Patient taking differently: Take 50 mg by mouth every 6 (six) hours as needed for moderate pain or severe pain.  12/25/14   12/27/14, PA-C   BP 135/93 mmHg  Pulse 122  Temp(Src) 98 F (36.7 C) (Oral)  Resp 20  Ht 5\' 6"  (  1.676 m)  Wt 190 lb (86.183 kg)  BMI 30.68 kg/m2  SpO2 100%  LMP 07/28/2015 Physical Exam  Constitutional: She is oriented to person, place, and time. She appears well-developed and well-nourished. No distress.  HENT:  Head: Normocephalic and atraumatic.  Neck: Normal range of motion. Neck supple.  Cardiovascular: Normal rate and regular rhythm.  Exam reveals no gallop and no friction rub.   No murmur heard. Pulmonary/Chest: Effort normal and breath sounds normal. No respiratory distress. She has no wheezes.  Abdominal: Soft. Bowel sounds are normal. She exhibits no distension. There is tenderness. There is no rebound and no  guarding.  There is tenderness to palpation of the lower abdomen. There is no rebound or guarding.  Musculoskeletal: Normal range of motion.  Neurological: She is alert and oriented to person, place, and time.  Skin: Skin is warm and dry. She is not diaphoretic.  Nursing note and vitals reviewed.   ED Course  Procedures (including critical care time) Labs Review Labs Reviewed  CBC WITH DIFFERENTIAL/PLATELET  BASIC METABOLIC PANEL    Imaging Review No results found. I have personally reviewed and evaluated these images and lab results as part of my medical decision-making.   EKG Interpretation None      MDM   Final diagnoses:  None    This patient is a 34 year old female who presents with some regularity to our facility. She presents today complaining of increased pain and bleeding from her vagina. She reports a uterine condition for which she is to undergo a hysterectomy in the next several days at [facility]. She tells me her pain increased this evening and is having difficulty tolerating her medications and fluids.  She appears well-hydrated and in no acute distress. My plan was to initiate IV fluids, treat her nausea, and evaluate her laboratory studies to determine if she is becoming anemic. She was informed of this plan and I excused myself from the exam room. She then informed the nurse that she did not want this treatment, she only wanted a dose of Zofran that she can take her home medications. She then signed out AGAINST MEDICAL ADVICE and left the emergency department.  Of note is that this patient has filled 6 prescriptions for narcotic medications in the past 30 days, each from a different provider and from multiple pharmacies. She is also been here many times requesting refills of pain medication. I suspect there is a component of drug seeking behavior. I believe she left this evening because I did not offer additional pain medication in the emergency  department.  Also of note is that she told me her brother dropped her off here for treatment and that her husband was picking her up. After she left the emergency department, she was witnessed getting into her car and driving away. I believe she lied to me because she is aware of our policy that she cannot drive after receiving narcotic medications and was hoping to receive these initially when she came here.    Geoffery Lyons, MD 07/28/15 [phone]

## 2015-10-16 ENCOUNTER — Emergency Department (HOSPITAL_BASED_OUTPATIENT_CLINIC_OR_DEPARTMENT_OTHER)
Admission: EM | Admit: 2015-10-16 | Discharge: 2015-10-16 | Disposition: A | Discharge status: Home / Self Care | Payer: Medicaid Other | Attending: Emergency Medicine | Admitting: Emergency Medicine

## 2015-10-16 ENCOUNTER — Emergency Department (HOSPITAL_COMMUNITY)
Admission: EM | Admit: 2015-10-16 | Discharge: 2015-10-16 | Discharge status: Against Medical Advice | Payer: Medicaid Other | Attending: Emergency Medicine | Admitting: Emergency Medicine

## 2015-10-16 ENCOUNTER — Encounter (HOSPITAL_COMMUNITY): Payer: Self-pay | Admitting: Emergency Medicine

## 2015-10-16 ENCOUNTER — Encounter (HOSPITAL_BASED_OUTPATIENT_CLINIC_OR_DEPARTMENT_OTHER): Payer: Self-pay | Admitting: Emergency Medicine

## 2015-10-16 ENCOUNTER — Emergency Department (HOSPITAL_BASED_OUTPATIENT_CLINIC_OR_DEPARTMENT_OTHER): Payer: Medicaid Other

## 2015-10-16 DIAGNOSIS — G8929 Other chronic pain: Secondary | ICD-10-CM | POA: Insufficient documentation

## 2015-10-16 DIAGNOSIS — Z79899 Other long term (current) drug therapy: Secondary | ICD-10-CM | POA: Insufficient documentation

## 2015-10-16 DIAGNOSIS — S6991XA Unspecified injury of right wrist, hand and finger(s), initial encounter: Secondary | ICD-10-CM | POA: Insufficient documentation

## 2015-10-16 DIAGNOSIS — Z880 Allergy status to penicillin: Secondary | ICD-10-CM | POA: Insufficient documentation

## 2015-10-16 DIAGNOSIS — Y998 Other external cause status: Secondary | ICD-10-CM | POA: Insufficient documentation

## 2015-10-16 DIAGNOSIS — Z8739 Personal history of other diseases of the musculoskeletal system and connective tissue: Secondary | ICD-10-CM | POA: Insufficient documentation

## 2015-10-16 DIAGNOSIS — M79642 Pain in left hand: Secondary | ICD-10-CM

## 2015-10-16 DIAGNOSIS — Y9389 Activity, other specified: Secondary | ICD-10-CM | POA: Insufficient documentation

## 2015-10-16 DIAGNOSIS — Z87891 Personal history of nicotine dependence: Secondary | ICD-10-CM | POA: Insufficient documentation

## 2015-10-16 DIAGNOSIS — F329 Major depressive disorder, single episode, unspecified: Secondary | ICD-10-CM | POA: Insufficient documentation

## 2015-10-16 DIAGNOSIS — S30814A Abrasion of vagina and vulva, initial encounter: Secondary | ICD-10-CM | POA: Insufficient documentation

## 2015-10-16 DIAGNOSIS — Z8742 Personal history of other diseases of the female genital tract: Secondary | ICD-10-CM | POA: Insufficient documentation

## 2015-10-16 DIAGNOSIS — E079 Disorder of thyroid, unspecified: Secondary | ICD-10-CM | POA: Insufficient documentation

## 2015-10-16 DIAGNOSIS — Z8711 Personal history of peptic ulcer disease: Secondary | ICD-10-CM | POA: Insufficient documentation

## 2015-10-16 DIAGNOSIS — Y9289 Other specified places as the place of occurrence of the external cause: Secondary | ICD-10-CM | POA: Insufficient documentation

## 2015-10-16 DIAGNOSIS — F1310 Sedative, hypnotic or anxiolytic abuse, uncomplicated: Secondary | ICD-10-CM | POA: Insufficient documentation

## 2015-10-16 DIAGNOSIS — M069 Rheumatoid arthritis, unspecified: Secondary | ICD-10-CM | POA: Insufficient documentation

## 2015-10-16 DIAGNOSIS — F419 Anxiety disorder, unspecified: Secondary | ICD-10-CM | POA: Insufficient documentation

## 2015-10-16 DIAGNOSIS — T7421XA Adult sexual abuse, confirmed, initial encounter: Secondary | ICD-10-CM | POA: Insufficient documentation

## 2015-10-16 LAB — URINALYSIS, ROUTINE W REFLEX MICROSCOPIC
Bilirubin Urine: NEGATIVE
Glucose, UA: NEGATIVE mg/dL
Ketones, ur: NEGATIVE mg/dL
Nitrite: NEGATIVE
Protein, ur: NEGATIVE mg/dL
Specific Gravity, Urine: 1.018 (ref 1.005–1.030)
pH: 5.5 (ref 5.0–8.0)

## 2015-10-16 LAB — RAPID URINE DRUG SCREEN, HOSP PERFORMED
AMPHETAMINES: NOT DETECTED
BARBITURATES: NOT DETECTED
Benzodiazepines: POSITIVE — AB
Cocaine: NOT DETECTED
Opiates: NOT DETECTED
TETRAHYDROCANNABINOL: NOT DETECTED

## 2015-10-16 LAB — URINE MICROSCOPIC-ADD ON

## 2015-10-16 MED ORDER — IBUPROFEN 800 MG PO TABS
800.0000 mg | ORAL_TABLET | Freq: Once | ORAL | Status: AC
  Administered 2015-10-16: 800 mg via ORAL
  Filled 2015-10-16: qty 1

## 2015-10-16 MED ORDER — ACETAMINOPHEN 325 MG PO TABS
650.0000 mg | ORAL_TABLET | Freq: Once | ORAL | Status: DC
  Filled 2015-10-16: qty 2

## 2015-10-16 NOTE — ED Notes (Signed)
Pt refused VS to be taken and refused to sign for discharge instructions. Pt given cab voucher to take her to [facility] per request.
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
## 2015-10-16 NOTE — ED Notes (Signed)
Pt walking outside of room. Pt instructed to stay in her room. Pt repeatedly asks for anxiety medication. Instructed that MD was aware. Pt states she is claustrophobic and can't stay in room. Pt Instructed to go back to room and leave door open. Pt goes back to room slowly. Pt reminded the MD needs a urine specimen. Pt states she is unable to void.

## 2015-10-16 NOTE — ED Notes (Signed)
Itts calling in to talk with pt.
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
## 2015-10-16 NOTE — ED Notes (Signed)
Went to give tylenol and pt has left AMA.

## 2015-10-16 NOTE — ED Notes (Signed)
Pt states she will have a seizure if she doesn't get her tramadol. Instructed pt I would inform MD.
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
## 2015-10-16 NOTE — ED Notes (Signed)
Pt standing in hallway states she doesn't feel well and can't sit in room. Pt instructed that she needs to stay in room for other pt confidentiality. Pt moved back in to room.
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
## 2015-10-16 NOTE — ED Notes (Signed)
Walked with pt to xray and back. Pt tolerated well.
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
## 2015-10-16 NOTE — Discharge Instructions (Signed)
Ice and Motrin for hand pain  Follow up with outpatient resources given to you as needed for any ongoing mental health care   Emergency Department Resource Guide 1) Find a Doctor and Pay Out of Pocket Although you won't have to find out who is covered by your insurance plan, it is a good idea to ask around and get recommendations. You will then need to call the office and see if the doctor you have chosen will accept you as a new patient and what types of options they offer for patients who are self-pay. Some doctors offer discounts or will set up payment plans for their patients who do not have insurance, but you will need to ask so you aren't surprised when you get to your appointment.  2) Contact Your Local Health Department Not all health departments have doctors that can see patients for sick visits, but many do, so it is worth a call to see if yours does. If you don't know where your local health department is, you can check in your phone book. The CDC also has a tool to help you locate your state's health department, and many state websites also have listings of all of their local health departments.  3) Find a Walk-in Clinic If your illness is not likely to be very severe or complicated, you may want to try a walk in clinic. These are popping up all over the country in pharmacies, drugstores, and shopping centers. They're usually staffed by nurse practitioners or physician assistants that have been trained to treat common illnesses and complaints. They're usually fairly quick and inexpensive. However, if you have serious medical issues or chronic medical problems, these are probably not your best option.  No Primary Care Doctor: - Call Health Connect at  [phone] - they can help you locate a primary care doctor that  accepts your insurance, provides certain services, etc. - Physician Referral Service- [phone]  Chronic Pain Problems: Organization         Address  Phone    Notes  [facility]  [phone] Patients need to be referred by their primary care doctor.   Medication Assistance: Organization         Address  Phone   Notes  [facility] Medication [facility] [address] [place] [place], [place] [address] [phone] --Must be a resident of [facility] -- Must have NO insurance coverage whatsoever (no Medicaid/ Medicare, etc.) -- The pt. MUST have a primary care doctor that directs their care regularly and follows them in the community   [facility]ist  [phone]   [employer]  [phone]    Agencies that provide inexpensive medical care: Organization         Address  Phone   Notes  [facility]  [phone]   [facility]    [phone]   [facility] [address] [place], [place] [address] [phone]   [facility] [address] [place] [place] [phone]   Planned Parenthood    [phone]   [facility]    [phone]   Community Health and [facility]  [address], [place] Phone:  [phone], Fax:  [phone] Hours of Operation:  9 am - 6 pm, M-F.  Also accepts Medicaid/Medicare and self-pay.  [facility]  [address], [employer] Phone: [phone])  [phone], Fax: [phone]. Hours of Operation:  8:30 am - 5:30 pm, M-F.  Also accepts Medicaid and self-pay.  [facility] [place] [address] [place] Phone: [phone]   [place], [place], [place] [phone], Ext. [phone] Mondays & Thursdays: 7-9 AM.  First 15 patients are seen on a first come, first serve basis.    [place] Providers:  Organization         Address  Phone   Notes  [facility] [address], [place] [phone] Also accepts self-pay patients.  [facility]  [id number] [place], [place]  [phone]   [place], [address], [place] [phone]   [facility] [address] [place] [phone]   Lucianne Lei [address], [place]   [phone] Only accepts [place] Access [place] patients after they have their name applied to their card.   Self-Pay (no insurance) in [facility]:  Organization         Address  Phone   Notes  Sickle Cell Patients, [facility] Internal Medicine [place] [phone]   [facility] Urgent Care [place] [phone]   [facility] [place]  [place], [place], [place] [phone]   [facility]/Dr. Osei-Bonsu  [address], [place] or [address], [place] [phone] Phone number for both Dent and [place] locations is the same.  Urgent Medical and [facility] [address], [place] [phone]   [facility] [address] [place] or [address] [phone]   [facility] [address] [place] [phone], phone; [phone], fax Sees patients 1st and 3rd Saturday of every month.  Must not qualify for public or private insurance (i.e. Medicaid, Medicare, [place] Health Choice, Veterans' Benefits)  Household income should be no more than 200% of the poverty level The clinic cannot treat you if you are pregnant or think you are pregnant  Sexually transmitted diseases are not treated at the clinic.    Dental Care: Organization         Address  Phone  Notes  [facility] Department of [facility] [place] [phone] Accepts children up to age 49 who are enrolled in [place] or [place]; pregnant women with a Medicaid card; and children who have  applied for Medicaid or [place] Health Choice, but were declined, whose parents can pay a reduced fee at time of service.  [facility] Department of [facility]  [address], [place] [phone] Accepts children up to age 43 who are enrolled in [place] or [place]; pregnant women with a Medicaid card; and children who have applied for Medicaid or [place] Health Choice, but were declined, whose parents can pay a reduced fee at time of service.  [facility]  [place] [phone] Patients are seen by appointment only. Walk-ins are not accepted. [place] will see patients 75 years of age and older. Monday - Tuesday (8am-5pm) Most Wednesdays (8:30-5pm) $30 per visit, cash only  [facility]  [address], [employer]  [place] [phone] Patients are seen by appointment only. Walk-ins are not accepted. [place] will see patients 52 years of age and older. One Wednesday Evening (Monthly: Volunteer Based).  $30 per visit, cash only  [place]  [phone] for adults; Children under age 58, call Graduate Pediatric Dentistry at [phone]. Children aged 77-14, please call [phone] to request a pediatric application.  Dental services are provided in all areas of dental care including fillings, crowns and bridges, complete and partial dentures, implants, gum treatment, root canals, and extractions. Preventive care is also provided. Treatment is provided to both adults and children. Patients are selected via a lottery and there is often a waiting list.   [facility] [address], [place]  [phone] [email or web address]   [facility] [address] [place], [place] [phone], Ext. [phone] Second and Fourth Thursday of each month, opens at 6:30 AM; Clinic ends at 9 AM.  Patients are seen on a first-come first-served basis, and a  limited number are seen during each clinic.   [facility]  [address] Hillard Danker [place], [place] [phone]   Eligibility Requirements You must have lived in [place], [place], or [place] counties for at least the last three months.   You cannot be eligible for state or federal sponsored [employer], including [employer], [place], or [employer].   You generally cannot be eligible for healthcare insurance through your employer.    How to apply: Eligibility screenings are held every Tuesday and Wednesday afternoon from 1:00 pm until 4:00 pm. You do not need an appointment for the interview!  [facility] [address], [place], [place]   [place]  [place] Department  [place]  [phone]    Behavioral Health Resources in the Community: Intensive Outpatient Programs Organization         Address  Phone  Notes  [place]. [address], [place], [place] [phone]   [facility] [address] [place], [place]   [facility]: [facility] [address] [place], [place]   [place] [address]  [place], [place] or [phone]   Substance Abuse Resources Organization         Address  Phone  Notes  Alcohol and Drug Services  [phone]   [place]  [phone]   [facility] [place]   Chinita Pester  [phone]   Residential & Outpatient Substance Abuse Program  [phone]   Psychological Services Organization         Address  Phone  Notes  [facility] [place]  [place]  [phone]   [place] [address] [place] or [phone]    Mobile Crisis Teams Organization          Address  Phone  Notes  [facility], Mobile Crisis Care Unit  [phone]   [facility]  [address] [place], [place]   [place] [address] [place] [phone]    Self-Help/Support Groups Organization         Address  Phone             Notes  [place]. of [place] - variety of support groups  [phone]- [phone] Call for more information  Narcotics Anonymous (NA), [facility] [address], [employer] [place]  2 meetings at this location   Residential Facilities manager         Address  Phone  Notes  [facility] [place],    [place]  [phone]   [facility]  [address], [place] [id number], [place], [place]   [place], [place] [phone] Admissions: 8am-3pm M-F  Incentives Substance [place] [address]    [employer], [place] [phone]   [facility] [address] [place], [place], [place]   [facility] [address],  [place], [place]   [facility] - Intensive Outpatient [address] Kristeen [address], [place], [place]   [facility] ([place].,  [place], [place] [phone] or [phone]   [facility] (RT[facility]) [address], [place], [place] Accepts Medicaid  [facility] [address]  [place] [phone] Substance Abuse/Addiction Treatment   [facility] Organization         Address  Phone  Notes  [facility]  [phone]   Domenic Schwab, PhD [address] Arlis Porta [place], [place]   [phone] or [phone]   [place], [place] [phone]   [facility] [address], [place], [place] [phone] Insurance/Medicaid/sponsorship  through [facility] [address]                                    [place], [place] [phone] [place] [address], [place] [phone]    Dr. Adele Schilder  [phone]   [facility] Dept. 1) [address] [place] 2) [place] 3)  [place] [address], [place] [phone]  [phone]   [place] [phone] or [phone] (After Hours)

## 2015-12-17 ENCOUNTER — Emergency Department (HOSPITAL_COMMUNITY)
Admission: EM | Admit: 2015-12-17 | Discharge: 2015-12-17 | Disposition: A | Discharge status: Home / Self Care | Payer: MEDICAID | Attending: Emergency Medicine | Admitting: Emergency Medicine

## 2015-12-17 ENCOUNTER — Encounter (HOSPITAL_COMMUNITY): Payer: Self-pay | Admitting: Emergency Medicine

## 2015-12-17 DIAGNOSIS — F329 Major depressive disorder, single episode, unspecified: Secondary | ICD-10-CM | POA: Insufficient documentation

## 2015-12-17 DIAGNOSIS — R000 Tachycardia, unspecified: Secondary | ICD-10-CM | POA: Insufficient documentation

## 2015-12-17 DIAGNOSIS — F1120 Opioid dependence, uncomplicated: Secondary | ICD-10-CM | POA: Insufficient documentation

## 2015-12-17 DIAGNOSIS — Z87891 Personal history of nicotine dependence: Secondary | ICD-10-CM | POA: Insufficient documentation

## 2015-12-17 DIAGNOSIS — G8929 Other chronic pain: Secondary | ICD-10-CM | POA: Insufficient documentation

## 2015-12-17 DIAGNOSIS — M069 Rheumatoid arthritis, unspecified: Secondary | ICD-10-CM | POA: Insufficient documentation

## 2015-12-17 DIAGNOSIS — R102 Pelvic and perineal pain: Secondary | ICD-10-CM | POA: Insufficient documentation

## 2015-12-17 MED ORDER — OXYCODONE-ACETAMINOPHEN 5-325 MG PO TABS
1.0000 | ORAL_TABLET | Freq: Once | ORAL | Status: AC
  Administered 2015-12-17: 1 via ORAL
  Filled 2015-12-17: qty 1

## 2015-12-17 NOTE — ED Provider Notes (Signed)
CSN: [id number]     Arrival date & time 12/17/15  0430 History   First MD Initiated Contact with Patient 12/17/15 [phone]     Chief Complaint  Patient presents with  . Medication Refill     (Consider location/radiation/quality/duration/timing/severity/associated sxs/prior Treatment) HPI  patient is here states she ran out of her oxycodone for her chronic pelvic pain. She states she had to bladder biopsies done recently. She states she was diagnosed with interstitial cystitis. She received #75 tablets on February 27. She was seen at her PCP office at that time and they stated she would need to come back to the office to get more refills. She also reports she's been on tramadol for at least 8-9 years. She is being treated for chronic pain. She reports she has run out of her Percocet and she now is having nausea without vomiting and having some diarrhea. She points out to me that the oxycodone as written "not when necessary but to take on a regular basis". She also states they've gone up on her tramadol because when she tries to come off of it she has seizures.  PCP Dr. Wynelle Link at [facility] in [place] in [place].  Past Medical History  Diagnosis Date  . Thyroid disease   . Abnormal Pap smear   . Allergy   . Depression   . Rheumatoid arthritis(714.0)   . Anxiety   . Ovarian cyst   . PID (acute pelvic inflammatory disease)   . Headache   . Chronic neck pain    Past Surgical History  Procedure Laterality Date  . Tubal ligation    . Laparoscopic tubal ligation    . Abdominal hysterectomy     Family History  Problem Relation Age of Onset  . Diabetes Mother   . Hypertension Mother   . Alcohol abuse Paternal Grandmother   . Hypothyroidism Paternal Grandfather   . Other Neg Hx    Social History  Substance Use Topics  . Smoking status: Former Smoker    Quit date: 03/16/2013  . Smokeless tobacco: Never Used  . Alcohol Use: No   employed  OB History    Gravida Para  Term Preterm AB TAB SAB Ectopic Multiple Living   4 2 1 1 2 2    2      Review of Systems  All other systems reviewed and are negative.     Allergies  Penicillins; Tramadol; Reglan; and Toradol  Home Medications   Prior to Admission medications   Medication Sig Start Date End Date Taking? Authorizing Provider  azaTHIOprine (IMURAN) 50 MG tablet Take 50 mg by mouth daily. 09/12/15   Historical Provider, MD  ibuprofen (ADVIL,MOTRIN) 200 MG tablet Take 600-800 mg by mouth every 6 (six) hours as needed for moderate pain.    Historical Provider, MD  levothyroxine (SYNTHROID, LEVOTHROID) 175 MCG tablet Take 1 tablet (175 mcg total) by mouth daily. For low thyroid function 12/25/14   Junius Finner, PA-C  PRESCRIPTION MEDICATION Percocet (states is an old prescription)    Historical Provider, MD  promethazine (PHENERGAN) 25 MG tablet Take 1 tablet (25 mg total) by mouth every 6 (six) hours as needed for nausea or vomiting (or headache). Patient not taking: Reported on 06/24/2015 06/01/15   Samuel Jester, DO  traMADol (ULTRAM) 50 MG tablet Take 1 tablet (50 mg total) by mouth every 6 (six) hours as needed. Patient taking differently: Take 100-150 mg by mouth every 4 (four) hours as needed for moderate  pain or severe pain.  12/25/14   Junius Finner, PA-C   BP 107/90 mmHg  Pulse 134  Temp(Src) 98 F (36.7 C) (Oral)  Resp 20  Ht 5\' 6"  (1.676 m)  Wt 190 lb (86.183 kg)  BMI 30.68 kg/m2  SpO2 100%  LMP 07/28/2015  Vital signs normal except for tachycardia  Physical Exam  Constitutional: She is oriented to person, place, and time. She appears well-developed and well-nourished.  Non-toxic appearance. She does not appear ill. No distress.  HENT:  Head: Normocephalic and atraumatic.  Right Ear: External ear normal.  Left Ear: External ear normal.  Nose: Nose normal. No mucosal edema or rhinorrhea.  Mouth/Throat: Oropharynx is clear and moist and mucous membranes are normal. No dental  abscesses or uvula swelling.  Eyes: Conjunctivae and EOM are normal. Pupils are equal, round, and reactive to light.  Neck: Normal range of motion and full passive range of motion without pain. Neck supple.  Cardiovascular: Regular rhythm and normal heart sounds.  Tachycardia present.  Exam reveals no gallop and no friction rub.   No murmur heard. Pulmonary/Chest: Effort normal and breath sounds normal. No respiratory distress. She has no wheezes. She has no rhonchi. She has no rales. She exhibits no tenderness and no crepitus.  Abdominal: Soft. Normal appearance and bowel sounds are normal. She exhibits no distension. There is no tenderness. There is no rebound and no guarding.  Musculoskeletal: Normal range of motion. She exhibits no edema or tenderness.  Moves all extremities well.   Neurological: She is alert and oriented to person, place, and time. She has normal strength. No cranial nerve deficit.  Skin: Skin is warm, dry and intact. No rash noted. No erythema. No pallor.  Psychiatric: She has a normal mood and affect. Her speech is normal and behavior is normal. Her mood appears not anxious.  Nursing note and vitals reviewed.   ED Course  Procedures (including critical care time)  Medications  oxyCODONE-acetaminophen (PERCOCET/ROXICET) 5-325 MG per tablet 1 tablet (not administered)    I am concerned about the amount of narcotics this patient has been getting in the last 6 months and sounds like several years. She is very neatly dressed and appears to be educated. However she appears to have a severe opiate dependence. She also is showing some narcotic seeking behavior. I have explained her I will give her 1 Percocet here in the ED, however it is her responsibility to follow up with her pain management doctor so that she does not run out of her needed medications. Patient has some symptoms of narcotic withdrawal including tachycardia and nausea and diarrhea. Patient does not desire  detox.   Review of the 07/30/2015 shows patient has gotten 38 narcotic or controlled prescriptions in the last 6 months, these have been written by 11 different providers and they have been filled at 10 different pharmacies. She last received #450 tramadol 50 mg tablets on March 9, #12 tramadol 50 mg tablets on March 7, #75 oxycodone 10 mg tablets on 2/27, #450 tramadol 50 mg tablets on 2/14, #240 tramadol 50 mg tablets on February 2, 10 hydrocodone 5/325 on February 1, #240 tramadol 50 mg tablets on January 19, 5 lorazepam on January 18, #120 tramadol 50 mg tablets on January 8, #30 oxycodone December 29, #120 tramadol 50 mm tablets on December 29, #8 oxycodone 5/325 on December 29, #10 tramadol on December 27, #16 tramadol on December 25, #42 oxycodone 5 on December 21, #30 oxycodone  10 mg tablets on December 15, #13 tramadol 50 mg tablets on December 12, #10 Valium 5 mg tablets on December 9, #28 oxycodone 5 mg tablets on December 9, #45 tramadol 50 mg tablets on December 6, #4 hydrocodone 5/325 on December 1, #60 oxycodone 10 mg on December 1.    MDM   Final diagnoses:  Narcotic dependence [facility])    Plan discharge  Devoria Albe, MD, Concha Pyo, MD 12/17/15 [phone]

## 2015-12-17 NOTE — ED Notes (Signed)
Pt needs refill on pain management medication for Cancer. States she was told not to stop taking it abruptly.

## 2015-12-17 NOTE — ED Notes (Signed)
Dr Lynelle Doctor in to discuss with pt her multiple Rx from different doctors filled at different sites. MD evaluation, long discussion regarding one MD to prescribe meds in pts best interest for her chronic pain

## 2015-12-17 NOTE — Discharge Instructions (Signed)
You need to see your pain management doctor to get any further refills of your pain medication. We do not refill your medications when you run out in the emergency department.  Chronic Pain Discharge Instructions  Emergency care providers appreciate that many patients coming to [place] are in severe pain and we wish to address their pain in the safest, most responsible manner.  It is important to recognize however, that the proper treatment of chronic pain differs from that of the pain of injuries and acute illnesses.  Our goal is to provide quality, safe, personalized care and we thank you for giving [place] the opportunity to serve you. The use of narcotics and related agents for chronic pain syndromes may lead to additional physical and psychological problems.  Nearly as many people die from prescription narcotics each year as die from car crashes.  Additionally, this risk is increased if such prescriptions are obtained from a variety of sources.  Therefore, only your primary care physician or a pain management specialist is able to safely treat such syndromes with narcotic medications long-term.    Documentation revealing such prescriptions have been sought from multiple sources may prohibit [place] from providing a refill or different narcotic medication.  Your name may be checked first through the [facility] Controlled Substances Reporting System.  This database is a record of controlled substance medication prescriptions that the patient has received.  This has been established by [facility] in an effort to eliminate the dangerous, and often life threatening, practice of obtaining multiple prescriptions from different medical providers.   If you have a chronic pain syndrome (i.e. chronic headaches, recurrent back or neck pain, dental pain, abdominal or pelvis pain without a specific diagnosis, or neuropathic pain such as fibromyalgia) or recurrent visits for the same condition without an acute diagnosis, you  may be treated with non-narcotics and other non-addictive medicines.  Allergic reactions or negative side effects that may be reported by a patient to such medications will not typically lead to the use of a narcotic analgesic or other controlled substance as an alternative.   Patients managing chronic pain with a personal physician should have provisions in place for breakthrough pain.  If you are in crisis, you should call your physician.  If your physician directs you to the emergency department, please have the doctor call and speak to our attending physician concerning your care.   When patients come to the Emergency Department (ED) with acute medical conditions in which the Emergency Department physician feels appropriate to prescribe narcotic or sedating pain medication, the physician will prescribe these in very limited quantities.  The amount of these medications will last only until you can see your primary care physician in his/her office.  Any patient who returns to the ED seeking refills should expect only non-narcotic pain medications.   In the event of an acute medical condition exists and the emergency physician feels it is necessary that the patient be given a narcotic or sedating medication -  a responsible adult driver should be present in the room prior to the medication being given by the nurse.   Prescriptions for narcotic or sedating medications that have been lost, stolen or expired will not be refilled in the Emergency Department.    Patients who have chronic pain may receive non-narcotic prescriptions until seen by their primary care physician.  It is every patients personal responsibility to maintain active prescriptions with his or her primary care physician or specialist.

## 2015-12-17 NOTE — ED Notes (Signed)
Discharged per MD instruct. Pt inquires what she is supposed to do regarding her prescriptions since she has no money. She reports that she no longer works, that she has applied for [employer]. This RN suggest that her options may be to go to social services to see if they might assist, or perhaps her physician might refer her to a pain clinic

## 2016-05-25 ENCOUNTER — Encounter (HOSPITAL_BASED_OUTPATIENT_CLINIC_OR_DEPARTMENT_OTHER): Payer: Self-pay | Admitting: *Deleted

## 2016-05-25 DIAGNOSIS — Z79899 Other long term (current) drug therapy: Secondary | ICD-10-CM | POA: Insufficient documentation

## 2016-05-25 DIAGNOSIS — M549 Dorsalgia, unspecified: Secondary | ICD-10-CM | POA: Insufficient documentation

## 2016-05-25 DIAGNOSIS — Z791 Long term (current) use of non-steroidal anti-inflammatories (NSAID): Secondary | ICD-10-CM | POA: Insufficient documentation

## 2016-05-25 DIAGNOSIS — Z7289 Other problems related to lifestyle: Secondary | ICD-10-CM | POA: Insufficient documentation

## 2016-05-25 DIAGNOSIS — Z87891 Personal history of nicotine dependence: Secondary | ICD-10-CM | POA: Insufficient documentation

## 2016-05-25 DIAGNOSIS — G8929 Other chronic pain: Secondary | ICD-10-CM | POA: Insufficient documentation

## 2016-05-25 LAB — URINALYSIS, ROUTINE W REFLEX MICROSCOPIC
BILIRUBIN URINE: NEGATIVE
Glucose, UA: NEGATIVE mg/dL
Hgb urine dipstick: NEGATIVE
Ketones, ur: NEGATIVE mg/dL
NITRITE: POSITIVE — AB
PH: 5.5 (ref 5.0–8.0)
Protein, ur: NEGATIVE mg/dL
SPECIFIC GRAVITY, URINE: 1.025 (ref 1.005–1.030)

## 2016-05-25 LAB — URINE MICROSCOPIC-ADD ON

## 2016-05-25 LAB — PREGNANCY, URINE: Preg Test, Ur: NEGATIVE

## 2016-05-25 NOTE — ED Triage Notes (Signed)
Pelvic pain x 3 days. States she is getting knots on her joints. Hx of arthritis.

## 2016-05-26 ENCOUNTER — Encounter (HOSPITAL_BASED_OUTPATIENT_CLINIC_OR_DEPARTMENT_OTHER): Payer: Self-pay | Admitting: Emergency Medicine

## 2016-05-26 ENCOUNTER — Emergency Department (HOSPITAL_BASED_OUTPATIENT_CLINIC_OR_DEPARTMENT_OTHER)
Admission: EM | Admit: 2016-05-26 | Discharge: 2016-05-26 | Discharge status: Against Medical Advice | Payer: Self-pay | Attending: Emergency Medicine | Admitting: Emergency Medicine

## 2016-05-26 DIAGNOSIS — M549 Dorsalgia, unspecified: Secondary | ICD-10-CM

## 2016-05-26 DIAGNOSIS — G8929 Other chronic pain: Secondary | ICD-10-CM

## 2016-05-26 DIAGNOSIS — Z765 Malingerer [conscious simulation]: Secondary | ICD-10-CM

## 2016-05-26 HISTORY — DX: Interstitial cystitis (chronic) without hematuria: N30.10

## 2016-05-26 MED ORDER — METHYLPREDNISOLONE SODIUM SUCC 125 MG IJ SOLR
125.0000 mg | Freq: Once | INTRAMUSCULAR | Status: DC
  Filled 2016-05-26: qty 2

## 2016-05-26 NOTE — ED Notes (Signed)
MD [place] at bedside. 

## 2016-05-26 NOTE — ED Notes (Signed)
Pt reports "flare up" started 3 days ago.

## 2016-05-26 NOTE — ED Notes (Signed)
This RN at bedside with MD Molpus while MD was assessing patient. Pt adamantly refused that she had seen anyone for her pain when asked by MD Molpus. When MD confronted the patient that she had in fact, actually been seen for this, pt sts "I thought you meant my PCP."

## 2016-05-26 NOTE — ED Notes (Signed)
Solumedrol wasted in sink due to pt elopement.

## 2016-05-26 NOTE — ED Notes (Signed)
Pt left without her medication and without discharge papers.

## 2016-05-26 NOTE — ED Provider Notes (Addendum)
[place] [facility]PT [facility] Provider Note   CSN: [id number] Arrival date & time: 05/25/16  2244     History   Chief Complaint Chief Complaint  Patient presents with  . Pelvic Pain    HPI Dana Elliott is a 35 y.o. female with a history of interstitial cystitis and rheumatoid arthritis as well as opiate dependence. She is here with a three-day history of pelvic pain which she attributes to an exacerbation of her interstitial cystitis. She also complains of pain in her left pelvic region which is both sharp and dull. Her pain is worse with movement or palpation but not with urination. She describes the pain as severe.   When ask if she had seen anyone recently for this she replied no. She was then advised that she was in fact seen at [facility] on the 27th of this month and had a workup including an abdominal and pelvic CT which was unremarkable as well as laboratory studies which were largely unremarkable. She then replied "oh I thought you met my regular doctor".  I then asked her if she had contacted her OB/GYN recently. She replied no. A review of her records in Epic show that she made 2 phone calls to an Stephens November, M.D. yesterday requesting pain medication. On both occasions Dr. Redmond Pulling told her she would not be getting narcotic medications as she had artery had a tramadol prescription written this weekend. Dr. Dois Davenport notes indicate the patient expressed an intent to go to the ED in an attempt to get pain medication. Dr. Dois Davenport note also mentions a Dr. Forestine Na, an Ob-Gyn who was reportedly the patient's former pain management physician, who has recommended against prescribing opioid pain medication.  In addition to her pelvic pain she also complains of generalized joint pain which she attributes to a rheumatoid arthritis flare. She has not seen her rheumatologist in almost a year. She is complaining of pain notably in her hands and left elbow. She points to several knots (on  her right hand, several fingers and left elbow) which she insists have developed over just the past 24 hours.   She denies fever, nausea, vomiting, diarrhea, vaginal bleeding, vaginal discharge but does report chills.  A review of the [place] controlled substances database reveals the patient has received 48 prescriptions for opioids in the past year from 14 providers in 6 cities. These have included tramadol, hydrocodone and oxycodone. The total for the past year is 3500 tablets.   HPI  Past Medical History:  Diagnosis Date  . Abnormal Pap smear   . Allergy   . Anxiety   . Chronic neck pain   . Depression   . Headache   . Interstitial cystitis   . Ovarian cyst   . PID (acute pelvic inflammatory disease)   . Rheumatoid arthritis(714.0)   . Thyroid disease     Patient Active Problem List   Diagnosis Date Noted  . Opiate dependence ([place]) 03/16/2014  . Major depression ([place]) 03/16/2014  . Ache in joint 02/08/2013  . MDD (major depressive disorder) ([place]) 11/16/2011  . Anxiety 11/16/2011  . Rheumatoid arthritis(714.0) 11/16/2011  . LSIL (low grade squamous intraepithelial lesion) on Pap smear 11/16/2011    Past Surgical History:  Procedure Laterality Date  . ABDOMINAL HYSTERECTOMY    . LAPAROSCOPIC TUBAL LIGATION    . TUBAL LIGATION      OB History    Gravida Para Term Preterm AB Living   _0 2  SAB TAB Ectopic Multiple Live Births     2             Home Medications    Prior to Admission medications   Medication Sig Start Date End Date Taking? Authorizing Provider  ibuprofen (ADVIL,MOTRIN) 200 MG tablet Take 600-800 mg by mouth every 6 (six) hours as needed for moderate pain.   Yes Historical Provider, MD  levothyroxine (SYNTHROID, LEVOTHROID) 175 MCG tablet Take 1 tablet (175 mcg total) by mouth daily. For low thyroid function 12/25/14  Yes Noland Fordyce, PA-C  pentosan polysulfate (ELMIRON) 100 MG capsule Take 100 mg by mouth 3 (three) times daily.    Yes Historical Provider, MD  traMADol (ULTRAM) 50 MG tablet Take 1 tablet (50 mg total) by mouth every 6 (six) hours as needed. Patient taking differently: Take 100-150 mg by mouth every 4 (four) hours as needed for moderate pain or severe pain.  12/25/14  Yes Noland Fordyce, PA-C  azaTHIOprine (IMURAN) 50 MG tablet Take 50 mg by mouth daily. 09/12/15   Historical Provider, MD  [place] (states is an old prescription)    Historical Provider, MD    Family History Family History  Problem Relation Age of Onset  . Diabetes Mother   . Hypertension Mother   . Alcohol abuse Paternal Grandmother   . Hypothyroidism Paternal Grandfather   . Other Neg Hx     Social History Social History  Substance Use Topics  . Smoking status: Former Smoker    Quit date: 03/16/2013  . Smokeless tobacco: Never Used  . Alcohol use No     Allergies   Penicillins; Tramadol; Reglan [metoclopramide]; and Toradol [ketorolac tromethamine]   Review of Systems Review of Systems  All other systems reviewed and are negative.   Physical Exam Updated Vital Signs BP 118/89 (BP Location: Right Arm)   Pulse 106   Temp 97.9 F (36.6 C) (Oral)   Resp 18   Ht 5' 6" (1.676 m)   Wt 190 lb (86.2 kg)   LMP 07/28/2015   SpO2 100%   BMI 30.67 kg/m   Physical Exam General: Well-developed, well-nourished female in no acute distress or obvious discomfort; appearance consistent with age of record HENT: normocephalic; atraumatic Eyes: pupils equal, round and reactive to light; extraocular muscles intact Neck: supple Heart: regular rate and rhythm Lungs: clear to auscultation bilaterally Abdomen: soft; nondistended; suprapubic tenderness more intense on the left; no masses or hepatosplenomegaly; bowel sounds present Extremities: No deformity; full range of motion; pulses normal; tender nodule dorsal right hand consistent with a ganglion cyst; several tender nodules of the fingers and the left  olecranon without appreciable swelling, erythema or warmth Neurologic: Awake, alert and oriented; motor function intact in all extremities and symmetric; no facial droop Skin: Warm and dry Psychiatric: Normal mood and affect    ED Treatments / Results   Nursing notes and vitals signs, including pulse oximetry, reviewed.  Summary of this visit's results, reviewed by myself:  Labs:  Results for orders placed or performed during the hospital encounter of 05/26/16 (from the past 24 hour(s))  Urinalysis, Routine w reflex microscopic (not at [facility])     Status: Abnormal   Collection Time: 05/25/16 10:59 PM  Result Value Ref Range   Color, Urine YELLOW YELLOW   APPearance CLOUDY (A) CLEAR   Specific Gravity, Urine 1.025 1.005 - 1.030   pH 5.5 5.0 - 8.0   Glucose, UA NEGATIVE NEGATIVE mg/dL   Hgb urine dipstick  NEGATIVE NEGATIVE   Bilirubin Urine NEGATIVE NEGATIVE   Ketones, ur NEGATIVE NEGATIVE mg/dL   Protein, ur NEGATIVE NEGATIVE mg/dL   Nitrite POSITIVE (A) NEGATIVE   Leukocytes, UA SMALL (A) NEGATIVE  Pregnancy, urine     Status: None   Collection Time: 05/25/16 10:59 PM  Result Value Ref Range   Preg Test, Ur NEGATIVE NEGATIVE  Urine microscopic-add on     Status: Abnormal   Collection Time: 05/25/16 10:59 PM  Result Value Ref Range   Squamous Epithelial / LPF 6-30 (A) NONE SEEN   WBC, UA 0-5 0 - 5 WBC/hpf   RBC / HPF 0-5 0 - 5 RBC/hpf   Bacteria, UA MANY (A) NONE SEEN   Procedures (including critical care time)   Final Clinical Impressions(s) / ED Diagnoses  2:45 AM Concerns over the patient's narcotic abuse history per explained to the patient. She was advised that her history was not consistent with recent events which raises red flags. She is amenable to an injection of Solu-Medrol for acute exacerbation of interstitial cystitis and rheumatoid arthritis. She was advised that no narcotics will be prescribed to her in the emergency department is not the appropriate venue  for management of chronic conditions such as interstitial cystitis and rheumatoid arthritis; she was advised to contact her rheumatologist later today concerning her acute flare.  2:55 AM Patient eloped without receiving injection.  Final diagnoses:  Exacerbation of chronic back pain  Drug-seeking behavior      Shanon Rosser, MD 05/26/16 [place], MD 05/26/16 [place], MD 05/26/16 [id number]

## 2016-05-26 NOTE — ED Notes (Signed)
Pt choosing to walk out, stating her "ride is here", encouraged to wait, did not want to wait for meds, tx or d/c paperwork, unable to obtain VS. Steady gait, alert, NAD, calm, interactive, no dyspnea noted.

## 2016-05-26 NOTE — ED Notes (Signed)
Pt. Reports the lumps started to show up this morning and her neck started to hurt in the past 2 hours.

## 2017-01-10 IMAGING — US US PELVIS COMPLETE
1 series · 15 of 25 positions shown · non-contrast
Comparison: 06/05/2015 at [REDACTED]

CLINICAL DATA: Low back pain.

EXAM:
TRANSABDOMINAL AND TRANSVAGINAL ULTRASOUND OF PELVIS
TECHNIQUE: Both transabdominal and transvaginal ultrasound examinations of the
pelvis were performed. Transabdominal technique was performed for
global imaging of the pelvis including uterus, ovaries, adnexal
regions, and pelvic cul-de-sac. It was necessary to proceed with
endovaginal exam following the transabdominal exam to visualize the
ovaries and endometrium..

[Series 1: us pelvis complete · 15 of 52 slices shown]
[im 1/52]
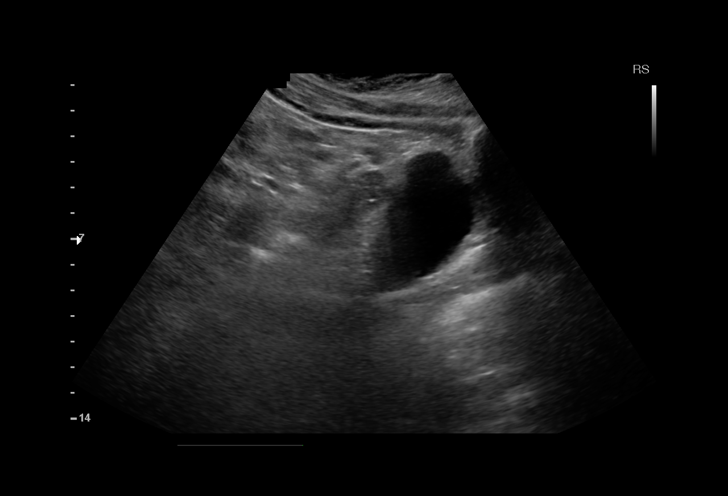
[im 5/52]
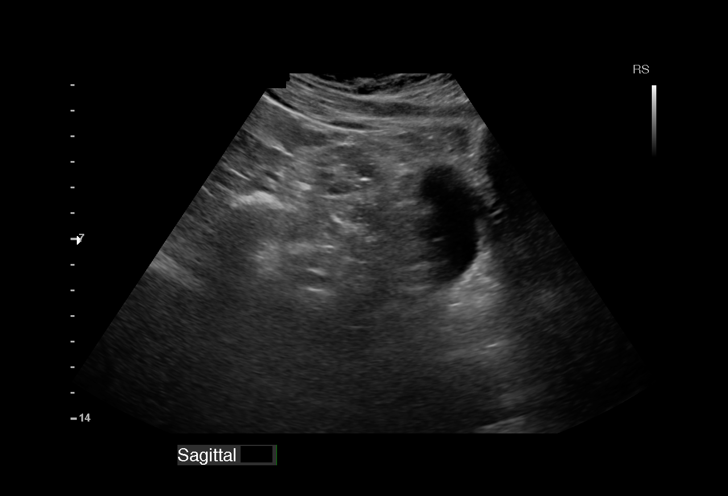
[im 9/52]
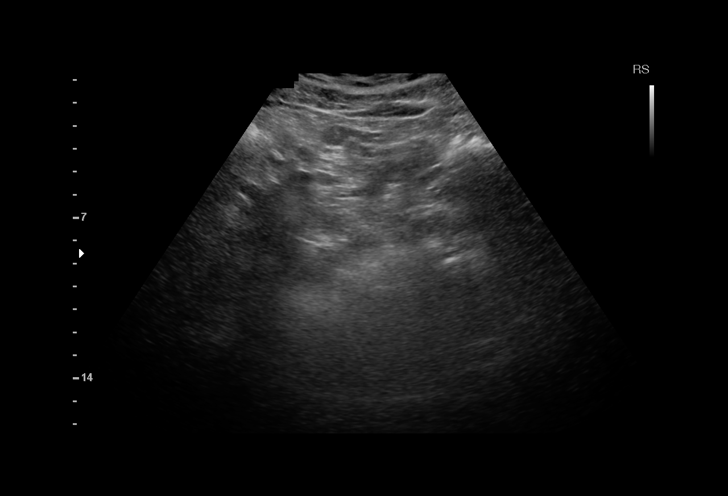
[im 11/52]
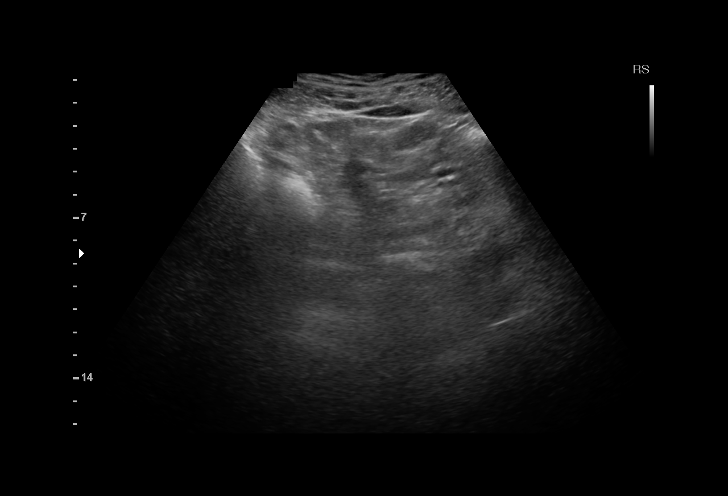
[im 15/52]
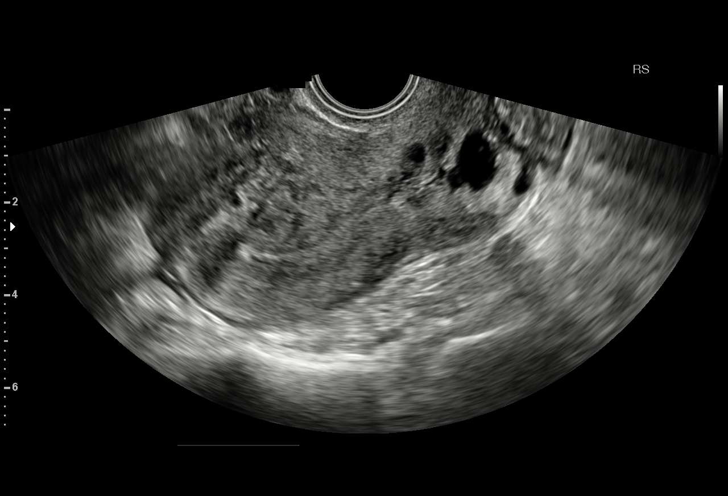
[im 20/52]
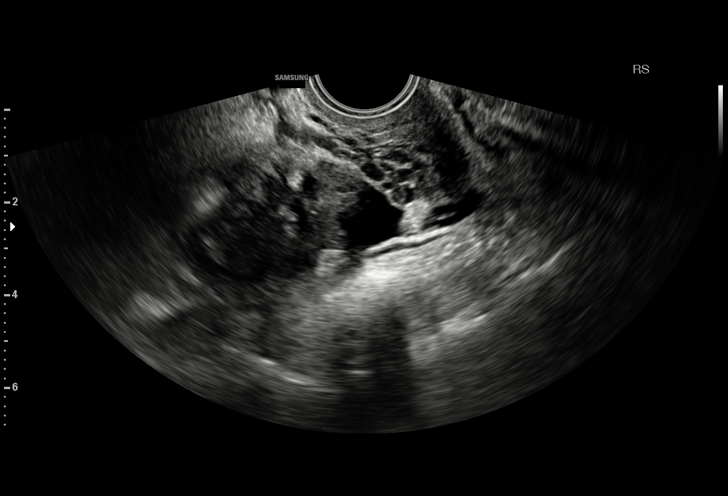
[im 22/52]
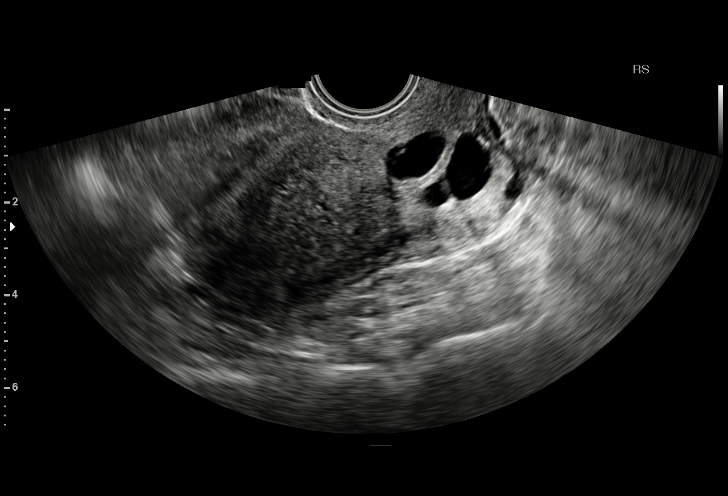
[im 26/52]
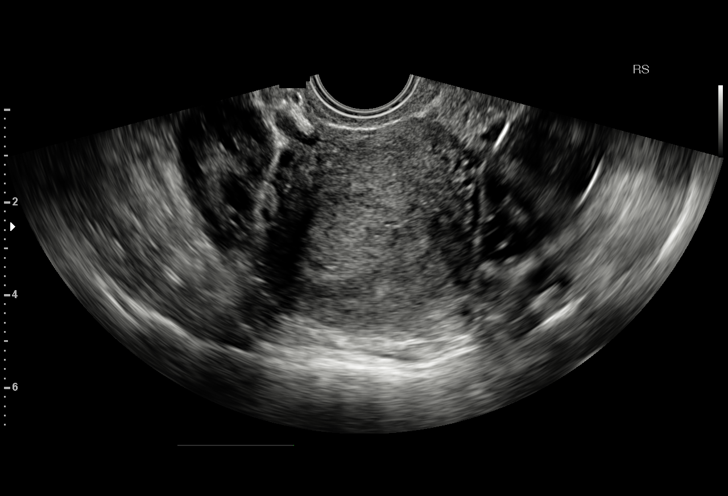
[im 30/52]
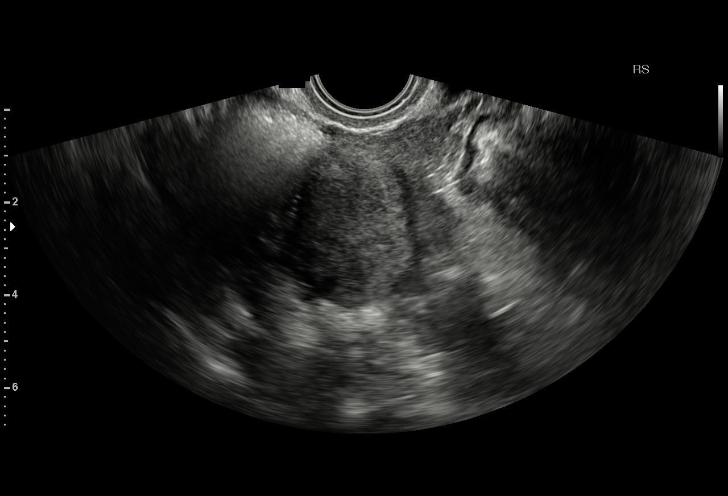
[im 32/52]
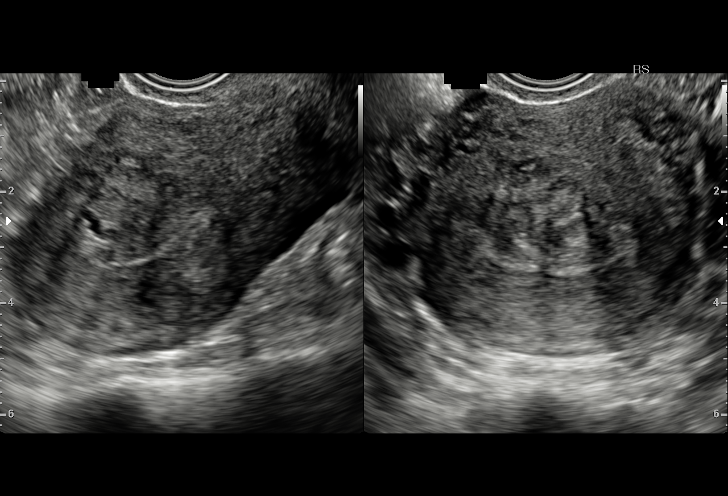
[im 37/52]
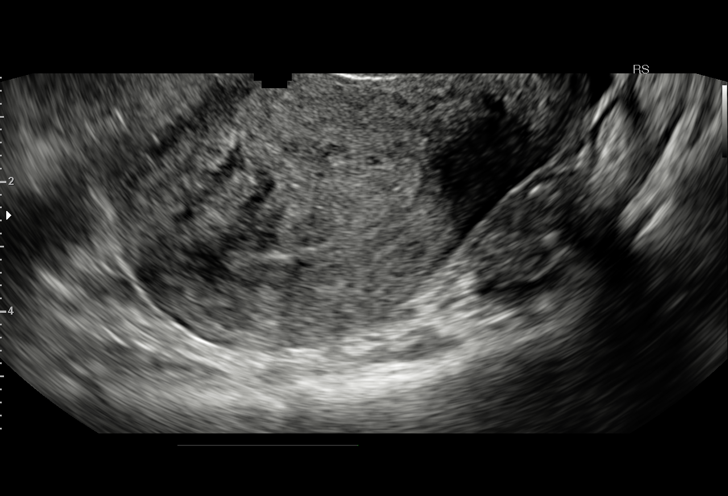
[im 41/52]
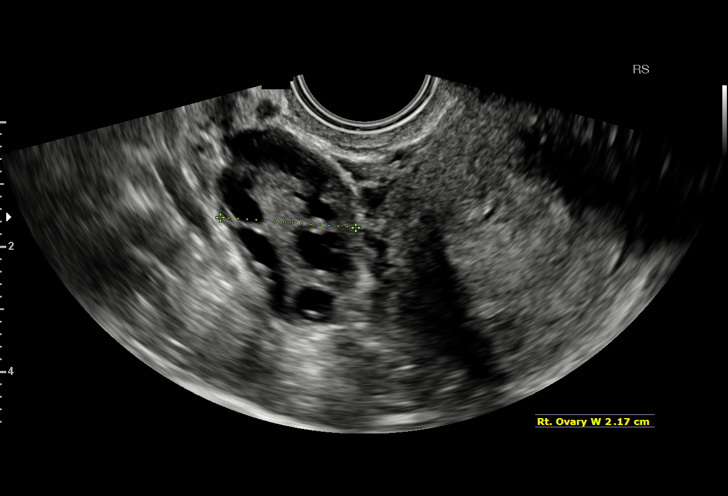
[im 43/52]
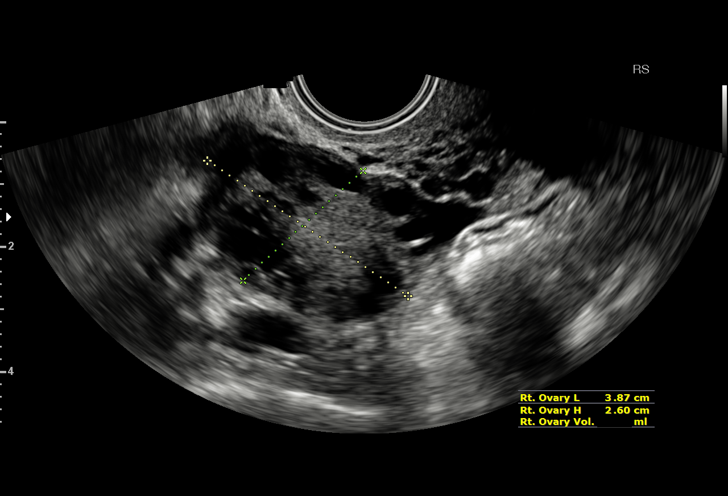
[im 47/52]
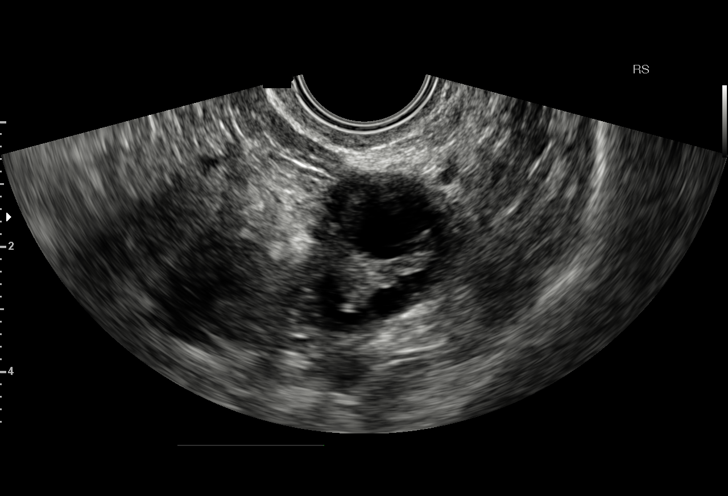
[im 52/52]
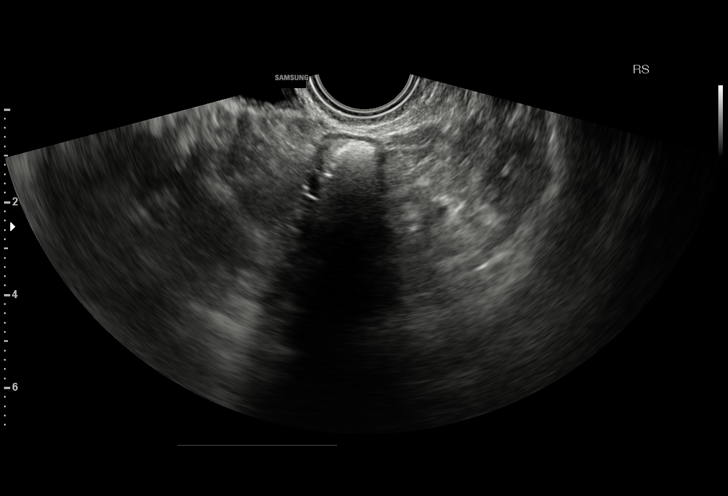

[15 of 25 positions shown; findings below may reference images not displayed]

FINDINGS: Uterus

Measurements: 9 x 4 x 5 cm. A measured area of heterogeneous
echogenicity at the level of the uterine body and fundus does not
have discrete masslike edges and was not seen 2 weeks ago at [REDACTED]. This heterogeneity is likely from adenomyosis, with
sub endometrial cysts and indistinct endometrium better seen on the
previous study.

Endometrium

Thickness: 6 mm. As above, no endometrial lesion is suspected, with
reassuring comparison study.

Right ovary

Measurements: 36 x 26 x 24 mm. Normal appearance/no adnexal mass.

Left ovary

Measurements: 39 x 26 x 22 mm. Normal appearance/no adnexal mass.

Other findings

No significant or complex free fluid.
IMPRESSION: 1. No acute finding.
2. Adenomyosis changes.

## 2017-02-10 ENCOUNTER — Emergency Department (HOSPITAL_BASED_OUTPATIENT_CLINIC_OR_DEPARTMENT_OTHER)
Admission: EM | Admit: 2017-02-10 | Discharge: 2017-02-10 | Disposition: A | Discharge status: Home / Self Care | Payer: Self-pay | Attending: Emergency Medicine | Admitting: Emergency Medicine

## 2017-02-10 ENCOUNTER — Encounter (HOSPITAL_BASED_OUTPATIENT_CLINIC_OR_DEPARTMENT_OTHER): Payer: Self-pay

## 2017-02-10 DIAGNOSIS — M069 Rheumatoid arthritis, unspecified: Secondary | ICD-10-CM | POA: Insufficient documentation

## 2017-02-10 DIAGNOSIS — Z87891 Personal history of nicotine dependence: Secondary | ICD-10-CM | POA: Insufficient documentation

## 2017-02-10 HISTORY — DX: Malingerer (conscious simulation): Z76.5

## 2017-02-10 HISTORY — DX: Opioid abuse, uncomplicated: F11.10

## 2017-02-10 MED ORDER — HYDROCODONE-ACETAMINOPHEN 5-325 MG PO TABS: 2.0000 | ORAL_TABLET | Freq: Three times a day (TID) | ORAL | 0 refills | Status: DC | PRN

## 2017-02-10 MED ORDER — PREDNISONE 10 MG PO TABS
10.0000 mg | ORAL_TABLET | Freq: Once | ORAL | Status: AC
  Administered 2017-02-10: 10 mg via ORAL
  Filled 2017-02-10: qty 1

## 2017-02-10 NOTE — ED Provider Notes (Signed)
[facility] Provider Note: Dana Dell, MD, FACEP  CSN: [id number] MRN: [id number] ARRIVAL: 02/10/17 at 0256 ROOM: MH11/[facility]11   CHIEF COMPLAINT  Hand Pain   HISTORY OF PRESENT ILLNESS  Dana Elliott is a 36 y.o. female with a history of interstitial cystitis and rheumatoid arthritis. She also has a documented history of narcotic abuse and drug-seeking behavior. She acknowledges this behavior in the past, has largely gotten off of narcotics, and states she has not had a prescription for hydrocodone since February of this year. This was verified with the state controlled substances database.  The patient is here with an exacerbation of her rheumatoid arthritis for the past several days. She is having pain and swelling in her hands including her wrist joints. She rates her pain as a 7 out of 10 which is worse with any movement. She has had an injection of Humira which has not relieved her pain. She has prednisone at home which she has taken without relief. She is requesting something for pain and to hold her until she sees her rheumatologist the day after tomorrow.   Past Medical History:  Diagnosis Date  . Abnormal Pap smear   . Allergy   . Anxiety   . Chronic neck pain   . Depression   . Drug-seeking behavior   . Headache   . Interstitial cystitis   . Narcotic abuse   . Ovarian cyst   . PID (acute pelvic inflammatory disease)   . Rheumatoid arthritis(714.0)   . Thyroid disease     Past Surgical History:  Procedure Laterality Date  . ABDOMINAL HYSTERECTOMY    . LAPAROSCOPIC TUBAL LIGATION    . TUBAL LIGATION      Family History  Problem Relation Age of Onset  . Diabetes Mother   . Hypertension Mother   . Alcohol abuse Paternal Grandmother   . Hypothyroidism Paternal Grandfather   . Other Neg Hx     Social History  Substance Use Topics  . Smoking status: Former Smoker    Quit date: 03/16/2013  . Smokeless tobacco: Never Used  . Alcohol use No     Prior to Admission medications   Medication Sig Start Date End Date Taking? Authorizing Provider  azaTHIOprine (IMURAN) 50 MG tablet Take 50 mg by mouth daily. 09/12/15   [provider]  ibuprofen (ADVIL,MOTRIN) 200 MG tablet Take 600-800 mg by mouth every 6 (six) hours as needed for moderate pain.    [provider]  levothyroxine (SYNTHROID, LEVOTHROID) 175 MCG tablet Take 1 tablet (175 mcg total) by mouth daily. For low thyroid function 12/25/14   Junius Finner, PA-C  pentosan polysulfate (ELMIRON) 100 MG capsule Take 100 mg by mouth 3 (three) times daily.    [provider]  PRESCRIPTION MEDICATION Percocet (states is an old prescription)    [provider]  traMADol (ULTRAM) 50 MG tablet Take 1 tablet (50 mg total) by mouth every 6 (six) hours as needed. Patient taking differently: Take 100-150 mg by mouth every 4 (four) hours as needed for moderate pain or severe pain.  12/25/14   Junius Finner, PA-C    Allergies Penicillins; Tramadol; Reglan [metoclopramide]; and Toradol [ketorolac tromethamine]   REVIEW OF SYSTEMS  Negative except as noted here or in the History of Present Illness.   PHYSICAL EXAMINATION  Initial Vital Signs Blood pressure (!) 123/97, pulse (!) 106, temperature 98.2 F (36.8 C), temperature source Oral, resp. rate 18, height 5\' 6"  (1.676 m), weight  185 lb (83.9 kg), last menstrual period 07/28/2015, SpO2 100 %.  Examination General: Well-developed, well-nourished female in no acute distress; appearance consistent with age of record HENT: normocephalic; atraumatic Eyes: pupils equal, round and reactive to light; extraocular muscles intact Neck: supple Heart: regular rate and rhythm Lungs: clear to auscultation bilaterally Abdomen: soft; nondistended; nontender; bowel sounds present Extremities: Full range of motion; pulses normal; swelling, tenderness and joint changes of hands consistent with rheumatoid arthritis  without gross ulnar deviation Neurologic: Awake, alert and oriented; motor function intact in all extremities and symmetric; no facial droop Skin: Warm and dry Psychiatric: Normal mood and affect   RESULTS  Summary of this visit's results, reviewed by myself:   EKG Interpretation  Date/Time:    Ventricular Rate:    PR Interval:    QRS Duration:   QT Interval:    QTC Calculation:   R Axis:     Text Interpretation:        Laboratory Studies: No results found for this or any previous visit (from the past 24 hour(s)). Imaging Studies: No results found.  ED COURSE  Nursing notes and initial vitals signs, including pulse oximetry, reviewed.  Vitals:   02/10/17 0306  BP: (!) 123/97  Pulse: (!) 106  Resp: 18  Temp: 98.2 F (36.8 C)  TempSrc: Oral  SpO2: 100%  Weight: 185 lb (83.9 kg)  Height: 5\' 6"  (1.676 m)   As noted above the patient a technologist her past abuse of narcotics and is requesting only a small prescription pending her rheumatology follow-up this Friday. We will provide 6 hydrocodone tablets.  PROCEDURES    ED DIAGNOSES     ICD-9-CM ICD-10-CM   1. Rheumatoid arthritis flare (HCC) 714.0 M06.9        Margy Sumler, Friday, MD 02/10/17 [phone]

## 2017-02-10 NOTE — ED Notes (Signed)
Pt verbalizes understanding of d/c instructions and denies any further needs at this time. 

## 2017-02-10 NOTE — ED Triage Notes (Signed)
Pt c/o flare up from rheumatoid arthritis, swelling and pain in bilateral hands, cannot get into her doctor until friday

## 2017-04-12 ENCOUNTER — Encounter (HOSPITAL_BASED_OUTPATIENT_CLINIC_OR_DEPARTMENT_OTHER): Payer: Self-pay | Admitting: *Deleted

## 2017-04-12 ENCOUNTER — Emergency Department (HOSPITAL_BASED_OUTPATIENT_CLINIC_OR_DEPARTMENT_OTHER)
Admission: EM | Admit: 2017-04-12 | Discharge: 2017-04-12 | Disposition: A | Discharge status: Home / Self Care | Payer: Self-pay | Attending: Emergency Medicine | Admitting: Emergency Medicine

## 2017-04-12 DIAGNOSIS — S0501XA Injury of conjunctiva and corneal abrasion without foreign body, right eye, initial encounter: Secondary | ICD-10-CM | POA: Insufficient documentation

## 2017-04-12 DIAGNOSIS — Y929 Unspecified place or not applicable: Secondary | ICD-10-CM | POA: Insufficient documentation

## 2017-04-12 DIAGNOSIS — Z87891 Personal history of nicotine dependence: Secondary | ICD-10-CM | POA: Insufficient documentation

## 2017-04-12 DIAGNOSIS — T7840XA Allergy, unspecified, initial encounter: Secondary | ICD-10-CM | POA: Insufficient documentation

## 2017-04-12 DIAGNOSIS — Y999 Unspecified external cause status: Secondary | ICD-10-CM | POA: Insufficient documentation

## 2017-04-12 DIAGNOSIS — Z79899 Other long term (current) drug therapy: Secondary | ICD-10-CM | POA: Insufficient documentation

## 2017-04-12 DIAGNOSIS — X58XXXA Exposure to other specified factors, initial encounter: Secondary | ICD-10-CM | POA: Insufficient documentation

## 2017-04-12 DIAGNOSIS — Y939 Activity, unspecified: Secondary | ICD-10-CM | POA: Insufficient documentation

## 2017-04-12 MED ORDER — FLUORESCEIN SODIUM 0.6 MG OP STRP
ORAL_STRIP | OPHTHALMIC | Status: AC
  Filled 2017-04-12: qty 1

## 2017-04-12 MED ORDER — TETRACAINE HCL 0.5 % OP SOLN
1.0000 [drp] | Freq: Once | OPHTHALMIC | Status: AC
  Administered 2017-04-12: 1 [drp] via OPHTHALMIC
  Filled 2017-04-12: qty 4

## 2017-04-12 MED ORDER — FLUORESCEIN SODIUM 0.6 MG OP STRP
1.0000 | ORAL_STRIP | Freq: Once | OPHTHALMIC | Status: AC
  Administered 2017-04-12: 1 via OPHTHALMIC
  Filled 2017-04-12: qty 1

## 2017-04-12 MED ORDER — HYDROCODONE-ACETAMINOPHEN 5-325 MG PO TABS: 1.0000 | ORAL_TABLET | Freq: Four times a day (QID) | ORAL | 0 refills | Status: DC | PRN

## 2017-04-12 MED ORDER — DIPHENHYDRAMINE HCL 25 MG PO CAPS
25.0000 mg | ORAL_CAPSULE | Freq: Once | ORAL | Status: AC
Start: 2017-04-12 — End: 2017-04-12
  Administered 2017-04-12: 25 mg via ORAL
  Filled 2017-04-12: qty 1

## 2017-04-12 MED ORDER — ERYTHROMYCIN 5 MG/GM OP OINT: TOPICAL_OINTMENT | OPHTHALMIC | 0 refills | Status: DC

## 2017-04-12 MED ORDER — HYDROCODONE-ACETAMINOPHEN 5-325 MG PO TABS
1.0000 | ORAL_TABLET | Freq: Once | ORAL | Status: AC
  Administered 2017-04-12: 1 via ORAL
  Filled 2017-04-12: qty 1

## 2017-04-12 NOTE — Discharge Instructions (Signed)
Please take Benadryl for swelling/itching for the next several days Take pain medicine as needed Use antibiotic ointment 4 times a day for 5 days Follow up with ophthalmology

## 2017-04-12 NOTE — ED Triage Notes (Signed)
Pain and blurred vision in her right eye after applying glitter makeup this am. She feels she scratched her cornea. She removed the makeup immediatly.

## 2017-04-12 NOTE — ED Notes (Signed)
Patient stated that her right eye feels like there is something inside and felt like pressure building up.

## 2017-04-12 NOTE — ED Provider Notes (Signed)
[facility] Note   CSN: [id number] Arrival date & time: 04/12/17  1434   By signing my name below, I, Cynda Acres, attest that this documentation has been prepared under the direction and in the presence of Terance Hart, PA-C.  Electronically Signed: Cynda Acres, Scribe. 04/12/17. 5:05 PM.  History   Chief Complaint Chief Complaint  Patient presents with  . Blurred Vision   HPI Comments: Dana Elliott is a 36 y.o. female with a history of allergies, who presents to the Emergency Department complaining of sudden-onset, constant bilateral eye pain that began earlier today. Patient reports applying a new glitter eye shadow to the eye, when she began feeling a burning and itching around the eye and a FB sensation and pressure in the eye. Patient states both her right and left eyes are irritated, but the right is the worse than the left. Patient reports removing the makeup immediately after pressure-like feeling. She copiously irrigated the eyes with saline drops with no relief. Patient reports associated eye redness, right-sided facial pain, right-sided facial redness, blurred vision, and itching. She also has photophobia and "white" eye discharge. Patient denies wearing any contacts or glasses. Patient denies any fever, chills, nausea, vomiting, or any additional symptoms.   The history is provided by the patient. No language interpreter was used.    Past Medical History:  Diagnosis Date  . Abnormal Pap smear   . Allergy   . Anxiety   . Chronic neck pain   . Depression   . Drug-seeking behavior   . Headache   . Interstitial cystitis   . Narcotic abuse   . Ovarian cyst   . PID (acute pelvic inflammatory disease)   . Rheumatoid arthritis(714.0)   . Thyroid disease     Patient Active Problem List   Diagnosis Date Noted  . Opiate dependence (HCC) 03/16/2014  . Major depression 03/16/2014  . Ache in joint 02/08/2013  . MDD (major depressive disorder)  11/16/2011  . Anxiety 11/16/2011  . Rheumatoid arthritis(714.0) 11/16/2011  . LSIL (low grade squamous intraepithelial lesion) on Pap smear 11/16/2011    Past Surgical History:  Procedure Laterality Date  . ABDOMINAL HYSTERECTOMY    . LAPAROSCOPIC TUBAL LIGATION    . TUBAL LIGATION      OB History    Gravida Para Term Preterm AB Living   4 2 1 1 2 2    SAB TAB Ectopic Multiple Live Births     2             Home Medications    Prior to Admission medications   Medication Sig Start Date End Date Taking? Authorizing Provider  levothyroxine (SYNTHROID, LEVOTHROID) 175 MCG tablet Take 1 tablet (175 mcg total) by mouth daily. For low thyroid function 12/25/14  Yes Phelps, 12/27/14, PA-C  azaTHIOprine (IMURAN) 50 MG tablet Take 50 mg by mouth daily. 09/12/15   [provider]  HYDROcodone-acetaminophen (NORCO) 5-325 MG tablet Take 2 tablets by mouth 3 (three) times daily as needed (for pain). 02/10/17   Molpus, John, MD  ibuprofen (ADVIL,MOTRIN) 200 MG tablet Take 600-800 mg by mouth every 6 (six) hours as needed for moderate pain.    [provider]  pentosan polysulfate (ELMIRON) 100 MG capsule Take 100 mg by mouth 3 (three) times daily.    [provider]  PRESCRIPTION MEDICATION Percocet (states is an old prescription)    [provider]    Family History Family History  Problem Relation Age  of Onset  . Diabetes Mother   . Hypertension Mother   . Alcohol abuse Paternal Grandmother   . Hypothyroidism Paternal Grandfather   . Other Neg Hx     Social History Social History  Substance Use Topics  . Smoking status: Former Smoker    Quit date: 03/16/2013  . Smokeless tobacco: Never Used  . Alcohol use No     Allergies   Penicillins; Tramadol; Reglan [metoclopramide]; and Toradol [ketorolac tromethamine]   Review of Systems Review of Systems  Constitutional: Negative for chills and fever.  HENT:       Right sided facial pain and  redness.   Eyes: Positive for photophobia, pain (bilateral]), discharge, redness (right), itching (right) and visual disturbance (blurred vision).  Gastrointestinal: Negative for diarrhea, nausea and vomiting.  Skin:       Redness around eye     Physical Exam Updated Vital Signs BP (!) 130/102   Pulse (!) 122   Temp 98.2 F (36.8 C) (Oral)   Resp 20   Ht 5\' 6"  (1.676 m)   Wt 185 lb (83.9 kg)   LMP 07/28/2015   SpO2 100%   BMI 29.86 kg/m   Physical Exam  Constitutional: She is oriented to person, place, and time. She appears well-developed and well-nourished. She appears distressed (mildly due to pain).  HENT:  Head: Normocephalic and atraumatic.  Eyes: Pupils are equal, round, and reactive to light. EOM and lids are normal. Lids are everted and swept, no foreign bodies found. Right eye exhibits discharge (whitish mucoid discharge). Right eye exhibits no chemosis and no exudate. No foreign body present in the right eye. Left eye exhibits no discharge. No foreign body present in the left eye. Right conjunctiva is injected. Left conjunctiva is not injected. No scleral icterus. Right eye exhibits normal extraocular motion. Left eye exhibits normal extraocular motion.  Slit lamp exam:      The right eye shows corneal abrasion and fluorescein uptake (in right upper outer region).  Mild swelling of right upper and lower eyelids with tenderness  Unable to tolerate tonopen exam  Neck: Normal range of motion. Neck supple.  Cardiovascular: Normal rate and regular rhythm.   Pulmonary/Chest: Effort normal and breath sounds normal. No respiratory distress.  Abdominal: She exhibits no distension.  Musculoskeletal: Normal range of motion.  Neurological: She is alert and oriented to person, place, and time.  Skin: Skin is warm and dry.  Psychiatric: Her behavior is normal. Her mood appears anxious.  Nursing note and vitals reviewed.    ED Treatments / Results  DIAGNOSTIC STUDIES: Oxygen  Saturation is 100% on RA, normal by my interpretation.    COORDINATION OF CARE: 5:06 PM Discussed treatment plan with pt at bedside and pt agreed to plan, which includes   Labs (all labs ordered are listed, but only abnormal results are displayed) Labs Reviewed - No data to display  EKG  EKG Interpretation None       Radiology No results found.  Procedures Procedures (including critical care time)  Medications Ordered in ED Medications  fluorescein ophthalmic strip 1 strip (1 strip Both Eyes Given by Other 04/12/17 1802)  tetracaine (PONTOCAINE) 0.5 % ophthalmic solution 1 drop (1 drop Both Eyes Given by Other 04/12/17 1803)  HYDROcodone-acetaminophen (NORCO/VICODIN) 5-325 MG per tablet 1 tablet (1 tablet Oral Given 04/12/17 1816)  diphenhydrAMINE (BENADRYL) capsule 25 mg (25 mg Oral Given 04/12/17 1816)     Initial Impression / Assessment and Plan / ED Course  I have reviewed the triage vital signs and the nursing notes.  Pertinent labs & imaging results that were available during my care of the patient were reviewed by me and considered in my medical decision making (see chart for details).  36 year old female presents with corneal abrasion of R eye and possible allergic reaction due to using a new eye make up product. She was unable to tolerate a full exam due to pain. Will rx erythromycin ointment and will have her take Benadryl for eye redness and itching. Advised f/u with Ophthalmology in the next several days if symptoms persist. Return precautions given.  Final Clinical Impressions(s) / ED Diagnoses   Final diagnoses:  Injury of conjunctiva and corneal abrasion of right eye without foreign body, initial encounter  Allergic reaction, initial encounter    New Prescriptions Discharge Medication List as of 04/12/2017  6:31 PM    START taking these medications   Details  erythromycin ophthalmic ointment Place a 1/2 inch ribbon of ointment into the lower eyelid 4 times  daily for 5 days, Print       I personally performed the services described in this documentation, which was scribed in my presence. The recorded information has been reviewed and is accurate.     Bethel Born, PA-C 04/12/17 2706    Cathren Laine, MD 04/14/17 1254

## 2017-05-05 ENCOUNTER — Encounter (HOSPITAL_BASED_OUTPATIENT_CLINIC_OR_DEPARTMENT_OTHER): Payer: Self-pay | Admitting: Emergency Medicine

## 2017-05-05 ENCOUNTER — Emergency Department (HOSPITAL_BASED_OUTPATIENT_CLINIC_OR_DEPARTMENT_OTHER)
Admission: EM | Admit: 2017-05-05 | Discharge: 2017-05-05 | Disposition: A | Discharge status: Home / Self Care | Payer: Self-pay | Attending: Emergency Medicine | Admitting: Emergency Medicine

## 2017-05-05 DIAGNOSIS — Z87891 Personal history of nicotine dependence: Secondary | ICD-10-CM | POA: Insufficient documentation

## 2017-05-05 DIAGNOSIS — S50861A Insect bite (nonvenomous) of right forearm, initial encounter: Secondary | ICD-10-CM | POA: Insufficient documentation

## 2017-05-05 DIAGNOSIS — W57XXXA Bitten or stung by nonvenomous insect and other nonvenomous arthropods, initial encounter: Secondary | ICD-10-CM | POA: Insufficient documentation

## 2017-05-05 DIAGNOSIS — Y999 Unspecified external cause status: Secondary | ICD-10-CM | POA: Insufficient documentation

## 2017-05-05 DIAGNOSIS — Y929 Unspecified place or not applicable: Secondary | ICD-10-CM | POA: Insufficient documentation

## 2017-05-05 DIAGNOSIS — Y939 Activity, unspecified: Secondary | ICD-10-CM | POA: Insufficient documentation

## 2017-05-05 DIAGNOSIS — Z765 Malingerer [conscious simulation]: Secondary | ICD-10-CM

## 2017-05-05 DIAGNOSIS — Z79899 Other long term (current) drug therapy: Secondary | ICD-10-CM | POA: Insufficient documentation

## 2017-05-05 DIAGNOSIS — E039 Hypothyroidism, unspecified: Secondary | ICD-10-CM | POA: Insufficient documentation

## 2017-05-05 HISTORY — DX: Tachycardia, unspecified: R00.0

## 2017-05-05 MED ORDER — FAMOTIDINE 20 MG PO TABS
20.0000 mg | ORAL_TABLET | Freq: Once | ORAL | Status: AC
  Administered 2017-05-05: 20 mg via ORAL
  Filled 2017-05-05: qty 1

## 2017-05-05 MED ORDER — DIPHENHYDRAMINE HCL 50 MG/ML IJ SOLN
25.0000 mg | Freq: Once | INTRAMUSCULAR | Status: DC
  Filled 2017-05-05: qty 1

## 2017-05-05 MED ORDER — FAMOTIDINE 20 MG PO TABS: 20.0000 mg | ORAL_TABLET | Freq: Two times a day (BID) | ORAL | 0 refills | Status: DC

## 2017-05-05 MED ORDER — ACETAMINOPHEN 500 MG PO TABS
1000.0000 mg | ORAL_TABLET | Freq: Once | ORAL | Status: AC
  Administered 2017-05-05: 1000 mg via ORAL
  Filled 2017-05-05: qty 2

## 2017-05-05 MED ORDER — CETIRIZINE HCL 10 MG PO TABS: 10.0000 mg | ORAL_TABLET | Freq: Every day | ORAL | 0 refills | Status: DC

## 2017-05-05 MED ORDER — PREDNISONE 20 MG PO TABS
40.0000 mg | ORAL_TABLET | Freq: Once | ORAL | Status: AC
  Administered 2017-05-05: 40 mg via ORAL
  Filled 2017-05-05: qty 2

## 2017-05-05 MED ORDER — DIPHENHYDRAMINE HCL 25 MG PO CAPS
25.0000 mg | ORAL_CAPSULE | Freq: Once | ORAL | Status: AC
  Administered 2017-05-05: 25 mg via ORAL
  Filled 2017-05-05: qty 1

## 2017-05-05 NOTE — ED Provider Notes (Addendum)
[facility] Provider Note   CSN: [id number] Arrival date & time: 05/05/17  0041     History   Chief Complaint Chief Complaint  Patient presents with  . Insect Bite    HPI Dana Elliott is a 36 y.o. female.  The history is provided by the patient.  Animal Bite  Contact animal:  Insect Location:  Shoulder/arm Shoulder/arm injury location:  R forearm (distal volar aspect) Pain details:    Quality:  Itching and aching   Severity:  Moderate   Timing:  Constant   Progression:  Unchanged Incident location:  Home Tetanus status:  Up to date Relieved by:  Nothing Worsened by:  Nothing Ineffective treatments: ibuprofen. Associated symptoms: no fever, no numbness and no rash   Skin lesion on volar forearm.  States it happened before 11 pm when her daughter pulled out her refrigerator and she was trying to put it in place and her significant other said they should go out for coffee and it became very itchy in the car and now she is stating there is pain shooting up the arm.  Moreover, she states her significant other is coming to get her.     Past Medical History:  Diagnosis Date  . Abnormal Pap smear   . Allergy   . Anxiety   . Chronic neck pain   . Depression   . Drug-seeking behavior   . Headache   . Interstitial cystitis   . Narcotic abuse   . Ovarian cyst   . PID (acute pelvic inflammatory disease)   . Rheumatoid arthritis(714.0)   . Tachycardia   . Thyroid disease     Patient Active Problem List   Diagnosis Date Noted  . Opiate dependence (HCC) 03/16/2014  . Major depression 03/16/2014  . Ache in joint 02/08/2013  . MDD (major depressive disorder) 11/16/2011  . Anxiety 11/16/2011  . Rheumatoid arthritis(714.0) 11/16/2011  . LSIL (low grade squamous intraepithelial lesion) on Pap smear 11/16/2011    Past Surgical History:  Procedure Laterality Date  . ABDOMINAL HYSTERECTOMY    . LAPAROSCOPIC TUBAL LIGATION    . TUBAL LIGATION      OB History     Gravida Para Term Preterm AB Living   4 2 1 1 2 2    SAB TAB Ectopic Multiple Live Births     2             Home Medications    Prior to Admission medications   Medication Sig Start Date End Date Taking? Authorizing Provider  azaTHIOprine (IMURAN) 50 MG tablet Take 50 mg by mouth daily. 09/12/15   [provider]  erythromycin ophthalmic ointment Place a 1/2 inch ribbon of ointment into the lower eyelid 4 times daily for 5 days 04/12/17   Bethel Born, PA-C  HYDROcodone-acetaminophen (NORCO/VICODIN) 5-325 MG tablet Take 1 tablet by mouth every 6 (six) hours as needed for severe pain. 04/12/17   Bethel Born, PA-C  ibuprofen (ADVIL,MOTRIN) 200 MG tablet Take 600-800 mg by mouth every 6 (six) hours as needed for moderate pain.    [provider]  levothyroxine (SYNTHROID, LEVOTHROID) 175 MCG tablet Take 1 tablet (175 mcg total) by mouth daily. For low thyroid function 12/25/14   Lurene Shadow, PA-C  pentosan polysulfate (ELMIRON) 100 MG capsule Take 100 mg by mouth 3 (three) times daily.    [provider]  PRESCRIPTION MEDICATION Percocet (states is an old prescription)    [provider]  Family History Family History  Problem Relation Age of Onset  . Diabetes Mother   . Hypertension Mother   . Alcohol abuse Paternal Grandmother   . Hypothyroidism Paternal Grandfather   . Other Neg Hx     Social History Social History  Substance Use Topics  . Smoking status: Former Smoker    Quit date: 03/16/2013  . Smokeless tobacco: Never Used  . Alcohol use No     Allergies   Penicillins; Tramadol; Reglan [metoclopramide]; and Toradol [ketorolac tromethamine]   Review of Systems Review of Systems  Constitutional: Negative for fever.  HENT: Negative for drooling and facial swelling.   Respiratory: Negative for shortness of breath and wheezing.   Cardiovascular: Negative for chest pain.  Skin: Negative for rash.  Neurological:  Negative for numbness.  All other systems reviewed and are negative.    Physical Exam Updated Vital Signs BP 130/90 (BP Location: Left Arm)   Pulse (!) 123   Temp 98.5 F (36.9 C) (Oral)   Resp (!) 24   Ht 5\' 6"  (1.676 m)   Wt 83.9 kg (185 lb)   LMP 07/28/2015   SpO2 100%   BMI 29.86 kg/m   Physical Exam  Constitutional: She is oriented to person, place, and time. She appears well-developed and well-nourished. No distress.  HENT:  Head: Normocephalic and atraumatic.  Mouth/Throat: Oropharynx is clear and moist. No oropharyngeal exudate.  No swelling of the lips tongue or uvula  Eyes: Conjunctivae and EOM are normal.  Dilated to 9 mm in the light,  Neck: Normal range of motion. Neck supple. No JVD present.  Cardiovascular: Regular rhythm, normal heart sounds and intact distal pulses.  Tachycardia present.   Pulmonary/Chest: Effort normal and breath sounds normal. No stridor. No respiratory distress. She has no wheezes. She has no rales.  Abdominal: Soft. Bowel sounds are normal. She exhibits no mass. There is no tenderness. There is no rebound and no guarding.  Musculoskeletal: Normal range of motion. She exhibits no edema or tenderness.       Right wrist: Normal.       Right hand: Normal. She exhibits normal capillary refill. Normal sensation noted. Normal strength noted.  B forearm and hands of same warmth, and color.  Cap refill < 2 sec.  3+ radial pulses in B wrists.    Neurological: She is alert and oriented to person, place, and time. She displays normal reflexes.  Skin: Capillary refill takes less than 2 seconds. No erythema.  Isolated 2 cm flat top lesion without warmth erythema or fluctuance consistent with insect envenomation.  There are no signs of target lesion no signs of 2 punctures.    Psychiatric: She is aggressive.     ED Treatments / Results   Vitals:   05/05/17 0054 05/05/17 0253  BP: 125/86 130/90  Pulse: (!) 139 (!) 123  Resp: (!) 22 (!) 24  Temp:  98.5 F (36.9 C)     Radiology No results found.  Procedures Procedures (including critical care time)  Medications Ordered in ED Medications  acetaminophen (TYLENOL) tablet 1,000 mg (1,000 mg Oral Given 05/05/17 0334)  famotidine (PEPCID) tablet 20 mg (20 mg Oral Given 05/05/17 0334)  predniSONE (DELTASONE) tablet 40 mg (40 mg Oral Given 05/05/17 0334)  diphenhydrAMINE (BENADRYL) capsule 25 mg (25 mg Oral Given 05/05/17 0340)     DEA database reviewed multiple outstanding RX from multiple providers.   04/29/2017 04/29/2017 HYDROCODONE- ACETAMIN 5- 325 MG [id number] 21 5 0 0  [id number] HEARNE CHRISTY C (AGNP-C) [place], [place] [id number] [facility] [place], [place] Tinnel, Kioni 1981-08-14 [address] [place], [place] [address] 04 21 04/29/2017 04/29/2017 ALPRAZOLAM 1 MG TABLET [id number] 30 30 0 0 [id number] HEARNE CHRISTY C (AGNP-C) [place], Ruth [id number] [facility] [place], [place] Broda, Mayla 04/28/1981 [address] [place], [place] [address] 04 0 04/12/2017 04/12/2017 HYDROCODONE- ACETAMIN 5- 325 MG [id number] 5 1 0 0 [address] [place], [place] [id number] Jearld Fenton, Beatrice Novak, Lucie August 20, 1981 [address] [place], [place] [address] 04 25 04/09/2017 04/09/2017 PHENTERMINE 37.5 MG TABLET [id number] 45 30 0 0 [id number] VAN EYK Ocie Bob MD [place], [place] [id number] [facility] [phone] [place], [place] Maue, Diamantina 15-Apr-1981 [address] [place], [place] [address] 01 0 04/09/2017 04/09/2017 HYDROCODONE- ACETAMIN 5- 325 MG [id number] 12 3 0 0 [id number] VAN EYK Ocie Bob MD [place], [place] [id number] [facility] [phone] [place], [place] Mimbs, Nevena 1981/09/17 [address] [place], [place] [address] 01 20 03/08/2017 03/08/2017 PHENTERMINE 37.5 MG TABLET [id number] 45 45 0 0 [id number] VAN EYK Ocie Bob MD [place], [place] [id number] [facility] [phone] [place] ([place]), [place]  [place], Maeson 14-Nov-1980 [address] [place], [place] [address] 01 0 03/08/2017 03/08/2017 HYDROCODONE- ACETAMIN 5- 325 MG [id number] 20 5 0 0 [id number] VAN EYK Ocie Bob MD [place], [place] [id number] [facility] 06-1497 [place]), [place], [place] [birth year] [address] [place], [place] [address] 01 20 03/02/2017 03/02/2017 ACETAMINOPHEN- COD #3 TABLET [id number] 8 2 0 0 [id number] [place] CLEMMONS, Winder [id number] [facility] [place], [place], Nysia [birth year] [address] [place], [place] [address] 01 18 02/10/2017 02/10/2017 HYDROCODONE- ACETAMIN 5- 325 MG [id number] 6 1 0 0 [id number] [address] [place], [place] [id number] [facility] [place], [place] Wiss[place], Nekisha 1981-03-31 [address] [place], [place] [address] 04 30 01/06/2017 11/06/2016 PHENTERMINE 37.5 MG TABLET [id number] 45 30 1 1  [id number] VAN EYK Ocie Bob MD [place], [place] [id number] [facility] [phone] [place], [place] Raigoza, Princes 1981-04-[address] [place], [place] [address] 01 UNK 12/06/2016 11/06/2016 PHENTERMINE 37.5 MG TABLET [id number] 45 30 0 1 [id number] VAN EYK Ocie Bob MD [place], [place] [id number] [facility] [id number] [place], [place] Elvin, Brittain [birth year][address] [place], [place] [address] 01 UNK 11/08/2016 11/06/2016 PHENTERMINE 37.5 MG TABLET [id number] 45 30 0 2 [id number] VAN EYK Ocie Bob MD [place], [place] [id number] [facility] Ginette Otto, [place] Lister, Kenlyn [birth year][address] [place], [place] [address] 01 UNK 11/06/2016 11/06/2016 HYDROCODONE- ACETAMIN 5- 325 MG [id number] 30 8 0 0 [id number] VAN EYK Ocie Bob MD [place], [place] [id number] [facility] [phone] [place], [place] MCPHAIL, Shi Mar 24, 1981 [address] [place], [place] [address] 04 18.75 10/24/2016 09/02/2016 PHENTERMINE 37.5 MG TABLET [id number] 22 13 1  0 [id number] VAN  EYK Ocie Bob MD [place], [place] [id number] [facility] [phone] [place], [place]AT[place], Nusaiba [birth year] [address] [place], [place] [address] 04 UNK 10/01/2016 09/02/2016 PHENTERMINE 37.5 MG TABLET [id number] 23 14 0 0 [id number] VAN EYK Ocie Bob MD [place], [place] [id number] [facility] [phone] [place], [place] NEALON, Kollyns [birth year] [address] [place], [place] [address] 04 UNK 09/02/2016 09/02/2016 PHENTERMINE 37.5 MG TABLET [id number] 45 30 0 0 [id number] VAN EYK Ocie Bob MD [place], [place] [id number] [facility] [phone] [place], [place] Thakur, Lyra Feb 16, 1981 [address] [place], [place] [address] 04 UNK 09/02/2016 09/02/2016 HYDROCODONE- ACETAMIN 5- 325 MG [id number] 30 8 0 0 [id number] VAN EYK Ocie Bob MD [place], [place] [id number] [facility] [phone] [place], [place] Mcshea, Elliet [birth year] [address] [place], [place] [address] 04 18.75 08/14/2016 07/17/2016 ALPRAZOLAM 1 MG TABLET [id number] 30 30 0 0 [id number] VAN EYK Ocie Bob MD [place], [place] [id number] [facility] [phone] [place], [place] Paolo, Shaquoia [birth year] [address] [place], [place] [address] 04 UNK 08/07/2016 08/07/2016 HYDROCODONE- ACETAMIN 5- 325 MG [id number] 30 8 0 0 [id number] VAN EYK Ocie Bob MD [place], [place] [id number] [facility] [phone] [place], [place] Demilio, Vici Apr 08, 1981 [address] [place], [place] [address] 04 18.75 07/30/2016 07/30/2016 PHENTERMINE 37.5 MG TABLET [id number] 45 30 0 0 [id number] VAN EYK Ocie Bob MD [place], [place] [id number] [facility] [phone] [place], [place] Ebers, Briggett Jul 14, 1981 [address] [place], [place] [address] 04 UNK 07/30/2016 07/30/2016 HYDROCODONE- ACETAMIN 5- 325 MG [id number] 30 8 0 0 [id number] VAN EYK Ocie Bob MD [place], [place] [id number] [facility] [phone] [place], [place] Mcdevitt, Lamona 1981-09-16 [address] [place], [place] [address] 04 18.75  07/17/2016 07/17/2016 PHENTERMINE 37.5 MG TABLET [id number] 30 30 0 0 [id number] VAN EYK Ocie Bob MD [place], [place] [id number] [facility] [phone] [place], [place] Delpizzo, Anahid 1980-11-17 [address] [place], [place] [address] 04 UNK 07/17/2016 07/17/2016 HYDROCODONE- ACETAMIN 5- 325 MG [id number] 20 5 0 0 [id number] VAN EYK Ocie Bob MD [place], [place] [id number] [facility] [phone] [place], [place] Saldierna, Kinaya 09/18/1981 [address] [place], [place] [address] 04 20 07/17/2016 07/17/2016 ALPRAZOLAM 1 MG TABLET [id number] 30 30 0 1 [id number] VAN EYK Ocie Bob MD [place], [place] [id number] [facility] [phone] [place] Salvino, Andreka [birth year] [address] [place], [place] [address] 04 UNK 07/15/2016 07/15/2016 HYDROCODONE- ACETAMIN 5- 325 MG [id number] 6 1 0 0 [id number] [place] Anselm Pancoast NP [place], [place] [id number] Jearld Fenton, [place] Newsom, Emireth June 11, 1981 [address] SEE COMMENT [place], [place] [address] 04 30 07/09/2016 07/09/2016 VICODIN 5- 300 MG TABLET [id number] 5 1 0 0 [id number] PUGH SCOTT ZACHARY [place], [place] [id number] [facility] [place], [place] Hertzberg, Valmai 1980-10-14 [address] [place], [place] [address] 04 25 06/16/2016 06/16/2016 HYDROCODONE- ACETAMIN 5- 300 MG [id number] 10 2 0 0 [id number] PUGH SCOTT ZACHARY [place], [place] [id number] [facility] [phone] [place], [place] Cattell, Jakirah 04/16/1981 [address] [place], [place] [address] 04 25 06/11/2016 06/11/2016 HYDROCODONE- ACETAMIN 5- 325 MG [id number] 12 2 0 0 [id number] GUPTON JULIE S (PA-C) [place], [place] [id number] [facility] [phone] [place], [place] Affinito, Wafa 10-05-1980 [address] [place], [place] [address] 04 30 06/03/2016 06/03/2016 TRAMADOL HCL 50 MG TABLET [id number] 6 2 0 0 [id number] [place]NTWIST[place] CELIA [place], [place] [id number] [place] [facility] [place], [place] Willig,  Hanni Mar 06, 1981 [address] [place], [place] [address] 01 15 05/26/2016 05/26/2016 TRAMADOL HCL 50 MG TABLET [id number] 8 3 0 0 [id number] Eber Hong D MD [place], [place] [id number] [facility] [phone] [place],  [place], Latamara 03-Aug-1981 [address] [place], [place] [address] 04 13.33 05/23/2016 05/23/2016 TRAMADOL HCL 50 MG TABLET [id number]  Final Clinical Impressions(s) / ED Diagnoses  Insect envenomation:  Patient has been tachycardiac on multiple previous visits. This is her baseline.   I do not believe this  is a spider bite nor a tick bite.  I see no signs of cellulitis nor abscess.  Patient is on steroids we will start 2 antihistamines to treat this condition.  Patient has repeatedly asked for pain medication and this is not indicated in this case.  I believe this drug seeking as her RX for norco on 8/2/ likely ran out today.  _Please she the [employer] has confirmed multiple outstanding RX from multiple providers and the most recent one fille 04/29/17 would have run out today but I do not feel it prudent nor merited that she receive another.  I have advised recheck within 48 hours with her PMD and have given strict return precautions.  Nurse was present during the disposition.     She is very well appearing and has been observed in the ED.  Strict return precautions given for facial swelling,  wheezing, cough fevers, drooling, swelling of the mouth or throat, vomiting, distended abdomen, fevers, target rash, streaking up the arm, weakness, inability to tolerate oral liquids or foods, shortness of breath, leg, syncope or any concerns. No signs of systemic illness or infection. The patient is nontoxic-appearing on exam and vital signs are within normal limits.     I have reviewed the triage vital signs and the nursing notes. Pertinent labs &imaging results that were available during my care of the patient were reviewed by me and considered in my medical  decision making (see chart for details).  After history, exam, and medical workup I feel the patient has been appropriately medically screened and is safe for discharge home. Pertinent diagnoses were discussed with the patient. Patient was given return precautions.      Burnett Spray, MD 05/05/17 Loyal Gambler, Geneviene Tesch, MD 05/05/17 0092

## 2017-05-05 NOTE — ED Notes (Signed)
Pt informed Dr. Nicanor Alcon at the time when her medication was ordered that her "ride was on the way."

## 2017-05-05 NOTE — ED Notes (Signed)
Pt verbalizes understanding of dc instructions and ambulated to the waiting room without assistance.

## 2017-05-05 NOTE — ED Triage Notes (Signed)
Pt reports something bit her on her right arm 45 min PTA. Pt has area that is red, raised and bruised. Pt c/o extreme pain in right arm and nausea.

## 2017-05-05 NOTE — ED Notes (Signed)
Pt has raised area with what appears to be an insect bite on right forearm.  There is no fluctuance to area, no red streaking up arm and the area has not changed since arrival.  Capillary refill is normal and radial pulse is strong.

## 2017-05-05 NOTE — ED Notes (Addendum)
At the time that the patient was given benadryl, she stated that she had a ride.  Pt is at the desk at this time asking for a "cab voucher" because she does not have a ride.  Pt states she knows we have cab vouchers because she has used them before.  Pt is informed that she is welcome to call a cab or wait in the waiting room until someone answers to come pick her up.

## 2017-05-05 NOTE — ED Notes (Addendum)
Pt states she can't take IM injections, is also wondering what else she is getting for pain.

## 2018-02-16 ENCOUNTER — Emergency Department (HOSPITAL_COMMUNITY): Admission: EM | Admit: 2018-02-16 | Discharge: 2018-02-16 | Discharge status: Absent without leave | Payer: MEDICAID

## 2018-04-07 ENCOUNTER — Ambulatory Visit (HOSPITAL_COMMUNITY)
Admission: EM | Admit: 2018-04-07 | Discharge: 2018-04-07 | Disposition: A | Discharge status: Home / Self Care | Payer: Self-pay | Attending: Family Medicine | Admitting: Family Medicine

## 2018-04-07 ENCOUNTER — Encounter (HOSPITAL_COMMUNITY): Payer: Self-pay | Admitting: Emergency Medicine

## 2018-04-07 DIAGNOSIS — M25512 Pain in left shoulder: Secondary | ICD-10-CM

## 2018-04-07 DIAGNOSIS — G8929 Other chronic pain: Secondary | ICD-10-CM

## 2018-04-07 MED ORDER — TRAMADOL HCL 50 MG PO TABS: 50.0000 mg | ORAL_TABLET | Freq: Four times a day (QID) | ORAL | 0 refills | Status: AC | PRN

## 2018-04-07 NOTE — Discharge Instructions (Addendum)
X ray is not warranted today because you had imaging done a few days ago. You have already been prescribed, prednisone, gabapentin and norflex.  Im sorry this is not helping you.  I will give you a few tramadol until you can hopefully get in with an orthopedic. I will given you another referral.  Good luck.

## 2018-04-07 NOTE — ED Triage Notes (Signed)
Pt c/o L shoulder pain, hx of arthritis, painful to move L shoulder.

## 2018-04-08 NOTE — ED Provider Notes (Signed)
[facility]    CSN: [id number] Arrival date & time: 04/07/18  1455     History   Chief Complaint Chief Complaint  Patient presents with  . Shoulder Injury    HPI Dana Elliott is a 37 y.o. female.   Patient is a 37 year old female that presents with chronic left shoulder pain. She has a history of narcotic abuse, opiate dependence, anxiety, depression.     She was seen by orthopedic 2 days ago and had imaging done on neck and shoulder.  Her x-rays were normal.  She was given gabapentin for nerve pain.  She reports the gabapentin does not work and she does not like the way it makes her feel.  She has also been taking prednisone and muscle relaxers.  She reports that the pain is not getting better and has worsened.  She has had increased numbness and tingling in her left arm.  She has an appointment in 4 weeks to see a specialist.  She has many allergies to different pain medications.    Past Medical History:  Diagnosis Date  . Abnormal Pap smear   . Allergy   . Anxiety   . Chronic neck pain   . Depression   . Drug-seeking behavior   . Headache   . Interstitial cystitis   . Narcotic abuse (HCC)   . Ovarian cyst   . PID (acute pelvic inflammatory disease)   . Rheumatoid arthritis(714.0)   . Tachycardia   . Thyroid disease     Patient Active Problem List   Diagnosis Date Noted  . Opiate dependence (HCC) 03/16/2014  . Major depression 03/16/2014  . Ache in joint 02/08/2013  . MDD (major depressive disorder) 11/16/2011  . Anxiety 11/16/2011  . Rheumatoid arthritis(714.0) 11/16/2011  . LSIL (low grade squamous intraepithelial lesion) on Pap smear 11/16/2011    Past Surgical History:  Procedure Laterality Date  . ABDOMINAL HYSTERECTOMY    . LAPAROSCOPIC TUBAL LIGATION    . TUBAL LIGATION      OB History    Gravida  4   Para  2   Term  1   Preterm  1   AB  2   Living  2     SAB      TAB  2   Ectopic      Multiple      Live Births              Home Medications    Prior to Admission medications   Medication Sig Start Date End Date Taking? Authorizing Provider  azaTHIOprine (IMURAN) 50 MG tablet Take 50 mg by mouth daily. 09/12/15   [provider]  cetirizine (ZYRTEC ALLERGY) 10 MG tablet Take 1 tablet (10 mg total) by mouth daily. Patient not taking: Reported on 04/07/2018 05/05/17   Palumbo, April, MD  erythromycin ophthalmic ointment Place a 1/2 inch ribbon of ointment into the lower eyelid 4 times daily for 5 days 04/12/17   Bethel Born, PA-C  famotidine (PEPCID) 20 MG tablet Take 1 tablet (20 mg total) by mouth 2 (two) times daily. Patient not taking: Reported on 04/07/2018 05/05/17   Palumbo, April, MD  HYDROcodone-acetaminophen (NORCO/VICODIN) 5-325 MG tablet Take 1 tablet by mouth every 6 (six) hours as needed for severe pain. Patient not taking: Reported on 04/07/2018 04/12/17   Bethel Born, PA-C  ibuprofen (ADVIL,MOTRIN) 200 MG tablet Take 600-800 mg by mouth every 6 (six) hours as needed for moderate pain.  [provider]  levothyroxine (SYNTHROID, LEVOTHROID) 175 MCG tablet Take 1 tablet (175 mcg total) by mouth daily. For low thyroid function 12/25/14   Lurene Shadow, PA-C  pentosan polysulfate (ELMIRON) 100 MG capsule Take 100 mg by mouth 3 (three) times daily.    [provider]  PRESCRIPTION MEDICATION Percocet (states is an old prescription)    [provider]  traMADol (ULTRAM) 50 MG tablet Take 1 tablet (50 mg total) by mouth every 6 (six) hours as needed for up to 3 days. 04/07/18 04/10/18  Janace Aris, NP    Family History Family History  Problem Relation Age of Onset  . Diabetes Mother   . Hypertension Mother   . Alcohol abuse Paternal Grandmother   . Hypothyroidism Paternal Grandfather   . Other Neg Hx     Social History Social History   Tobacco Use  . Smoking status: Former Smoker    Last attempt to quit: 03/16/2013    Years since  quitting: 5.0  . Smokeless tobacco: Never Used  Substance Use Topics  . Alcohol use: No  . Drug use: No     Allergies   Penicillins; Tramadol; Reglan [metoclopramide]; and Toradol [ketorolac tromethamine]   Review of Systems Review of Systems  Constitutional: Negative for chills and fever.  Respiratory: Negative for cough, chest tightness and shortness of breath.   Cardiovascular: Negative for chest pain, palpitations and leg swelling.  Musculoskeletal: Positive for arthralgias and joint swelling. Negative for neck pain and neck stiffness.  Skin: Negative for color change, pallor, rash and wound.     Physical Exam Triage Vital Signs ED Triage Vitals [04/07/18 1508]  Enc Vitals Group     BP (!) 120/92     Pulse Rate (!) 119     Resp 18     Temp 98.4 F (36.9 C)     Temp Source Oral     SpO2 100 %     Weight      Height      Head Circumference      Peak Flow      Pain Score      Pain Loc      Pain Edu?      Excl. in GC?    No data found.  Updated Vital Signs BP (!) 120/92 (BP Location: Right Arm)   Pulse (!) 119 Comment: pt sts normal for pain tolerance   Temp 98.4 F (36.9 C) (Oral)   Resp 18   LMP 07/28/2015   SpO2 100%   Visual Acuity Right Eye Distance:   Left Eye Distance:   Bilateral Distance:    Right Eye Near:   Left Eye Near:    Bilateral Near:     Physical Exam  Constitutional: She is oriented to person, place, and time. She appears well-developed and well-nourished. She appears distressed.  Neck: Normal range of motion. Neck supple.  Cardiovascular: Normal rate and regular rhythm.  Pulmonary/Chest: Effort normal and breath sounds normal.  Musculoskeletal: She exhibits edema and tenderness.  Limited range of motion in left arm due to pain in her left shoulder.  Slight swelling in shoulder and trapezius muscle.  No erythema, ecchymosis, or warmth to shoulder.   Neurological: She is alert and oriented to person, place, and time.  Skin: Skin  is warm and dry. Capillary refill takes less than 2 seconds.  Psychiatric: She has a normal mood and affect.     UC Treatments / Results  Labs (  all labs ordered are listed, but only abnormal results are displayed) Labs Reviewed - No data to display  EKG None  Radiology No results found.  Procedures Procedures (including critical care time)  Medications Ordered in UC Medications - No data to display  Initial Impression / Assessment and Plan / UC Course  I have reviewed the triage vital signs and the nursing notes.  Pertinent labs & imaging results that were available during my care of the patient were reviewed by me and considered in my medical decision making (see chart for details).    Chronic shoulder pain.  No concern for septic joint. No imaging needed. Tramadol for pain. Told to follow up with ortho for further management of pain.  Final Clinical Impressions(s) / UC Diagnoses   Final diagnoses:  Chronic pain in left shoulder     Discharge Instructions     X ray is not warranted today because you had imaging done a few days ago. You have already been prescribed, prednisone, gabapentin and norflex.  Im sorry this is not helping you.  I will give you a few tramadol until you can hopefully get in with an orthopedic. I will given you another referral.  Good luck.    ED Prescriptions    Medication Sig Dispense Auth. Provider   traMADol (ULTRAM) 50 MG tablet Take 1 tablet (50 mg total) by mouth every 6 (six) hours as needed for up to 3 days. 12 tablet Dahlia Byes A, NP     Controlled Substance Prescriptions [place] Controlled Substance Registry consulted? Yes, I have consulted the [place] Controlled Substances Registry for this patient, and feel the risk/benefit ratio today is favorable for proceeding with this prescription for a controlled substance.   Janace Aris, NP 04/08/18 1559

## 2018-06-04 ENCOUNTER — Emergency Department (HOSPITAL_COMMUNITY): Payer: Self-pay

## 2018-06-04 ENCOUNTER — Encounter (HOSPITAL_COMMUNITY): Payer: Self-pay | Admitting: *Deleted

## 2018-06-04 ENCOUNTER — Other Ambulatory Visit: Payer: Self-pay

## 2018-06-04 DIAGNOSIS — M25512 Pain in left shoulder: Secondary | ICD-10-CM | POA: Insufficient documentation

## 2018-06-04 DIAGNOSIS — Z79899 Other long term (current) drug therapy: Secondary | ICD-10-CM | POA: Insufficient documentation

## 2018-06-04 DIAGNOSIS — Z87891 Personal history of nicotine dependence: Secondary | ICD-10-CM | POA: Insufficient documentation

## 2018-06-04 NOTE — ED Triage Notes (Signed)
Pt c/o left shoulder pain. She says it has been inflamed and hurting for a while, but tonight she reached down and she felt a pop. Pt does have a small bruise on the shoulder, denies any type of specific injury. No meds PTA.

## 2018-06-05 ENCOUNTER — Emergency Department (HOSPITAL_COMMUNITY)
Admission: EM | Admit: 2018-06-05 | Discharge: 2018-06-05 | Disposition: A | Discharge status: Home / Self Care | Payer: Self-pay | Attending: Emergency Medicine | Admitting: Emergency Medicine

## 2018-06-05 DIAGNOSIS — M25512 Pain in left shoulder: Secondary | ICD-10-CM

## 2018-06-05 MED ORDER — LIDOCAINE 5 % EX PTCH
1.0000 | MEDICATED_PATCH | CUTANEOUS | Status: DC
  Administered 2018-06-05: 1 via TRANSDERMAL
  Filled 2018-06-05: qty 1

## 2018-06-05 MED ORDER — ACETAMINOPHEN ER 650 MG PO TBCR: 650.0000 mg | EXTENDED_RELEASE_TABLET | Freq: Three times a day (TID) | ORAL | 0 refills | Status: AC | PRN

## 2018-06-05 MED ORDER — OXYCODONE-ACETAMINOPHEN 5-325 MG PO TABS
1.0000 | ORAL_TABLET | Freq: Once | ORAL | Status: AC
  Administered 2018-06-05: 1 via ORAL
  Filled 2018-06-05: qty 1

## 2018-06-05 MED ORDER — ACETAMINOPHEN 325 MG PO TABS
650.0000 mg | ORAL_TABLET | Freq: Once | ORAL | Status: AC
  Administered 2018-06-05: 650 mg via ORAL
  Filled 2018-06-05: qty 2

## 2018-06-05 MED ORDER — IBUPROFEN 600 MG PO TABS
600.0000 mg | ORAL_TABLET | Freq: Four times a day (QID) | ORAL | 0 refills | Status: AC | PRN
Start: 2018-06-05 — End: ?

## 2018-06-05 MED ORDER — DIAZEPAM 5 MG PO TABS
5.0000 mg | ORAL_TABLET | Freq: Once | ORAL | Status: AC
  Administered 2018-06-05: 5 mg via ORAL
  Filled 2018-06-05: qty 1

## 2018-06-05 MED ORDER — LIDOCAINE 5 % EX PTCH: 1.0000 | MEDICATED_PATCH | CUTANEOUS | Status: DC

## 2018-06-05 MED ORDER — NAPROXEN 500 MG PO TABS
500.0000 mg | ORAL_TABLET | Freq: Once | ORAL | Status: AC
  Administered 2018-06-05: 500 mg via ORAL
  Filled 2018-06-05: qty 1

## 2018-06-05 NOTE — ED Provider Notes (Signed)
[facility]-EMERGENCY DEPT Provider Note   CSN: [id number] Arrival date & time: 06/04/18  2236     History   Chief Complaint Chief Complaint  Patient presents with  . Shoulder Pain    HPI Dana Elliott is a 37 y.o. female.  HPI  37 year old female comes in with chief complaint of left-sided shoulder pain. Patient has history of arthritis and chronic shoulder pain.  She reports that prior to ED arrival she was trying to turn on the water faucet for the washing machine while being in awkward position -and when doing so she started having severe pain.  Patient heard a loud pop before she started having pain.  Pain has been constant and located over the axillary region, shoulder and scapular region.  She denies any associated numbness, tingling.  No history of similar pain.  Past Medical History:  Diagnosis Date  . Abnormal Pap smear   . Allergy   . Anxiety   . Chronic neck pain   . Depression   . Drug-seeking behavior   . Headache   . Interstitial cystitis   . Narcotic abuse (HCC)   . Ovarian cyst   . PID (acute pelvic inflammatory disease)   . Rheumatoid arthritis(714.0)   . Tachycardia   . Thyroid disease     Patient Active Problem List   Diagnosis Date Noted  . Opiate dependence (HCC) 03/16/2014  . Major depression 03/16/2014  . Ache in joint 02/08/2013  . MDD (major depressive disorder) 11/16/2011  . Anxiety 11/16/2011  . Rheumatoid arthritis(714.0) 11/16/2011  . LSIL (low grade squamous intraepithelial lesion) on Pap smear 11/16/2011    Past Surgical History:  Procedure Laterality Date  . ABDOMINAL HYSTERECTOMY    . LAPAROSCOPIC TUBAL LIGATION    . TUBAL LIGATION       OB History    Gravida  4   Para  2   Term  1   Preterm  1   AB  2   Living  2     SAB      TAB  2   Ectopic      Multiple      Live Births               Home Medications    Prior to Admission medications   Medication Sig Start Date End Date  Taking? Authorizing Provider  acetaminophen (TYLENOL 8 HOUR) 650 MG CR tablet Take 1 tablet (650 mg total) by mouth every 8 (eight) hours as needed. 06/05/18   Derwood Kaplan, MD  azaTHIOprine (IMURAN) 50 MG tablet Take 50 mg by mouth daily. 09/12/15   [provider]  cetirizine (ZYRTEC ALLERGY) 10 MG tablet Take 1 tablet (10 mg total) by mouth daily. Patient not taking: Reported on 04/07/2018 05/05/17   Palumbo, April, MD  erythromycin ophthalmic ointment Place a 1/2 inch ribbon of ointment into the lower eyelid 4 times daily for 5 days 04/12/17   Bethel Born, PA-C  famotidine (PEPCID) 20 MG tablet Take 1 tablet (20 mg total) by mouth 2 (two) times daily. Patient not taking: Reported on 04/07/2018 05/05/17   Palumbo, April, MD  HYDROcodone-acetaminophen (NORCO/VICODIN) 5-325 MG tablet Take 1 tablet by mouth every 6 (six) hours as needed for severe pain. Patient not taking: Reported on 04/07/2018 04/12/17   Bethel Born, PA-C  ibuprofen (ADVIL,MOTRIN) 600 MG tablet Take 1 tablet (600 mg total) by mouth every 6 (six) hours as needed. 06/05/18   Chaitra Mast,  Nereyda Bowler, MD  levothyroxine (SYNTHROID, LEVOTHROID) 175 MCG tablet Take 1 tablet (175 mcg total) by mouth daily. For low thyroid function 12/25/14   Lurene Shadow, PA-C  pentosan polysulfate (ELMIRON) 100 MG capsule Take 100 mg by mouth 3 (three) times daily.    [provider]  PRESCRIPTION MEDICATION Percocet (states is an old prescription)    [provider]    Family History Family History  Problem Relation Age of Onset  . Diabetes Mother   . Hypertension Mother   . Alcohol abuse Paternal Grandmother   . Hypothyroidism Paternal Grandfather   . Other Neg Hx     Social History Social History   Tobacco Use  . Smoking status: Former Smoker    Last attempt to quit: 03/16/2013    Years since quitting: 5.2  . Smokeless tobacco: Never Used  Substance Use Topics  . Alcohol use: No  . Drug use: No      Allergies   Penicillins; Tramadol; Reglan [metoclopramide]; and Toradol [ketorolac tromethamine]   Review of Systems Review of Systems  Constitutional: Positive for activity change.  Musculoskeletal: Positive for arthralgias and myalgias.  Allergic/Immunologic: Negative for immunocompromised state.  Neurological: Negative for numbness.     Physical Exam Updated Vital Signs BP (!) 132/94   Pulse 98   Temp 98.5 F (36.9 C) (Oral)   Resp 16   Ht 5\' 6"  (1.676 m)   Wt 77.1 kg   LMP 07/28/2015   SpO2 100%   BMI 27.44 kg/m   Physical Exam  Constitutional: She is oriented to person, place, and time. She appears well-developed.  HENT:  Head: Normocephalic and atraumatic.  Eyes: EOM are normal.  Neck: Normal range of motion. Neck supple.  Cardiovascular: Normal rate and intact distal pulses.  Pulmonary/Chest: Effort normal.  Abdominal: Bowel sounds are normal.  Musculoskeletal: She exhibits tenderness. She exhibits no edema or deformity.  Motor exam of the left shoulder is limited because of patient's severe pain preventing her from participating.  Patient able to flex and extend over the elbow and pronate supinate with flexed elbow. With her elbow in flexed position she is also able to internally and externally rotate the shoulder with discomfort.  Patient is not tolerating passive abduction or forward flexion -so that exam has been deferred.  There is no ecchymosis appreciated or significant edema over the shoulder.  Neurological: She is alert and oriented to person, place, and time.  Skin: Skin is warm and dry.  Nursing note and vitals reviewed.    ED Treatments / Results  Labs (all labs ordered are listed, but only abnormal results are displayed) Labs Reviewed - No data to display  EKG None  Radiology Dg Shoulder Left  Result Date: 06/04/2018 CLINICAL DATA:  Left shoulder pain. Inflamed and hurting for a while but tonight felt a pop. EXAM: LEFT SHOULDER -  2+ VIEW COMPARISON:  01/31/2018 FINDINGS: There is no evidence of fracture or dislocation. There is no evidence of arthropathy or other focal bone abnormality. Soft tissues are unremarkable. IMPRESSION: Negative. Electronically Signed   By: 04/02/2018 M.D.   On: 06/04/2018 23:56    Procedures Procedures (including critical care time)  Medications Ordered in ED Medications  lidocaine (LIDODERM) 5 % 1 patch (1 patch Transdermal Patch Applied 06/05/18 0221)  oxyCODONE-acetaminophen (PERCOCET/ROXICET) 5-325 MG per tablet 1 tablet (1 tablet Oral Given 06/05/18 0221)  naproxen (NAPROSYN) tablet 500 mg (500 mg Oral Given 06/05/18 0221)  acetaminophen (TYLENOL) tablet 650  mg (650 mg Oral Given 06/05/18 0221)  diazepam (VALIUM) tablet 5 mg (5 mg Oral Given 06/05/18 0221)     Initial Impression / Assessment and Plan / ED Course  I have reviewed the triage vital signs and the nursing notes.  Pertinent labs & imaging results that were available during my care of the patient were reviewed by me and considered in my medical decision making (see chart for details).     37 year old female comes in with chief complaint of left-sided shoulder pain. Pain started while patient was trying to open a faucet.  Patient heard a pop followed by having constant severe pain in her shoulder without any neurologic complaints.  Patient is neurovascularly intact on my exam. X-rays are normal. Although exam is limited, we are not concerned for any acute emergent pathology for the shoulder pain.  She has been advised to follow-up with orthopedics for further diagnostic evaluation and likely PT.  Final Clinical Impressions(s) / ED Diagnoses   Final diagnoses:  Acute pain of left shoulder    ED Discharge Orders         Ordered    ibuprofen (ADVIL,MOTRIN) 600 MG tablet  Every 6 hours PRN     06/05/18 0313    acetaminophen (TYLENOL 8 HOUR) 650 MG CR tablet  Every 8 hours PRN     06/05/18 0313           Derwood Kaplan, MD 06/05/18 0408

## 2018-06-05 NOTE — Discharge Instructions (Signed)
Please see either the orthopedic surgeon or sports medicine doctor for prompt evaluation of your pain.  Ice the shoulder 6 times a day for 5 to 10 minutes. Take the medications prescribed for better pain control.

## 2018-06-13 ENCOUNTER — Other Ambulatory Visit: Payer: Self-pay

## 2018-06-13 ENCOUNTER — Emergency Department (HOSPITAL_COMMUNITY)
Admission: EM | Admit: 2018-06-13 | Discharge: 2018-06-13 | Disposition: A | Discharge status: Home / Self Care | Payer: Self-pay | Attending: Emergency Medicine | Admitting: Emergency Medicine

## 2018-06-13 ENCOUNTER — Emergency Department (HOSPITAL_COMMUNITY): Payer: Self-pay

## 2018-06-13 DIAGNOSIS — F1120 Opioid dependence, uncomplicated: Secondary | ICD-10-CM | POA: Insufficient documentation

## 2018-06-13 DIAGNOSIS — M549 Dorsalgia, unspecified: Secondary | ICD-10-CM | POA: Insufficient documentation

## 2018-06-13 DIAGNOSIS — Z79899 Other long term (current) drug therapy: Secondary | ICD-10-CM | POA: Insufficient documentation

## 2018-06-13 DIAGNOSIS — F419 Anxiety disorder, unspecified: Secondary | ICD-10-CM | POA: Insufficient documentation

## 2018-06-13 DIAGNOSIS — F329 Major depressive disorder, single episode, unspecified: Secondary | ICD-10-CM | POA: Insufficient documentation

## 2018-06-13 DIAGNOSIS — Z87891 Personal history of nicotine dependence: Secondary | ICD-10-CM | POA: Insufficient documentation

## 2018-06-13 MED ORDER — DIAZEPAM 5 MG PO TABS
5.0000 mg | ORAL_TABLET | Freq: Once | ORAL | Status: AC
  Administered 2018-06-13: 5 mg via ORAL
  Filled 2018-06-13: qty 1

## 2018-06-13 MED ORDER — NAPROXEN 500 MG PO TABS
500.0000 mg | ORAL_TABLET | Freq: Once | ORAL | Status: DC
  Filled 2018-06-13: qty 1

## 2018-06-13 MED ORDER — ACETAMINOPHEN 500 MG PO TABS
1000.0000 mg | ORAL_TABLET | Freq: Once | ORAL | Status: DC
  Filled 2018-06-13: qty 2

## 2018-06-13 MED ORDER — LIDOCAINE 5 % EX PTCH
1.0000 | MEDICATED_PATCH | CUTANEOUS | Status: DC
  Administered 2018-06-13: 1 via TRANSDERMAL
  Filled 2018-06-13: qty 1

## 2018-06-13 NOTE — ED Provider Notes (Signed)
[facility]-EMERGENCY DEPT Provider Note   CSN: [id number] Arrival date & time: 06/13/18  1455     History   Chief Complaint Chief Complaint  Patient presents with  . Flank Pain    HPI Dana Elliott is a 37 y.o. female.  The history is provided by the patient.  Flank Pain  This is a new problem. The current episode started 2 days ago. The problem occurs constantly. The problem has not changed since onset.Pertinent negatives include no chest pain, no abdominal pain, no headaches and no shortness of breath. Associated symptoms comments: URI symptoms and coughing causing back pain. Patient with recent left shoulder injury. The symptoms are aggravated by coughing. Nothing relieves the symptoms. She has tried acetaminophen for the symptoms. The treatment provided mild relief.  Back Pain   Pertinent negatives include no chest pain, no fever, no headaches, no abdominal pain and no dysuria.    Past Medical History:  Diagnosis Date  . Abnormal Pap smear   . Allergy   . Anxiety   . Chronic neck pain   . Depression   . Drug-seeking behavior   . Headache   . Interstitial cystitis   . Narcotic abuse (HCC)   . Ovarian cyst   . PID (acute pelvic inflammatory disease)   . Rheumatoid arthritis(714.0)   . Tachycardia   . Thyroid disease     Patient Active Problem List   Diagnosis Date Noted  . Opiate dependence (HCC) 03/16/2014  . Major depression 03/16/2014  . Ache in joint 02/08/2013  . MDD (major depressive disorder) 11/16/2011  . Anxiety 11/16/2011  . Rheumatoid arthritis(714.0) 11/16/2011  . LSIL (low grade squamous intraepithelial lesion) on Pap smear 11/16/2011    Past Surgical History:  Procedure Laterality Date  . ABDOMINAL HYSTERECTOMY    . LAPAROSCOPIC TUBAL LIGATION    . TUBAL LIGATION       OB History    Gravida  4   Para  2   Term  1   Preterm  1   AB  2   Living  2     SAB      TAB  2   Ectopic      Multiple      Live  Births               Home Medications    Prior to Admission medications   Medication Sig Start Date End Date Taking? Authorizing Provider  acetaminophen (TYLENOL) 500 MG tablet Take 1,500 mg by mouth daily as needed for moderate pain.   Yes [provider]  Cyclobenzaprine HCl (FLEXERIL PO) Take 1 tablet by mouth daily as needed (pain).   Yes [provider]  ibuprofen (ADVIL,MOTRIN) 200 MG tablet Take 200-800 mg by mouth every 6 (six) hours as needed for moderate pain.   Yes [provider]  levothyroxine (SYNTHROID, LEVOTHROID) 175 MCG tablet Take 1 tablet (175 mcg total) by mouth daily. For low thyroid function 12/25/14  Yes Phelps, Vangie Bicker, PA-C  acetaminophen (TYLENOL 8 HOUR) 650 MG CR tablet Take 1 tablet (650 mg total) by mouth every 8 (eight) hours as needed. 06/05/18   Derwood Kaplan, MD  cetirizine (ZYRTEC ALLERGY) 10 MG tablet Take 1 tablet (10 mg total) by mouth daily. Patient not taking: Reported on 04/07/2018 05/05/17   Palumbo, April, MD  erythromycin ophthalmic ointment Place a 1/2 inch ribbon of ointment into the lower eyelid 4 times daily for 5 days Patient not  taking: Reported on 06/13/2018 04/12/17   Bethel Born, PA-C  famotidine (PEPCID) 20 MG tablet Take 1 tablet (20 mg total) by mouth 2 (two) times daily. Patient not taking: Reported on 04/07/2018 05/05/17   Palumbo, April, MD  HYDROcodone-acetaminophen (NORCO/VICODIN) 5-325 MG tablet Take 1 tablet by mouth every 6 (six) hours as needed for severe pain. Patient not taking: Reported on 04/07/2018 04/12/17   Bethel Born, PA-C  ibuprofen (ADVIL,MOTRIN) 600 MG tablet Take 1 tablet (600 mg total) by mouth every 6 (six) hours as needed. 06/05/18   Derwood Kaplan, MD  pentosan polysulfate (ELMIRON) 100 MG capsule Take 100 mg by mouth 3 (three) times daily.    [provider]  PRESCRIPTION MEDICATION Percocet (states is an old prescription)    [provider]    Family  History Family History  Problem Relation Age of Onset  . Diabetes Mother   . Hypertension Mother   . Alcohol abuse Paternal Grandmother   . Hypothyroidism Paternal Grandfather   . Other Neg Hx     Social History Social History   Tobacco Use  . Smoking status: Former Smoker    Last attempt to quit: 03/16/2013    Years since quitting: 5.2  . Smokeless tobacco: Never Used  Substance Use Topics  . Alcohol use: No  . Drug use: No     Allergies   Penicillins; Tramadol; Reglan [metoclopramide]; and Toradol [ketorolac tromethamine]   Review of Systems Review of Systems  Constitutional: Negative for chills and fever.  HENT: Negative for ear pain and sore throat.   Eyes: Negative for pain and visual disturbance.  Respiratory: Negative for cough and shortness of breath.   Cardiovascular: Negative for chest pain and palpitations.  Gastrointestinal: Negative for abdominal pain and vomiting.  Genitourinary: Positive for flank pain. Negative for dysuria and hematuria.  Musculoskeletal: Positive for back pain. Negative for arthralgias.  Skin: Negative for color change and rash.  Neurological: Negative for seizures, syncope and headaches.  All other systems reviewed and are negative.    Physical Exam Updated Vital Signs  ED Triage Vitals  Enc Vitals Group     BP 06/13/18 1523 112/79     Pulse Rate 06/13/18 1523 (!) 120     Resp 06/13/18 1523 (!) 24     Temp 06/13/18 1523 97.8 F (36.6 C)     Temp Source 06/13/18 1523 Oral     SpO2 06/13/18 1523 99 %     Weight 06/13/18 1523 185 lb (83.9 kg)     Height 06/13/18 1523 5\' 6"  (1.676 m)     Head Circumference --      Peak Flow --      Pain Score 06/13/18 1528 8     Pain Loc --      Pain Edu? --      Excl. in GC? --     Physical Exam  Constitutional: She is oriented to person, place, and time. She appears well-developed and well-nourished. No distress.  HENT:  Head: Normocephalic and atraumatic.  Eyes: Pupils are equal,  round, and reactive to light. Conjunctivae and EOM are normal.  Neck: Normal range of motion. Neck supple.  Cardiovascular: Normal rate, regular rhythm, normal heart sounds and intact distal pulses.  No murmur heard. Pulmonary/Chest: Effort normal and breath sounds normal. No respiratory distress.  Abdominal: Soft. There is no tenderness.  Musculoskeletal: Normal range of motion. She exhibits tenderness (ttp to paraspinal thoracic muscles, no midline spinal tenderness). She  exhibits no edema.  Neurological: She is alert and oriented to person, place, and time.  Skin: Skin is warm and dry.  Psychiatric: She has a normal mood and affect.  Nursing note and vitals reviewed.    ED Treatments / Results  Labs (all labs ordered are listed, but only abnormal results are displayed) Labs Reviewed - No data to display  EKG None  Radiology No results found.  Procedures Procedures (including critical care time)  Medications Ordered in ED Medications  lidocaine (LIDODERM) 5 % 1 patch (1 patch Transdermal Patch Applied 06/13/18 1534)  acetaminophen (TYLENOL) tablet 1,000 mg (1,000 mg Oral Refused 06/13/18 1533)  naproxen (NAPROSYN) tablet 500 mg (500 mg Oral Refused 06/13/18 1533)  diazepam (VALIUM) tablet 5 mg (5 mg Oral Given 06/13/18 1747)     Initial Impression / Assessment and Plan / ED Course  I have reviewed the triage vital signs and the nursing notes.  Pertinent labs & imaging results that were available during my care of the patient were reviewed by me and considered in my medical decision making (see chart for details).     Taccara Bushnell is a 37 year old female with history of chronic pain, drug-seeking behavior who presents to the ED with left-sided back pain.  Patient denies any trauma.  Has chronic left shoulder pain.  Patient with normal vitals upon arrival.  No fever.  Patient was given IV fentanyl by EMS on the way here.  Patient has tenderness over the left upper back but no  midline spinal tenderness.  Appears consistent with a muscle spasm.  Patient was given Tylenol, naproxen, Valium, lidocaine patch for pain relief.  Patient denies any urinary symptoms, no abdominal pain.  Patient refused x-ray imaging until she receives further pain medicine.  At this time following a dose of narcotic pain medicine and other pain medicine do not believe she warrants any further narcotic medicine at this time.  Had long discussion with this patient.  I would like to obtain x-ray to look for any rib fractures.  Suspect that pain is muscular in nature.  She is not happy with pain plan and refused chest x-ray and eloped from the ED.  Patient walked under her own power out of the ED without any issues.  Did not appeared to be in pain while leaving the hospital.   This chart was dictated using voice recognition software.  Despite best efforts to proofread,  errors can occur which can change the documentation meaning.   Final Clinical Impressions(s) / ED Diagnoses   Final diagnoses:  Acute back pain, unspecified back location, unspecified back pain laterality    ED Discharge Orders    None       Virgina Norfolk, DO 06/13/18 1945

## 2018-06-13 NOTE — ED Triage Notes (Signed)
Patient arrives with c/o left sided flank pain radiating to LUQ and LLQ abdomen. fentanyl given, Patient reports symptoms started after coughing. Hx left sided shoulder injury 1 week ago. -N/V.  Hx hypothyroidism.   BP: 116/81 HR: 120 02: 96%  20g IV left ac

## 2018-06-13 NOTE — ED Notes (Signed)
Pt left without receiving AVS for discharge.

## 2018-06-13 NOTE — ED Notes (Signed)
Patient upset about not receiving pain medication. Patient states, "I've taken tylenol at home, I've done everything, I want this pain to stop." Informed patient of various routes of pain medicine that was tried. Dr. Lockie Mola aware.

## 2018-07-12 ENCOUNTER — Encounter (HOSPITAL_BASED_OUTPATIENT_CLINIC_OR_DEPARTMENT_OTHER): Payer: Self-pay | Admitting: Emergency Medicine

## 2018-07-12 ENCOUNTER — Emergency Department (HOSPITAL_BASED_OUTPATIENT_CLINIC_OR_DEPARTMENT_OTHER)
Admission: EM | Admit: 2018-07-12 | Discharge: 2018-07-12 | Disposition: A | Discharge status: Home / Self Care | Payer: Self-pay | Attending: Emergency Medicine | Admitting: Emergency Medicine

## 2018-07-12 ENCOUNTER — Other Ambulatory Visit: Payer: Self-pay

## 2018-07-12 DIAGNOSIS — Z79899 Other long term (current) drug therapy: Secondary | ICD-10-CM | POA: Insufficient documentation

## 2018-07-12 DIAGNOSIS — Z87891 Personal history of nicotine dependence: Secondary | ICD-10-CM | POA: Insufficient documentation

## 2018-07-12 DIAGNOSIS — E079 Disorder of thyroid, unspecified: Secondary | ICD-10-CM

## 2018-07-12 DIAGNOSIS — Z760 Encounter for issue of repeat prescription: Secondary | ICD-10-CM | POA: Insufficient documentation

## 2018-07-12 MED ORDER — LEVOTHYROXINE SODIUM 175 MCG PO TABS: 175.0000 ug | ORAL_TABLET | Freq: Every day | ORAL | 0 refills | Status: AC

## 2018-07-12 NOTE — ED Provider Notes (Signed)
[facility] EMERGENCY DEPARTMENT Provider Note   CSN: [id number] Arrival date & time: 07/12/18  1724     History   Chief Complaint Chief Complaint  Patient presents with  . Medication Refill    HPI Dana Elliott is a 37 y.o. female.  HPI Patient presents to the emergency department with refills of her medications.  The patient states that she is been attempting to get a new PCP and there is been a lot of mixups with the clinic that she is attempting to work with.  She states that they did give her 30-day supply the medications but she was supposed to get a 90-day supply and she is requesting a few more weeks so she can make it to these appointments.  The patient is advised by this clinic that they are working on getting her an appointment.  Patient has no other symptoms that are significant at this time other than generalized fatigue weight gain.  The patient denies chest pain, shortness of breath, headache,blurred vision, neck pain, fever, cough, weakness, numbness, dizziness, anorexia, edema, abdominal pain, nausea, vomiting, diarrhea, rash, back pain, dysuria, hematemesis, bloody stool, near syncope, or syncope. Past Medical History:  Diagnosis Date  . Abnormal Pap smear   . Allergy   . Anxiety   . Chronic neck pain   . Depression   . Drug-seeking behavior   . Headache   . Interstitial cystitis   . Narcotic abuse (HCC)   . Ovarian cyst   . PID (acute pelvic inflammatory disease)   . Rheumatoid arthritis(714.0)   . Tachycardia   . Thyroid disease     Patient Active Problem List   Diagnosis Date Noted  . Opiate dependence (HCC) 03/16/2014  . Major depression 03/16/2014  . Ache in joint 02/08/2013  . MDD (major depressive disorder) 11/16/2011  . Anxiety 11/16/2011  . Rheumatoid arthritis(714.0) 11/16/2011  . LSIL (low grade squamous intraepithelial lesion) on Pap smear 11/16/2011    Past Surgical History:  Procedure Laterality Date  . ABDOMINAL  HYSTERECTOMY    . LAPAROSCOPIC TUBAL LIGATION    . TUBAL LIGATION       OB History    Gravida  4   Para  2   Term  1   Preterm  1   AB  2   Living  2     SAB      TAB  2   Ectopic      Multiple      Live Births               Home Medications    Prior to Admission medications   Medication Sig Start Date End Date Taking? Authorizing Provider  acetaminophen (TYLENOL 8 HOUR) 650 MG CR tablet Take 1 tablet (650 mg total) by mouth every 8 (eight) hours as needed. 06/05/18   Derwood Kaplan, MD  acetaminophen (TYLENOL) 500 MG tablet Take 1,500 mg by mouth daily as needed for moderate pain.    [provider]  cetirizine (ZYRTEC ALLERGY) 10 MG tablet Take 1 tablet (10 mg total) by mouth daily. Patient not taking: Reported on 04/07/2018 05/05/17   Palumbo, April, MD  Cyclobenzaprine HCl (FLEXERIL PO) Take 1 tablet by mouth daily as needed (pain).    [provider]  erythromycin ophthalmic ointment Place a 1/2 inch ribbon of ointment into the lower eyelid 4 times daily for 5 days Patient not taking: Reported on 06/13/2018 04/12/17   Bethel Born, PA-C  famotidine (PEPCID) 20 MG tablet Take 1 tablet (20 mg total) by mouth 2 (two) times daily. Patient not taking: Reported on 04/07/2018 05/05/17   Palumbo, April, MD  HYDROcodone-acetaminophen (NORCO/VICODIN) 5-325 MG tablet Take 1 tablet by mouth every 6 (six) hours as needed for severe pain. Patient not taking: Reported on 04/07/2018 04/12/17   Bethel Born, PA-C  ibuprofen (ADVIL,MOTRIN) 200 MG tablet Take 200-800 mg by mouth every 6 (six) hours as needed for moderate pain.    [provider]  ibuprofen (ADVIL,MOTRIN) 600 MG tablet Take 1 tablet (600 mg total) by mouth every 6 (six) hours as needed. 06/05/18   Derwood Kaplan, MD  levothyroxine (SYNTHROID, LEVOTHROID) 175 MCG tablet Take 1 tablet (175 mcg total) by mouth daily. For low thyroid function 12/25/14   Lurene Shadow, PA-C  pentosan  polysulfate (ELMIRON) 100 MG capsule Take 100 mg by mouth 3 (three) times daily.    [provider]  PRESCRIPTION MEDICATION Percocet (states is an old prescription)    [provider]    Family History Family History  Problem Relation Age of Onset  . Diabetes Mother   . Hypertension Mother   . Alcohol abuse Paternal Grandmother   . Hypothyroidism Paternal Grandfather   . Other Neg Hx     Social History Social History   Tobacco Use  . Smoking status: Former Smoker    Last attempt to quit: 03/16/2013    Years since quitting: 5.3  . Smokeless tobacco: Never Used  Substance Use Topics  . Alcohol use: No  . Drug use: No     Allergies   Penicillins; Tramadol; Reglan [metoclopramide]; and Toradol [ketorolac tromethamine]   Review of Systems Review of Systems All other systems negative except as documented in the HPI. All pertinent positives and negatives as reviewed in the HPI.  Physical Exam Updated Vital Signs BP (!) 108/95   Pulse 100   Temp 98.1 F (36.7 C) (Oral)   Resp 20   Ht 5\' 6"  (1.676 m)   Wt 90.7 kg   LMP 07/28/2015   SpO2 100%   BMI 32.28 kg/m   Physical Exam  Constitutional: She is oriented to person, place, and time. She appears well-developed and well-nourished. No distress.  HENT:  Head: Normocephalic and atraumatic.  Eyes: Pupils are equal, round, and reactive to light.  Cardiovascular: Normal rate and regular rhythm.  Pulmonary/Chest: Effort normal.  Neurological: She is alert and oriented to person, place, and time.  Skin: Skin is warm and dry.  Psychiatric: She has a normal mood and affect.  Nursing note and vitals reviewed.    ED Treatments / Results  Labs (all labs ordered are listed, but only abnormal results are displayed) Labs Reviewed - No data to display  EKG None  Radiology No results found.  Procedures Procedures (including critical care time)  Medications Ordered in ED Medications - No data to  display   Initial Impression / Assessment and Plan / ED Course  I have reviewed the triage vital signs and the nursing notes.  Pertinent labs & imaging results that were available during my care of the patient were reviewed by me and considered in my medical decision making (see chart for details).     Patient will be given a few weeks worth of her medication I have advised her she will need follow-up as soon as possible.  Patient agrees the plan and all questions were answered. Final Clinical Impressions(s) / ED Diagnoses  Final diagnoses:  None    ED Discharge Orders    None       Charlestine Night, Cordelia Poche 07/12/18 1817    Tilden Fossa, MD 07/13/18 [phone]

## 2018-07-12 NOTE — ED Notes (Signed)
Pt informed that she needs to follow up with PCP for any further refills, denies any other needs at this time

## 2018-07-12 NOTE — Discharge Instructions (Addendum)
Return here as needed.  Follow-up with the clinic that you are attempting to establish with.

## 2018-07-12 NOTE — ED Triage Notes (Signed)
Reports she has not had any thyroid medicine for 2 months.  States that she has gained 25lbs, weak, lethargic.  States that she is supposed to see PCP when they call her with an appt.

## 2018-07-12 NOTE — ED Notes (Signed)
Pt requesting Vicodin and thyroid med refill.

## 2019-03-03 ENCOUNTER — Other Ambulatory Visit: Payer: Self-pay

## 2019-03-03 ENCOUNTER — Emergency Department (HOSPITAL_COMMUNITY): Payer: Self-pay

## 2019-03-03 ENCOUNTER — Emergency Department (HOSPITAL_COMMUNITY)
Admission: EM | Admit: 2019-03-03 | Discharge: 2019-03-03 | Discharge status: Against Medical Advice | Payer: Self-pay | Attending: Emergency Medicine | Admitting: Emergency Medicine

## 2019-03-03 DIAGNOSIS — Z765 Malingerer [conscious simulation]: Secondary | ICD-10-CM

## 2019-03-03 DIAGNOSIS — S61412A Laceration without foreign body of left hand, initial encounter: Secondary | ICD-10-CM

## 2019-03-03 DIAGNOSIS — Y939 Activity, unspecified: Secondary | ICD-10-CM | POA: Insufficient documentation

## 2019-03-03 DIAGNOSIS — Y929 Unspecified place or not applicable: Secondary | ICD-10-CM | POA: Insufficient documentation

## 2019-03-03 DIAGNOSIS — W25XXXA Contact with sharp glass, initial encounter: Secondary | ICD-10-CM | POA: Insufficient documentation

## 2019-03-03 DIAGNOSIS — S61411A Laceration without foreign body of right hand, initial encounter: Secondary | ICD-10-CM | POA: Insufficient documentation

## 2019-03-03 DIAGNOSIS — Z5320 Procedure and treatment not carried out because of patient's decision for unspecified reasons: Secondary | ICD-10-CM | POA: Insufficient documentation

## 2019-03-03 DIAGNOSIS — Y999 Unspecified external cause status: Secondary | ICD-10-CM | POA: Insufficient documentation

## 2019-03-03 MED ORDER — CLINDAMYCIN HCL 150 MG PO CAPS: 450.0000 mg | ORAL_CAPSULE | Freq: Three times a day (TID) | ORAL | 0 refills | Status: AC

## 2019-03-03 MED ORDER — HYDROMORPHONE HCL 1 MG/ML IJ SOLN
1.0000 mg | Freq: Once | INTRAMUSCULAR | Status: AC
  Administered 2019-03-03: 07:00:00 1 mg via INTRAVENOUS
  Filled 2019-03-03: qty 1

## 2019-03-03 MED ORDER — LIDOCAINE-EPINEPHRINE (PF) 2 %-1:200000 IJ SOLN
10.0000 mL | Freq: Once | INTRAMUSCULAR | Status: DC
  Filled 2019-03-03: qty 20

## 2019-03-03 MED ORDER — TETANUS-DIPHTH-ACELL PERTUSSIS 5-2.5-18.5 LF-MCG/0.5 IM SUSP
0.5000 mL | Freq: Once | INTRAMUSCULAR | Status: DC
  Filled 2019-03-03: qty 0.5

## 2019-03-03 MED ORDER — LORAZEPAM 2 MG/ML IJ SOLN
1.0000 mg | Freq: Once | INTRAMUSCULAR | Status: AC
  Administered 2019-03-03: 1 mg via INTRAVENOUS
  Filled 2019-03-03: qty 1

## 2019-03-03 NOTE — ED Notes (Signed)
1 mg ativan wasted in sharps with RN Delos Haring.

## 2019-03-03 NOTE — ED Triage Notes (Signed)
Pt slammed a glass door and it shattered and the shards went into her hand.

## 2019-03-03 NOTE — ED Notes (Signed)
Patient transported to X-ray 

## 2019-03-03 NOTE — Discharge Instructions (Addendum)
Please clean this out at home as best you can.  Apply bacitracin or Neosporin or Vaseline to the area.  Please return to the emergency department for fevers rapid spreading redness.  Wounds are safe to be closed within 12 hours of the injury.  If you would willing to have this close then please see another physician to have it done.  I have prescribed antibiotics for prophylaxis.  I think narcotics are bad for you, you have had a problem with them in the past and not had two prescriptions filled in the past 2 days.  Your injury is not something that typically I would prescribe narcotics for anyway.  Tylenol and ibuprofen usually are sufficient for pain post laceration.

## 2019-03-03 NOTE — ED Notes (Signed)
Waste 1mg  of Ativan with Trinda Pascal, RN

## 2019-03-03 NOTE — ED Provider Notes (Signed)
[facility]ENC[facility] Provider Note   CSN: [id number] Arrival date & time: 03/03/19  [id number]    History   Chief Complaint Chief Complaint  Patient presents with   Laceration    HPI Dana Elliott is a 38 y.o. female.     38 yo F with a chief complaint of left hand pain.  Patient apparently slammed a glass door in anger this morning and her hand went through the door.  Complaining of pain diffusely about the hand.  The history is provided by the patient.  Laceration  Location:  Hand Hand laceration location:  L palm Depth:  Through dermis Quality: straight   Bleeding: controlled   Time since incident:  1 day Foreign body present:  Glass Relieved by:  Nothing Worsened by:  Nothing Ineffective treatments:  None tried Tetanus status:  Unknown Associated symptoms: no fever     Past Medical History:  Diagnosis Date   Abnormal Pap smear    Allergy    Anxiety    Chronic neck pain    Depression    Drug-seeking behavior    Headache    Interstitial cystitis    Narcotic abuse (HCC)    Ovarian cyst    PID (acute pelvic inflammatory disease)    Rheumatoid arthritis(714.0)    Tachycardia    Thyroid disease     Patient Active Problem List   Diagnosis Date Noted   Opiate dependence (HCC) 03/16/2014   Major depression 03/16/2014   Ache in joint 02/08/2013   MDD (major depressive disorder) 11/16/2011   Anxiety 11/16/2011   Rheumatoid arthritis(714.0) 11/16/2011   LSIL (low grade squamous intraepithelial lesion) on Pap smear 11/16/2011    Past Surgical History:  Procedure Laterality Date   ABDOMINAL HYSTERECTOMY     LAPAROSCOPIC TUBAL LIGATION     TUBAL LIGATION       OB History    Gravida  4   Para  2   Term  1   Preterm  1   AB  2   Living  2     SAB      TAB  2   Ectopic      Multiple      Live Births               Home Medications    Prior to Admission medications   Medication  Sig Start Date End Date Taking? Authorizing Provider  acetaminophen (TYLENOL 8 HOUR) 650 MG CR tablet Take 1 tablet (650 mg total) by mouth every 8 (eight) hours as needed. 06/05/18   Derwood KaplanNanavati, Ankit, MD  acetaminophen (TYLENOL) 500 MG tablet Take 1,500 mg by mouth daily as needed for moderate pain.    [provider]  cetirizine (ZYRTEC ALLERGY) 10 MG tablet Take 1 tablet (10 mg total) by mouth daily. Patient not taking: Reported on 04/07/2018 05/05/17   Palumbo, April, MD  clindamycin (CLEOCIN) 150 MG capsule Take 3 capsules (450 mg total) by mouth 3 (three) times daily for 5 days. 03/03/19 03/08/19  Melene PlanFloyd, Yamari Ventola, DO  Cyclobenzaprine HCl (FLEXERIL PO) Take 1 tablet by mouth daily as needed (pain).    [provider]  erythromycin ophthalmic ointment Place a 1/2 inch ribbon of ointment into the lower eyelid 4 times daily for 5 days Patient not taking: Reported on 06/13/2018 04/12/17   Bethel BornGekas, Kelly Marie, PA-C  famotidine (PEPCID) 20 MG tablet Take 1 tablet (20 mg total) by mouth 2 (two) times daily. Patient  not taking: Reported on 04/07/2018 05/05/17   Palumbo, April, MD  HYDROcodone-acetaminophen (NORCO/VICODIN) 5-325 MG tablet Take 1 tablet by mouth every 6 (six) hours as needed for severe pain. Patient not taking: Reported on 04/07/2018 04/12/17   Bethel BornGekas, Kelly Marie, PA-C  ibuprofen (ADVIL,MOTRIN) 200 MG tablet Take 200-800 mg by mouth every 6 (six) hours as needed for moderate pain.    [provider]  ibuprofen (ADVIL,MOTRIN) 600 MG tablet Take 1 tablet (600 mg total) by mouth every 6 (six) hours as needed. 06/05/18   Derwood KaplanNanavati, Ankit, MD  levothyroxine (SYNTHROID, LEVOTHROID) 175 MCG tablet Take 1 tablet (175 mcg total) by mouth daily. For low thyroid function 07/12/18   Lawyer, Cristal Deerhristopher, PA-C  pentosan polysulfate (ELMIRON) 100 MG capsule Take 100 mg by mouth 3 (three) times daily.    [provider]  PRESCRIPTION MEDICATION Percocet (states is an old prescription)     [provider]    Family History Family History  Problem Relation Age of Onset   Diabetes Mother    Hypertension Mother    Alcohol abuse Paternal Grandmother    Hypothyroidism Paternal Grandfather    Other Neg Hx     Social History Social History   Tobacco Use   Smoking status: Former Smoker    Last attempt to quit: 03/16/2013    Years since quitting: 5.9   Smokeless tobacco: Never Used  Substance Use Topics   Alcohol use: No   Drug use: No     Allergies   Penicillins; Tramadol; Reglan [metoclopramide]; and Toradol [ketorolac tromethamine]   Review of Systems Review of Systems  Constitutional: Negative for chills and fever.  HENT: Negative for congestion and rhinorrhea.   Eyes: Negative for redness and visual disturbance.  Respiratory: Negative for shortness of breath and wheezing.   Cardiovascular: Negative for chest pain and palpitations.  Gastrointestinal: Negative for nausea and vomiting.  Genitourinary: Negative for dysuria and urgency.  Musculoskeletal: Negative for arthralgias and myalgias.  Skin: Positive for wound. Negative for pallor.  Neurological: Negative for dizziness and headaches.     Physical Exam Updated Vital Signs BP (!) 139/96    Pulse (!) 136    Temp 100.1 F (37.8 C) (Oral)    Resp (!) 28    Ht 5\' 6"  (1.676 m)    Wt 83.9 kg    LMP 07/28/2015    SpO2 98%    BMI 29.86 kg/m   Physical Exam Vitals signs and nursing note reviewed.  Constitutional:      General: She is not in acute distress.    Appearance: She is well-developed. She is not diaphoretic.  HENT:     Head: Normocephalic and atraumatic.  Eyes:     Pupils: Pupils are equal, round, and reactive to light.  Neck:     Musculoskeletal: Normal range of motion and neck supple.  Cardiovascular:     Rate and Rhythm: Normal rate and regular rhythm.     Heart sounds: No murmur. No friction rub. No gallop.   Pulmonary:     Effort: Pulmonary effort is normal.      Breath sounds: No wheezing or rales.     Comments: hyerventillation Abdominal:     General: There is no distension.     Palpations: Abdomen is soft.     Tenderness: There is no abdominal tenderness.  Musculoskeletal:        General: No tenderness.  Skin:    General: Skin is warm and dry.  Comments: Lac to the thenar eminence. Difficult to exam due to patient compliance.  She appears to have a laceration at the volar aspect of the left second digit.  Appears to have a laceration to the finger pad of the left first digit.  Has a laceration just below the attachment of the second finger to the palm.  Patient is unwilling to have me test functional motion of the hands though she aggressively moves it away and straightens her fingers.  Neurological:     Mental Status: She is alert and oriented to person, place, and time.  Psychiatric:        Behavior: Behavior normal.      ED Treatments / Results  Labs (all labs ordered are listed, but only abnormal results are displayed) Labs Reviewed - No data to display  EKG None  Radiology Dg Hand Complete Left  Result Date: 03/03/2019 CLINICAL DATA:  Injured hand.  Laceration. EXAM: LEFT HAND - COMPLETE 3+ VIEW COMPARISON:  None. FINDINGS: The joint spaces are maintained. No acute fracture is identified. There is a bandage noted over the radial aspect of the wrist and proximal hand with some artifact. Probable underlying laceration. No radiopaque foreign body is identified. IMPRESSION: No acute bony findings or radiopaque foreign body. Electronically Signed   By: Rudie Meyer M.D.   On: 03/03/2019 08:18    Procedures Procedures (including critical care time)  Medications Ordered in ED Medications  Tdap (BOOSTRIX) injection 0.5 mL (has no administration in time range)  lidocaine-EPINEPHrine (XYLOCAINE W/EPI) 2 %-1:200000 (PF) injection 10 mL (has no administration in time range)  LORazepam (ATIVAN) injection 1 mg (1 mg Intravenous Given  03/03/19 0727)  HYDROmorphone (DILAUDID) injection 1 mg (1 mg Intravenous Given 03/03/19 0727)     Initial Impression / Assessment and Plan / ED Course  I have reviewed the triage vital signs and the nursing notes.  Pertinent labs & imaging results that were available during my care of the patient were reviewed by me and considered in my medical decision making (see chart for details).        38 yo F with a cc of a hand laceration.  Difficult exam due to pain.  Hx of narcotic abuse and drug seeking behavior, will obtain plain film.   Plain film is negative for fracture or foreign body.  When I went back into repair the patient's wound she discussed with me that she knows that previously she had a history with narcotics and she thinks that I am trying to hurt her because she told me that I think that she is here for secondary gain.  I told her that I am willing to repair her wounds.  She however is unwilling to hold still which is dangerous for me and any other provider that would need to try and repair this.  I told her that I feel that narcotics are a dangerous thing for her she is had a problem with this in the past and has also had 2 narcotic prescriptions filled in the past 48 hours from 2 separate emergency departments.  I am unsure if this injury was on purpose.  I did offered to close the wounds which she would not allow me to do so stating that she thought I was going to try and hurt her on purpose.  At this point I will discharge her AGAINST MEDICAL ADVICE.  I will prescribe her antibiotics for prophylaxis.  At this time a day she should be  able to see her family doctor in the office and he could close them primarily if she wishes.  I had difficulty ascertaining if she had any ligamentous injury based on limited exam.  I offered for her to return at any time.  8:55 AM:  I have discussed the diagnosis/risks/treatment options with the patient and believe the pt to be eligible for discharge home  to follow-up with PCP. We also discussed returning to the ED immediately if new or worsening sx occur. We discussed the sx which are most concerning (e.g., sudden worsening pain, fever, rapid spreading redness) that necessitate immediate return. Medications administered to the patient during their visit and any new prescriptions provided to the patient are listed below.  Medications given during this visit Medications  Tdap (BOOSTRIX) injection 0.5 mL (has no administration in time range)  lidocaine-EPINEPHrine (XYLOCAINE W/EPI) 2 %-1:200000 (PF) injection 10 mL (has no administration in time range)  LORazepam (ATIVAN) injection 1 mg (1 mg Intravenous Given 03/03/19 0727)  HYDROmorphone (DILAUDID) injection 1 mg (1 mg Intravenous Given 03/03/19 0727)     The patient appears reasonably screen and/or stabilized for discharge and I doubt any other medical condition or other [facility] requiring further screening, evaluation, or treatment in the ED at this time prior to discharge.    Final Clinical Impressions(s) / ED Diagnoses   Final diagnoses:  Laceration of left hand without foreign body, initial encounter    ED Discharge Orders         Ordered    clindamycin (CLEOCIN) 150 MG capsule  3 times daily     03/03/19 0850           Melene PlanFloyd, Lora Chavers, DO 03/03/19 [phone]

## 2019-03-03 NOTE — ED Notes (Signed)
Pt left AMA. Pt states she didn't want the dr. To treat her that she wanted another dr. Pt stormed out of the room once the EMT removed her IV.

## 2019-03-03 NOTE — ED Notes (Signed)
Went in to assist Dr. Adela Lank with laceration due to pt screaming. Dr. Adela Lank was cleaning wound, however pt was very agitated stating we are intentionally trying to hurt her. Staff was simply and properly cleaning wound. Pt became aggressive and wanted to leave. EMT helped pt clean wound in sink and asked to wrap it. Pt did not want help. EMT attempted to have pt stay, pt refused. EMT took IV out and pt walked to the lobby with [employer].

## 2019-03-30 ENCOUNTER — Other Ambulatory Visit: Payer: Self-pay

## 2019-03-30 ENCOUNTER — Encounter (HOSPITAL_BASED_OUTPATIENT_CLINIC_OR_DEPARTMENT_OTHER): Payer: Self-pay | Admitting: Emergency Medicine

## 2019-03-30 ENCOUNTER — Emergency Department (HOSPITAL_BASED_OUTPATIENT_CLINIC_OR_DEPARTMENT_OTHER)
Admission: EM | Admit: 2019-03-30 | Discharge: 2019-03-30 | Disposition: A | Discharge status: Home / Self Care | Payer: Self-pay | Attending: Emergency Medicine | Admitting: Emergency Medicine

## 2019-03-30 DIAGNOSIS — G8918 Other acute postprocedural pain: Secondary | ICD-10-CM | POA: Insufficient documentation

## 2019-03-30 DIAGNOSIS — E039 Hypothyroidism, unspecified: Secondary | ICD-10-CM | POA: Insufficient documentation

## 2019-03-30 DIAGNOSIS — Z79899 Other long term (current) drug therapy: Secondary | ICD-10-CM | POA: Insufficient documentation

## 2019-03-30 DIAGNOSIS — Z87891 Personal history of nicotine dependence: Secondary | ICD-10-CM | POA: Insufficient documentation

## 2019-03-30 MED ORDER — GABAPENTIN 300 MG PO CAPS: ORAL_CAPSULE | ORAL | 0 refills | Status: AC

## 2019-03-30 MED ORDER — OXYCODONE-ACETAMINOPHEN 5-325 MG PO TABS: 1.0000 | ORAL_TABLET | ORAL | 0 refills | Status: AC | PRN

## 2019-03-30 MED ORDER — HYDROMORPHONE HCL 1 MG/ML IJ SOLN
2.0000 mg | Freq: Once | INTRAMUSCULAR | Status: AC
  Administered 2019-03-30: 03:00:00 2 mg via INTRAMUSCULAR
  Filled 2019-03-30: qty 2

## 2019-03-30 MED ORDER — ONDANSETRON 8 MG PO TBDP
8.0000 mg | ORAL_TABLET | Freq: Once | ORAL | Status: AC
  Administered 2019-03-30: 8 mg via ORAL
  Filled 2019-03-30: qty 1

## 2019-03-30 NOTE — ED Provider Notes (Signed)
[place] EMERGENCY DEPARTMENT Provider Note   CSN: [id number] Arrival date & time: 03/30/19  0127   History   Chief Complaint Chief Complaint  Patient presents with  . Hand Pain    HPI Dana Elliott is a 38 y.o. female.   The history is provided by the patient.  Hand Pain  She comes in complaining of pain in her left index finger following surgery on that finger 2 weeks ago.  She describes a sharp, shooting pain as well as a dull ache.  She has been taking hydrocodone and oxycodone with only slight relief.  She noted better relief with oxycodone than hydrocodone.  She has run out of her narcotic prescriptions.  She is scheduled to see her hand surgeon in 5 days.  Of note, she is seeing a different surgeon then who did the procedure because she was not happy with the original surgeon.  Past Medical History:  Diagnosis Date  . Abnormal Pap smear   . Allergy   . Anxiety   . Chronic neck pain   . Depression   . Drug-seeking behavior   . Headache   . Interstitial cystitis   . Narcotic abuse ([place])   . Ovarian cyst   . PID (acute pelvic inflammatory disease)   . Rheumatoid arthritis(714.0)   . Tachycardia   . Thyroid disease     Patient Active Problem List   Diagnosis Date Noted  . Opiate dependence ([place]) 03/16/2014  . Major depression 03/16/2014  . Ache in joint 02/08/2013  . MDD (major depressive disorder) 11/16/2011  . Anxiety 11/16/2011  . Rheumatoid arthritis(714.0) 11/16/2011  . LSIL (low grade squamous intraepithelial lesion) on Pap smear 11/16/2011    Past Surgical History:  Procedure Laterality Date  . ABDOMINAL HYSTERECTOMY    . LAPAROSCOPIC TUBAL LIGATION    . TUBAL LIGATION       OB History    Gravida  4   Para  2   Term  1   Preterm  1   AB  2   Living  2     SAB      TAB  2   Ectopic      Multiple      Live Births               Home Medications    Prior to Admission medications   Medication Sig Start Date  End Date Taking? Authorizing Provider  acetaminophen (TYLENOL 8 HOUR) 650 MG CR tablet Take 1 tablet (650 mg total) by mouth every 8 (eight) hours as needed. 06/05/18   Varney Biles, MD  acetaminophen (TYLENOL) 500 MG tablet Take 1,500 mg by mouth daily as needed for moderate pain.    [provider]  cetirizine (ZYRTEC ALLERGY) 10 MG tablet Take 1 tablet (10 mg total) by mouth daily. Patient not taking: Reported on 04/07/2018 05/05/17   Palumbo, April, MD  Cyclobenzaprine HCl (FLEXERIL PO) Take 1 tablet by mouth daily as needed (pain).    [provider]  erythromycin ophthalmic ointment Place a 1/2 inch ribbon of ointment into the lower eyelid 4 times daily for 5 days Patient not taking: Reported on 06/13/2018 04/12/17   Recardo Evangelist, PA-C  famotidine (PEPCID) 20 MG tablet Take 1 tablet (20 mg total) by mouth 2 (two) times daily. Patient not taking: Reported on 04/07/2018 05/05/17   Palumbo, April, MD  HYDROcodone-acetaminophen (NORCO/VICODIN) 5-325 MG tablet Take 1 tablet by mouth every 6 (six) hours  as needed for severe pain. Patient not taking: Reported on 04/07/2018 04/12/17   Bethel Born, PA-C  ibuprofen (ADVIL,MOTRIN) 200 MG tablet Take 200-800 mg by mouth every 6 (six) hours as needed for moderate pain.    [provider]  ibuprofen (ADVIL,MOTRIN) 600 MG tablet Take 1 tablet (600 mg total) by mouth every 6 (six) hours as needed. 06/05/18   Derwood Kaplan, MD  levothyroxine (SYNTHROID, LEVOTHROID) 175 MCG tablet Take 1 tablet (175 mcg total) by mouth daily. For low thyroid function 07/12/18   Lawyer, Cristal Deer, PA-C  pentosan polysulfate (ELMIRON) 100 MG capsule Take 100 mg by mouth 3 (three) times daily.    [provider]  PRESCRIPTION MEDICATION Percocet (states is an old prescription)    [provider]    Family History Family History  Problem Relation Age of Onset  . Diabetes Mother   . Hypertension Mother   . Alcohol abuse  Paternal Grandmother   . Hypothyroidism Paternal Grandfather   . Other Neg Hx     Social History Social History   Tobacco Use  . Smoking status: Former Smoker    Quit date: 03/16/2013    Years since quitting: 6.0  . Smokeless tobacco: Never Used  Substance Use Topics  . Alcohol use: No  . Drug use: No     Allergies   Penicillins, Tramadol, Reglan [metoclopramide], and Toradol [ketorolac tromethamine]   Review of Systems Review of Systems  All other systems reviewed and are negative.    Physical Exam Updated Vital Signs BP (!) 146/97 (BP Location: Right Arm)   Pulse (!) 137   Temp 98.4 F (36.9 C) (Oral)   Resp 20   Ht 5\' 6"  (1.676 m)   Wt 86.2 kg   LMP 07/28/2015   SpO2 100%   BMI 30.67 kg/m   Physical Exam Vitals signs and nursing note reviewed.    38 year old female, resting comfortably and in no acute distress. Vital signs are significant for elevated blood pressure. Oxygen saturation is 100%, which is normal. Head is normocephalic and atraumatic. PERRLA, EOMI. Oropharynx is clear. Neck is nontender and supple without adenopathy or JVD. Back is nontender and there is no CVA tenderness. Lungs are clear without rales, wheezes, or rhonchi. Chest is nontender. Heart has regular rate and rhythm without murmur. Abdomen is soft, flat, nontender without masses or hepatosplenomegaly and peristalsis is normoactive. Extremities: Mild swelling of the left index finger.  This incision is present and appears to be healing well without signs of infection.  There is no obvious drainage.  There is decreased sensation.. Skin is warm and dry without rash. Neurologic: Mental status is normal, cranial nerves are intact, there are no motor or sensory deficits.  ED Treatments / Results   Procedures Procedures  Medications Ordered in ED Medications  HYDROmorphone (DILAUDID) injection 2 mg (2 mg Intramuscular Given 03/30/19 0241)  ondansetron (ZOFRAN-ODT) disintegrating  tablet 8 mg (8 mg Oral Given 03/30/19 0240)     Initial Impression / Assessment and Plan / ED Course  I have reviewed the triage vital signs and the nursing notes.  Postoperative pain.  Old records are reviewed, and patient has had multiple ED visits since the original injury.  The operative note did state that there was partial laceration of the digital nerves.  I suspect that she is having neuritic pain related to those nerve injuries.  Her record on the [place] controlled substance reporting website was reviewed and she was  getting frequent narcotic prescriptions, but she has not had any prescriptions in the last 8 days and by now she would have run out of the medications prescribed.  I am certainly concerned about her drug-seeking behavior and I discussed this with her, but I have agreed to give her a prescription for 15 oxycodone-acetaminophen tablets and will also start her on gabapentin to see if that gives her some improvement in her nerve pain.  Importance of follow-up with her hand surgeon was stressed.  Final Clinical Impressions(s) / ED Diagnoses   Final diagnoses:  Postoperative pain    ED Discharge Orders         Ordered    oxyCODONE-acetaminophen (PERCOCET) 5-325 MG tablet  Every 4 hours PRN     03/30/19 0244    gabapentin (NEURONTIN) 300 MG capsule     03/30/19 0244           Dione Booze, MD 03/30/19 [phone]

## 2019-03-30 NOTE — Discharge Instructions (Signed)
Make sure you see the hand surgeon as scheduled.

## 2019-03-30 NOTE — ED Triage Notes (Signed)
Pt was seen on 6/5 at [facility] after she put her hand through a glass window in a door  States she had to have emergency surgery   Pt is scheduled to see a specialist for follow up  Pt is here tonight because she is having uncontrolled pain

## 2019-03-30 NOTE — ED Notes (Signed)
ED Provider at bedside. 

## 2019-04-04 ENCOUNTER — Encounter (HOSPITAL_BASED_OUTPATIENT_CLINIC_OR_DEPARTMENT_OTHER): Payer: Self-pay

## 2019-04-04 ENCOUNTER — Other Ambulatory Visit: Payer: Self-pay

## 2019-04-04 ENCOUNTER — Emergency Department (HOSPITAL_BASED_OUTPATIENT_CLINIC_OR_DEPARTMENT_OTHER)
Admission: EM | Admit: 2019-04-04 | Discharge: 2019-04-04 | Disposition: A | Discharge status: Home / Self Care | Payer: Self-pay | Attending: Emergency Medicine | Admitting: Emergency Medicine

## 2019-04-04 DIAGNOSIS — G8928 Other chronic postprocedural pain: Secondary | ICD-10-CM | POA: Insufficient documentation

## 2019-04-04 DIAGNOSIS — M79645 Pain in left finger(s): Secondary | ICD-10-CM | POA: Insufficient documentation

## 2019-04-04 DIAGNOSIS — G8918 Other acute postprocedural pain: Secondary | ICD-10-CM

## 2019-04-04 DIAGNOSIS — Z765 Malingerer [conscious simulation]: Secondary | ICD-10-CM | POA: Insufficient documentation

## 2019-04-04 DIAGNOSIS — Z79899 Other long term (current) drug therapy: Secondary | ICD-10-CM | POA: Insufficient documentation

## 2019-04-04 DIAGNOSIS — Z87891 Personal history of nicotine dependence: Secondary | ICD-10-CM | POA: Insufficient documentation

## 2019-04-04 NOTE — ED Notes (Signed)
Pt requesting more pain medication.

## 2019-04-04 NOTE — ED Notes (Signed)
ED Provider at bedside. 

## 2019-04-04 NOTE — ED Triage Notes (Signed)
Pt wants post surgical left index finger rechecked and requests pain meds-NAD-steady gait

## 2019-04-04 NOTE — ED Notes (Signed)
drg removed by PA

## 2019-04-04 NOTE — ED Notes (Signed)
Pt refused staff to put on drg, states "pain is too severe" pt dressed finger herself with supplies provided

## 2019-04-04 NOTE — ED Provider Notes (Signed)
[facility] EMERGENCY DEPARTMENT Provider Note   CSN: [id number] Arrival date & time: 04/04/19  1640    History   Chief Complaint Chief Complaint  Patient presents with   Follow-up    HPI Dana Elliott is a 38 y.o. female.     Dana Elliott is a 38 y.o. female With a history of recent hand surgery, narcotic abuse and drug-seeking behavior, who presents to the emergency department for evaluation of pain in her left index finger.  She presents requesting wound check and pain medication.  She was seen in the emergency department on 03/30/2019 for the same.  She has been seen times in various emergency departments and urgent cares since her surgery on 7/15, which was performed at [facility] with Dr. Verdene Lennert.  When asked about follow-up patient reports that she is currently in legal proceedings with the surgeon but is scheduled to follow-up with another surgeon.  Patient has stated multiple times in previous notes that she has a follow-up appointment coming up in 5 days, but then appointment seems to get canceled or rescheduled.  She was supposed to have appointment with new surgeon for follow-up yesterday but reports that the office was "overbooked and her appointment has now been rescheduled for Thursday.  She denies any drainage from the sutures.  Reports continued pain that limits her range of motion, no redness, mild swelling that has not progressed to the hand.  She has been keeping the area dressed and clean.  Reports she has been doing everything as directed she takes ibuprofen in addition to pain medication, when she was seen on 7/2 she was prescribed 15 Norco and she was also prescribed gabapentin she reports she has been taking the gabapentin and is increased dose as directed to 300 mg 3 times daily.  She is unsure if this is helped at all because it is all she has taken today, and reports severe pain is returning.  Her surgeon's office has not prescribed her any additional pain  medication.  Review provides additional information.  She has been offered many other nonnarcotic options at other visits but has declined all of these.     Past Medical History:  Diagnosis Date   Abnormal Pap smear    Allergy    Anxiety    Chronic neck pain    Depression    Drug-seeking behavior    Headache    Interstitial cystitis    Narcotic abuse ([place])    Ovarian cyst    PID (acute pelvic inflammatory disease)    Rheumatoid arthritis(714.0)    Tachycardia    Thyroid disease     Patient Active Problem List   Diagnosis Date Noted   Opiate dependence ([place]) 03/16/2014   Major depression 03/16/2014   Ache in joint 02/08/2013   MDD (major depressive disorder) 11/16/2011   Anxiety 11/16/2011   Rheumatoid arthritis(714.0) 11/16/2011   LSIL (low grade squamous intraepithelial lesion) on Pap smear 11/16/2011    Past Surgical History:  Procedure Laterality Date   ABDOMINAL HYSTERECTOMY     LAPAROSCOPIC TUBAL LIGATION     TUBAL LIGATION       OB History    Gravida  4   Para  2   Term  1   Preterm  1   AB  2   Living  2     SAB      TAB  2   Ectopic      Multiple      Live  Births               Home Medications    Prior to Admission medications   Medication Sig Start Date End Date Taking? Authorizing Provider  acetaminophen (TYLENOL 8 HOUR) 650 MG CR tablet Take 1 tablet (650 mg total) by mouth every 8 (eight) hours as needed. 06/05/18   Derwood Kaplan, MD  acetaminophen (TYLENOL) 500 MG tablet Take 1,500 mg by mouth daily as needed for moderate pain.    [provider]  gabapentin (NEURONTIN) 300 MG capsule Take one pill on day 1, take twice a day on day 2, then take three times a day. 03/30/19   Dione Booze, MD  ibuprofen (ADVIL,MOTRIN) 200 MG tablet Take 200-800 mg by mouth every 6 (six) hours as needed for moderate pain.    [provider]  ibuprofen (ADVIL,MOTRIN) 600 MG tablet Take 1 tablet (600 mg  total) by mouth every 6 (six) hours as needed. 06/05/18   Derwood Kaplan, MD  levothyroxine (SYNTHROID, LEVOTHROID) 175 MCG tablet Take 1 tablet (175 mcg total) by mouth daily. For low thyroid function 07/12/18   Lawyer, Cristal Deer, PA-C  oxyCODONE-acetaminophen (PERCOCET) 5-325 MG tablet Take 1 tablet by mouth every 4 (four) hours as needed for moderate pain. 03/30/19   Dione Booze, MD  pentosan polysulfate (ELMIRON) 100 MG capsule Take 100 mg by mouth 3 (three) times daily.    [provider]  PRESCRIPTION MEDICATION Percocet (states is an old prescription)    [provider]  cetirizine (ZYRTEC ALLERGY) 10 MG tablet Take 1 tablet (10 mg total) by mouth daily. Patient not taking: Reported on 04/07/2018 05/05/17 03/30/19  Palumbo, April, MD  famotidine (PEPCID) 20 MG tablet Take 1 tablet (20 mg total) by mouth 2 (two) times daily. Patient not taking: Reported on 04/07/2018 05/05/17 03/30/19  Nicanor Alcon, April, MD    Family History Family History  Problem Relation Age of Onset   Diabetes Mother    Hypertension Mother    Alcohol abuse Paternal Grandmother    Hypothyroidism Paternal Grandfather    Other Neg Hx     Social History Social History   Tobacco Use   Smoking status: Former Smoker    Quit date: 03/16/2013    Years since quitting: 6.0   Smokeless tobacco: Never Used  Substance Use Topics   Alcohol use: No   Drug use: No     Allergies   Penicillins, Tramadol, Reglan [metoclopramide], and Toradol [ketorolac tromethamine]   Review of Systems Review of Systems  Constitutional: Negative for chills and fever.  Musculoskeletal: Positive for arthralgias. Negative for joint swelling.  Skin: Positive for wound.     Physical Exam Updated Vital Signs BP 106/81 (BP Location: Left Arm)    Pulse (!) 118    Temp 98.2 F (36.8 C) (Oral)    Resp 18    Ht 5\' 6"  (1.676 m)    Wt 89.4 kg    LMP 07/28/2015    SpO2 100%    BMI 31.80 kg/m   Physical Exam Vitals signs and  nursing note reviewed.  Constitutional:      General: She is not in acute distress.    Appearance: Normal appearance. She is well-developed and normal weight. She is not ill-appearing or diaphoretic.  HENT:     Head: Normocephalic and atraumatic.  Eyes:     General:        Right eye: No discharge.        Left eye: No  discharge.  Pulmonary:     Effort: Pulmonary effort is normal. No respiratory distress.  Musculoskeletal:     Comments: Left index finger with incision and sutures in place, tissue is clean dry and intact and appears to be healing well without signs of infection, there is slight swelling but no erythema, or drainage.  Skin:    General: Skin is warm and dry.     Capillary Refill: Capillary refill takes less than 2 seconds.  Neurological:     Mental Status: She is alert.     Coordination: Coordination normal.  Psychiatric:        Mood and Affect: Mood normal.        Behavior: Behavior normal.      ED Treatments / Results  Labs (all labs ordered are listed, but only abnormal results are displayed) Labs Reviewed - No data to display  EKG None  Radiology No results found.  Procedures Procedures (including critical care time)  Medications Ordered in ED Medications - No data to display   Initial Impression / Assessment and Plan / ED Course  I have reviewed the triage vital signs and the nursing notes.  Pertinent labs & imaging results that were available during my care of the patient were reviewed by me and considered in my medical decision making (see chart for details).  Patient presents with postoperative pain after left index finger surgery which was [facility] on [facility].  Patient has been seen 6 times at various emergency departments and urgent care since then requesting pain medication, she was last seen on 7/2 and prescribed 15 Norco and gabapentin.  Patient reports she is run out of Norco and now pain is increasing.  She was supposed to have a  follow-up appointment with her surgeon yesterday but reports this got canceled due to "overbooking".  On reviewing documentation from previous visits patient has stated on multiple occasions that appointment has gotten canceled or rescheduled.  On exam today finger is clean dry and intact and does not appear to be infected.  Offered to reapply dressing here today but patient declined, she was provided supplies to redress the finger on her own.  Given patient's numerous visit she is exhibiting drug-seeking behavior, I discussed with patient that due to  narcotic prescribing laws this limits prescribing here in the emergency department and that she will need to follow-up with her surgeon.  I offered her numerous other therapies such as Tylenol, variety of NSAIDs, and she declined these medications.  Patient will be discharged home.  She has follow-up appointment with her surgeon in 2 days.  Final Clinical Impressions(s) / ED Diagnoses   Final diagnoses:  Post-operative pain  Drug-seeking behavior    ED Discharge Orders    None       Dartha LodgeFord, Bisma Klett N, Cordelia Poche-C 04/04/19 Marijean [id number]    Curatolo, Adam, DO 04/04/19 1859

## 2019-04-04 NOTE — Discharge Instructions (Addendum)
Please follow-up with your surgeon as planned, continue taking gabapentin and over-the-counter pain medications which you have stated you have at home.

## 2019-04-10 ENCOUNTER — Encounter (HOSPITAL_COMMUNITY): Payer: Self-pay | Admitting: Family Medicine

## 2019-04-10 ENCOUNTER — Emergency Department (HOSPITAL_COMMUNITY): Payer: Self-pay

## 2019-04-10 ENCOUNTER — Other Ambulatory Visit: Payer: Self-pay

## 2019-04-10 ENCOUNTER — Emergency Department (HOSPITAL_COMMUNITY)
Admission: EM | Admit: 2019-04-10 | Discharge: 2019-04-10 | Disposition: A | Discharge status: Home / Self Care | Payer: Self-pay | Attending: Emergency Medicine | Admitting: Emergency Medicine

## 2019-04-10 DIAGNOSIS — Z765 Malingerer [conscious simulation]: Secondary | ICD-10-CM | POA: Insufficient documentation

## 2019-04-10 DIAGNOSIS — G8918 Other acute postprocedural pain: Secondary | ICD-10-CM | POA: Insufficient documentation

## 2019-04-10 DIAGNOSIS — S66591D Other injury of intrinsic muscle, fascia and tendon of left index finger at wrist and hand level, subsequent encounter: Secondary | ICD-10-CM | POA: Insufficient documentation

## 2019-04-10 DIAGNOSIS — S6992XD Unspecified injury of left wrist, hand and finger(s), subsequent encounter: Secondary | ICD-10-CM

## 2019-04-10 DIAGNOSIS — Z4889 Encounter for other specified surgical aftercare: Secondary | ICD-10-CM | POA: Insufficient documentation

## 2019-04-10 DIAGNOSIS — Z87891 Personal history of nicotine dependence: Secondary | ICD-10-CM | POA: Insufficient documentation

## 2019-04-10 DIAGNOSIS — X58XXXD Exposure to other specified factors, subsequent encounter: Secondary | ICD-10-CM | POA: Insufficient documentation

## 2019-04-10 LAB — CBC WITH DIFFERENTIAL/PLATELET
Abs Immature Granulocytes: 0.02 10*3/uL (ref 0.00–0.07)
Basophils Absolute: 0.1 10*3/uL (ref 0.0–0.1)
Basophils Relative: 1 %
Eosinophils Absolute: 0.2 10*3/uL (ref 0.0–0.5)
Eosinophils Relative: 2 %
HCT: 33.7 % — ABNORMAL LOW (ref 36.0–46.0)
Hemoglobin: 9.8 g/dL — ABNORMAL LOW (ref 12.0–15.0)
Immature Granulocytes: 0 %
Lymphocytes Relative: 24 %
Lymphs Abs: 1.8 10*3/uL (ref 0.7–4.0)
MCH: 20.7 pg — ABNORMAL LOW (ref 26.0–34.0)
MCHC: 29.1 g/dL — ABNORMAL LOW (ref 30.0–36.0)
MCV: 71.2 fL — ABNORMAL LOW (ref 80.0–100.0)
Monocytes Absolute: 0.5 10*3/uL (ref 0.1–1.0)
Monocytes Relative: 6 %
Neutro Abs: 4.9 10*3/uL (ref 1.7–7.7)
Neutrophils Relative %: 67 %
Platelets: 499 10*3/uL — ABNORMAL HIGH (ref 150–400)
RBC: 4.73 MIL/uL (ref 3.87–5.11)
RDW: 17.5 % — ABNORMAL HIGH (ref 11.5–15.5)
WBC: 7.3 10*3/uL (ref 4.0–10.5)
nRBC: 0 % (ref 0.0–0.2)

## 2019-04-10 LAB — BASIC METABOLIC PANEL
Anion gap: 10 (ref 5–15)
BUN: 12 mg/dL (ref 6–20)
CO2: 22 mmol/L (ref 22–32)
Calcium: 8.9 mg/dL (ref 8.9–10.3)
Chloride: 105 mmol/L (ref 98–111)
Creatinine, Ser: 0.74 mg/dL (ref 0.44–1.00)
GFR calc Af Amer: >60 mL/min (ref >60)
GFR calc non Af Amer: >60 mL/min (ref >60)
Glucose, Bld: 92 mg/dL (ref 70–99)
Potassium: 3.9 mmol/L (ref 3.5–5.1)
Sodium: 137 mmol/L (ref 135–145)

## 2019-04-10 LAB — LACTIC ACID, PLASMA: Lactic Acid, Venous: 0.8 mmol/L (ref 0.5–1.9)

## 2019-04-10 MED ORDER — OXYCODONE-ACETAMINOPHEN 5-325 MG PO TABS
1.0000 | ORAL_TABLET | Freq: Once | ORAL | Status: AC
  Administered 2019-04-10: 1 via ORAL
  Filled 2019-04-10: qty 1

## 2019-04-10 MED ORDER — DOXYCYCLINE HYCLATE 100 MG PO CAPS: 100.0000 mg | ORAL_CAPSULE | Freq: Two times a day (BID) | ORAL | 0 refills | Status: AC

## 2019-04-10 NOTE — ED Notes (Signed)
Patient left prior to receiving discharge instructions. Patients gown was found on the stretcher. Informed M. Alroy Bailiff, [place] of this information.

## 2019-04-10 NOTE — ED Notes (Addendum)
Patient in radiology. Instructed by provider, M. Alroy Bailiff, [place], to not give pain medication until instructed to do so

## 2019-04-10 NOTE — ED Provider Notes (Signed)
[facility] DEPT Provider Note   CSN: [id number] Arrival date & time: 04/10/19  1827    History   Chief Complaint Chief Complaint  Patient presents with   Finger Injury    HPI Byrd Terrero is a 38 y.o. female who presents to the ED today for left index finger pain s/p tendon repair done on 06/15 by Dr. Verdene Lennert. Pt has been seen multiple times at separate locations throughout [place], [place], [place] (see below). She is currently in the process of taking legal action against Dr. Verdene Lennert. She reports she has been unable to see another hand surgeon due to insurance issues and inability to pay out of pocket. Most recent visit at [facility] 07/07; she reported at that time she had an appointment with a hand surgeon; states that they wanted her to pay $250 out of pocket and she could not afford it. Patient currently complains of continued pain; she has been given multiple prescriptions for narcotics (PMDP reviewed/see below). Pt also has sutures in place currently. Denies fever, chills, drainage to the wound.    Per chart review: 06/15 [facility] - Left hand tendon repair; prescribed #20 Norco 06/18 [facility] - Patient evaluated; no narcotics given; note reports that patient had been seen that day by Dr. Verdene Lennert who also told patient she would not be getting more narcotic pain medication 6/20 [facility] ED - Patient evaluated and eloped 6/20 Urgent Care- Patient evaluated; told she would not be getting any narcotic pain medication. Called EMS while in the office for transport to the ED 6/20 [facility] - Patient evaluated; prescribed #20 Percocet 6/24 [facility] - Patient evaluated; prescribed Clindamycin 07/02 [facility] ED - Patient evaluated; prescribed #15 Percocet and Gabapentin 07/07 [facility] ED - Patient evaluated; no pain medication. Offered  additional therapies including Tylenol and NSAIDS which were declined   PMDP: 06/24 #10 Percocet prescribed by Cline Cools, NP with [facility]. Do not see note in the system for this visit      The history is provided by the patient and medical records.    Past Medical History:  Diagnosis Date   Abnormal Pap smear    Allergy    Anxiety    Chronic neck pain    Depression    Drug-seeking behavior    Headache    Interstitial cystitis    Narcotic abuse ([place])    Ovarian cyst    PID (acute pelvic inflammatory disease)    Rheumatoid arthritis(714.0)    Tachycardia    Thyroid disease     Patient Active Problem List   Diagnosis Date Noted   Opiate dependence ([place]) 03/16/2014   Major depression 03/16/2014   Ache in joint 02/08/2013   MDD (major depressive disorder) 11/16/2011   Anxiety 11/16/2011   Rheumatoid arthritis(714.0) 11/16/2011   LSIL (low grade squamous intraepithelial lesion) on Pap smear 11/16/2011    Past Surgical History:  Procedure Laterality Date   ABDOMINAL HYSTERECTOMY     LAPAROSCOPIC TUBAL LIGATION     TUBAL LIGATION       OB History    Gravida  4   Para  2   Term  1   Preterm  1   AB  2   Living  2     SAB      TAB  2   Ectopic      Multiple  Live Births               Home Medications    Prior to Admission medications   Medication Sig Start Date End Date Taking? Authorizing Provider  ibuprofen (ADVIL,MOTRIN) 200 MG tablet Take 200-800 mg by mouth every 6 (six) hours as needed for moderate pain.   Yes [provider]  acetaminophen (TYLENOL 8 HOUR) 650 MG CR tablet Take 1 tablet (650 mg total) by mouth every 8 (eight) hours as needed. Patient not taking: Reported on 04/10/2019 06/05/18   Derwood Kaplan, MD  doxycycline (VIBRAMYCIN) 100 MG capsule Take 1 capsule (100 mg total) by mouth 2 (two) times daily for 7 days. 04/10/19 04/17/19  Tanda Rockers, PA-C  gabapentin  (NEURONTIN) 300 MG capsule Take one pill on day 1, take twice a day on day 2, then take three times a day. Patient not taking: Reported on 04/10/2019 03/30/19   Dione Booze, MD  ibuprofen (ADVIL,MOTRIN) 600 MG tablet Take 1 tablet (600 mg total) by mouth every 6 (six) hours as needed. Patient not taking: Reported on 04/10/2019 06/05/18   Derwood Kaplan, MD  levothyroxine (SYNTHROID, LEVOTHROID) 175 MCG tablet Take 1 tablet (175 mcg total) by mouth daily. For low thyroid function Patient not taking: Reported on 04/10/2019 07/12/18   Charlestine Night, PA-C  oxyCODONE-acetaminophen (PERCOCET) 5-325 MG tablet Take 1 tablet by mouth every 4 (four) hours as needed for moderate pain. Patient not taking: Reported on 04/10/2019 03/30/19   Dione Booze, MD  cetirizine (ZYRTEC ALLERGY) 10 MG tablet Take 1 tablet (10 mg total) by mouth daily. Patient not taking: Reported on 04/07/2018 05/05/17 03/30/19  Palumbo, April, MD  famotidine (PEPCID) 20 MG tablet Take 1 tablet (20 mg total) by mouth 2 (two) times daily. Patient not taking: Reported on 04/07/2018 05/05/17 03/30/19  Nicanor Alcon, April, MD    Family History Family History  Problem Relation Age of Onset   Diabetes Mother    Hypertension Mother    Alcohol abuse Paternal Grandmother    Hypothyroidism Paternal Grandfather    Other Neg Hx     Social History Social History   Tobacco Use   Smoking status: Former Smoker    Quit date: 03/16/2013    Years since quitting: 6.0   Smokeless tobacco: Never Used  Substance Use Topics   Alcohol use: No   Drug use: No     Allergies   Penicillins, Tramadol, Reglan [metoclopramide], and Toradol [ketorolac tromethamine]   Review of Systems Review of Systems  Constitutional: Negative for chills and fever.  Musculoskeletal: Positive for arthralgias.  Skin: Positive for wound.     Physical Exam Updated Vital Signs BP (!) 127/93 (BP Location: Right Arm)    Pulse (!) 120    Temp 99.1 F (37.3 C) (Oral)     Resp 15    Ht 5\' 6"  (1.676 m)    Wt 86.2 kg    LMP 07/28/2015    SpO2 100%    BMI 30.67 kg/m   Physical Exam Vitals signs and nursing note reviewed.  Constitutional:      Appearance: She is not ill-appearing.  HENT:     Head: Normocephalic and atraumatic.  Eyes:     Conjunctiva/sclera: Conjunctivae normal.  Cardiovascular:     Rate and Rhythm: Normal rate and regular rhythm.  Pulmonary:     Effort: Pulmonary effort is normal.     Breath sounds: Normal breath sounds.  Musculoskeletal:     Comments: Healed incision with  sutures in place to left index finger. Wound appears clean without signs of infection. Mild swelling noted to finger. ROM limited due to pain. Cap refill < 2 seconds. Sensation intact. 2+ radial pulse.   Skin:    General: Skin is warm and dry.     Coloration: Skin is not jaundiced.  Neurological:     Mental Status: She is alert.      ED Treatments / Results  Labs (all labs ordered are listed, but only abnormal results are displayed) Labs Reviewed  CBC WITH DIFFERENTIAL/PLATELET - Abnormal; Notable for the following components:      Result Value   Hemoglobin 9.8 (*)    HCT 33.7 (*)    MCV 71.2 (*)    MCH 20.7 (*)    MCHC 29.1 (*)    RDW 17.5 (*)    Platelets 499 (*)    All other components within normal limits  BASIC METABOLIC PANEL  LACTIC ACID, PLASMA  LACTIC ACID, PLASMA    EKG None  Radiology Dg Finger Index Left  Result Date: 04/10/2019 CLINICAL DATA:  Recent surgery on second digit several weeks ago with underlying soft tissue infection EXAM: LEFT INDEX FINGER 2+V COMPARISON:  None. FINDINGS: No acute fracture or dislocation is noted. No bony erosion is seen. No radiopaque foreign body is noted. IMPRESSION: No acute abnormality seen. Electronically Signed   By: Alcide CleverMark  Lukens M.D.   On: 04/10/2019 20:45    Procedures Procedures (including critical care time)  Medications Ordered in ED Medications  oxyCODONE-acetaminophen (PERCOCET/ROXICET)  5-325 MG per tablet 1 tablet (1 tablet Oral Given 04/10/19 2054)     Initial Impression / Assessment and Plan / ED Course  I have reviewed the triage vital signs and the nursing notes.  Pertinent labs & imaging results that were available during my care of the patient were reviewed by me and considered in my medical decision making (see chart for details).  38 year old female who presents to the ED for further eval of left index finger s/p tendon repair done on 06/15. Pt has been seen by multiple EDs and urgent cares throughout the [place] area (see HPI above); she has been given 4 different prescriptions of narcotic pain medication since surgery. Most recent on 07/02 #15 Percocet. Pt exhibits drug seeking behavior and many providers that she has seen are concerned for the same. PMDP reviewed; even prior to surgery patient was getting multiple prescriptions from different cities for pain meds. She is currently in the process of taking legal action against her hand surgeon Dr. Orland MustardMcMurtry. Pt reports increasing pain to the area; the sutures are still in place. The wound does not appear infected today. Pt has a temp of 99.1 and is tachycardic on exam. Concern for possible infection - will get baseline bloodwork as well as x ray today to rule out osteo. Discussed case with attending physician Dr. Rush Landmarkegeler who agrees with plan; suggests giving 1 dose of pain medication in the ED. Will get this once patient has been cooperative with x rays and bloodwork. Will reevaluate once labs and imaging return.   X ray without signs of osteo. Labwork reassuring today as well. Lactic acid negative. No leukocytosis to suggest sepsis. After pain medication patient's vitals stabilized. Repeat temp 98.1 and pulse rate down to 89. Discussed findings with patient. She is again requesting "something for pain until I can see someone." Do not feel comfortable sending home with narcotic pain medication today. Will cover empirically for  abx  given sutures still in place and do not feel like patient will seek appropriate follow up. Would feel more comfortable for hand surgeon to remove these sutures and evaluate finger more thoroughly. Strongly encouraged patient to find a way to follow up with a hand specialist. She may need to pay out of pocket given issues with insurance. Concern that patient will continue coming to the ED and other EDs in the area instead of following up with appropriate individual. Patient discharged at this time. Resource given for [facility] for primary care needs.   10:02 PM Nursing staff informed that patient left prior to being given discharge paperwork. She was made aware that antibiotics would be at her preferred pharmacy prior to eloping.    Clinical Course as of Apr 09 2153  Mon Apr 10, 2019  2108 At baseline   Hemoglobin(!): 9.8 [MV]  2108 No concern for osteo  DG Finger Index Left [MV]    Clinical Course User Index [MV] Tanda Rockers, PA-C         Final Clinical Impressions(s) / ED Diagnoses   Final diagnoses:  Injury of finger of left hand, subsequent encounter  Postoperative pain  Encounter for post surgical wound check  Drug-seeking behavior    ED Discharge Orders         Ordered    doxycycline (VIBRAMYCIN) 100 MG capsule  2 times daily     04/10/19 2152           Tanda Rockers, PA-C 04/11/19 0009    Tegeler, Canary Brim, MD 04/11/19 0022

## 2019-04-10 NOTE — Discharge Instructions (Addendum)
Please follow up with a hand surgeon as soon as possible to have your hand looked at.  Your wound does not appear infected today and your x ray was negative for an infection into the bone.  Your bloodwork was reassuring and did not show any signs of infection into your bloodstream.  I have given you a course of antibiotics to take as you still have your sutures in place. It is important to get these removed ASAP.

## 2019-04-10 NOTE — ED Triage Notes (Signed)
Patient is concerned about her left index finger where she recently had surgery on the finger at [facility] with Dr. Verdene Lennert. Since the surgery, patient has been seen at [facility] on 07/06, 07/07, [facility] ED on 03/22/19, 03/18/19,  [facility] 03/18/19, and [facility] 03/16/19 for the pain.

## 2019-09-07 ENCOUNTER — Emergency Department (HOSPITAL_BASED_OUTPATIENT_CLINIC_OR_DEPARTMENT_OTHER)
Admission: EM | Admit: 2019-09-07 | Discharge: 2019-09-07 | Disposition: A | Discharge status: Home / Self Care | Payer: Medicaid Other | Attending: Emergency Medicine | Admitting: Emergency Medicine

## 2019-09-07 ENCOUNTER — Other Ambulatory Visit: Payer: Self-pay

## 2019-09-07 ENCOUNTER — Emergency Department (HOSPITAL_BASED_OUTPATIENT_CLINIC_OR_DEPARTMENT_OTHER)
Admission: EM | Admit: 2019-09-07 | Discharge: 2019-09-08 | Disposition: A | Discharge status: Home / Self Care | Payer: Self-pay | Attending: Emergency Medicine | Admitting: Emergency Medicine

## 2019-09-07 ENCOUNTER — Encounter (HOSPITAL_BASED_OUTPATIENT_CLINIC_OR_DEPARTMENT_OTHER): Payer: Self-pay | Admitting: Emergency Medicine

## 2019-09-07 DIAGNOSIS — Z9889 Other specified postprocedural states: Secondary | ICD-10-CM | POA: Insufficient documentation

## 2019-09-07 DIAGNOSIS — M79642 Pain in left hand: Secondary | ICD-10-CM | POA: Insufficient documentation

## 2019-09-07 DIAGNOSIS — Z5321 Procedure and treatment not carried out due to patient leaving prior to being seen by health care provider: Secondary | ICD-10-CM | POA: Insufficient documentation

## 2019-09-07 DIAGNOSIS — Z79899 Other long term (current) drug therapy: Secondary | ICD-10-CM | POA: Insufficient documentation

## 2019-09-07 DIAGNOSIS — Z765 Malingerer [conscious simulation]: Secondary | ICD-10-CM

## 2019-09-07 DIAGNOSIS — Z87891 Personal history of nicotine dependence: Secondary | ICD-10-CM | POA: Insufficient documentation

## 2019-09-07 NOTE — ED Triage Notes (Signed)
Pt is c/o left hand pain  Pt states she had surgery on her hand several months ago and has had problems since  States for the past 3 days it feels like a bus ran over her hand

## 2019-09-07 NOTE — ED Notes (Signed)
Pt has returned to ED. 

## 2019-09-07 NOTE — ED Notes (Signed)
Patient observed leaving emergency department before being seen by provider. Charge informed.

## 2019-09-08 ENCOUNTER — Encounter (HOSPITAL_BASED_OUTPATIENT_CLINIC_OR_DEPARTMENT_OTHER): Payer: Self-pay | Admitting: Emergency Medicine

## 2019-09-08 ENCOUNTER — Emergency Department (HOSPITAL_BASED_OUTPATIENT_CLINIC_OR_DEPARTMENT_OTHER): Payer: Self-pay

## 2019-09-08 MED ORDER — LIDOCAINE HCL URETHRAL/MUCOSAL 2 % EX GEL
1.0000 "application " | Freq: Once | CUTANEOUS | Status: DC
  Filled 2019-09-08: qty 20

## 2019-09-08 MED ORDER — PREDNISONE 50 MG PO TABS
60.0000 mg | ORAL_TABLET | Freq: Once | ORAL | Status: DC
  Filled 2019-09-08: qty 1

## 2019-09-08 MED ORDER — GABAPENTIN 100 MG PO CAPS
100.0000 mg | ORAL_CAPSULE | ORAL | Status: DC
  Filled 2019-09-08: qty 1

## 2019-09-08 MED ORDER — LIDOCAINE HCL 2 % EX GEL: 1.0000 "application " | Freq: Two times a day (BID) | CUTANEOUS | 0 refills | Status: AC

## 2019-09-08 NOTE — ED Notes (Signed)
Patient became verbally belligerent when informed of medications to be administered. Patient demanded "pain medications" then decided to leave this Emergency Department. Provider informed. Charge informed. Patient refused discharge paperwork.
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
## 2019-11-15 ENCOUNTER — Emergency Department (HOSPITAL_COMMUNITY): Payer: Self-pay

## 2019-11-15 ENCOUNTER — Other Ambulatory Visit: Payer: Self-pay

## 2019-11-15 ENCOUNTER — Emergency Department (HOSPITAL_COMMUNITY)
Admission: EM | Admit: 2019-11-15 | Discharge: 2019-11-15 | Disposition: A | Discharge status: Home / Self Care | Payer: Self-pay | Attending: Emergency Medicine | Admitting: Emergency Medicine

## 2019-11-15 DIAGNOSIS — Z87891 Personal history of nicotine dependence: Secondary | ICD-10-CM | POA: Insufficient documentation

## 2019-11-15 DIAGNOSIS — M545 Low back pain, unspecified: Secondary | ICD-10-CM

## 2019-11-15 DIAGNOSIS — Z79899 Other long term (current) drug therapy: Secondary | ICD-10-CM | POA: Insufficient documentation

## 2019-11-15 LAB — COMPREHENSIVE METABOLIC PANEL
ALT: 13 U/L (ref 0–44)
AST: 15 U/L (ref 15–41)
Albumin: 4.1 g/dL (ref 3.5–5.0)
Alkaline Phosphatase: 66 U/L (ref 38–126)
Anion gap: 8 (ref 5–15)
BUN: 13 mg/dL (ref 6–20)
CO2: 22 mmol/L (ref 22–32)
Calcium: 9.1 mg/dL (ref 8.9–10.3)
Chloride: 107 mmol/L (ref 98–111)
Creatinine, Ser: 0.99 mg/dL (ref 0.44–1.00)
GFR calc Af Amer: >60 mL/min (ref >60)
GFR calc non Af Amer: >60 mL/min (ref >60)
Glucose, Bld: 96 mg/dL (ref 70–99)
Potassium: 3.9 mmol/L (ref 3.5–5.1)
Sodium: 137 mmol/L (ref 135–145)
Total Bilirubin: 0.5 mg/dL (ref 0.3–1.2)
Total Protein: 8.1 g/dL (ref 6.5–8.1)

## 2019-11-15 LAB — CBC WITH DIFFERENTIAL/PLATELET
Abs Immature Granulocytes: 0.04 10*3/uL (ref 0.00–0.07)
Basophils Absolute: 0.1 10*3/uL (ref 0.0–0.1)
Basophils Relative: 1 %
Eosinophils Absolute: 0.2 10*3/uL (ref 0.0–0.5)
Eosinophils Relative: 2 %
HCT: 35.5 % — ABNORMAL LOW (ref 36.0–46.0)
Hemoglobin: 9.8 g/dL — ABNORMAL LOW (ref 12.0–15.0)
Immature Granulocytes: 0 %
Lymphocytes Relative: 25 %
Lymphs Abs: 2.5 10*3/uL (ref 0.7–4.0)
MCH: 18.8 pg — ABNORMAL LOW (ref 26.0–34.0)
MCHC: 27.6 g/dL — ABNORMAL LOW (ref 30.0–36.0)
MCV: 68.3 fL — ABNORMAL LOW (ref 80.0–100.0)
Monocytes Absolute: 0.6 10*3/uL (ref 0.1–1.0)
Monocytes Relative: 6 %
Neutro Abs: 6.5 10*3/uL (ref 1.7–7.7)
Neutrophils Relative %: 66 %
Platelets: 515 10*3/uL — ABNORMAL HIGH (ref 150–400)
RBC: 5.2 MIL/uL — ABNORMAL HIGH (ref 3.87–5.11)
RDW: 18.4 % — ABNORMAL HIGH (ref 11.5–15.5)
WBC: 9.8 10*3/uL (ref 4.0–10.5)
nRBC: 0 % (ref 0.0–0.2)

## 2019-11-15 LAB — I-STAT BETA HCG BLOOD, ED (MC, WL, AP ONLY): I-stat hCG, quantitative: <5 m[IU]/mL (ref <5)

## 2019-11-15 MED ORDER — ONDANSETRON HCL 4 MG/2ML IJ SOLN
4.0000 mg | Freq: Once | INTRAMUSCULAR | Status: DC
  Filled 2019-11-15: qty 2

## 2019-11-15 MED ORDER — FENTANYL CITRATE (PF) 100 MCG/2ML IJ SOLN
50.0000 ug | Freq: Once | INTRAMUSCULAR | Status: DC
  Filled 2019-11-15: qty 2

## 2019-11-15 MED ORDER — DROPERIDOL 2.5 MG/ML IJ SOLN
1.2500 mg | Freq: Once | INTRAMUSCULAR | Status: AC
  Administered 2019-11-15: 04:00:00 1.25 mg via INTRAVENOUS
  Filled 2019-11-15: qty 2

## 2019-11-15 MED ORDER — DIPHENHYDRAMINE HCL 50 MG/ML IJ SOLN
25.0000 mg | Freq: Once | INTRAMUSCULAR | Status: AC
  Administered 2019-11-15: 04:00:00 25 mg via INTRAVENOUS
  Filled 2019-11-15: qty 1

## 2019-11-15 NOTE — ED Notes (Signed)
Patient requests nurse and states she is going home. She states she has been here for hours and received no pain medications. Pt educated that she she has been in the facility for just over an hour and we are still pending results. Nurse x 2 emphasized how important urinalysis was so we knew how to treat her. Patient becomes angry and removes IV by self and elopes prior to speaking with provider. Provider aware.

## 2019-11-15 NOTE — Discharge Instructions (Addendum)
SEEK IMMEDIATE MEDICAL ATTENTION IF: New numbness, tingling, weakness, or problem with the use of your arms or legs.  Severe back pain not relieved with medications.  Change in bowel or bladder control.  Increasing pain in any areas of the body (such as chest or abdominal pain).  Shortness of breath, dizziness or fainting.  Nausea (feeling sick to your stomach), vomiting, fever, or sweats.  

## 2019-11-15 NOTE — ED Triage Notes (Signed)
BIB EMS from home. Left flank pain starting 2 weeks ago with dull pain worsen over the time. Tylenol and heating pads not helping. Pain not un bearable and radiates up to shoulder. Pt denies pain radiating to abdomen. Tender to palpation.   156/86 100 98

## 2019-11-15 NOTE — ED Provider Notes (Signed)
[facility] DEPT Provider Note   CSN: [id number] Arrival date & time: 11/15/19  3790     History Chief Complaint  Patient presents with  . Flank Pain    Left radiates up to shoulder    Dana Elliott is a 39 y.o. female who arrives via EMS for back pain. She has a PMH of narcotic abuse, chronic pain, chronic interstitial cystitis, and drug seeking behvaior well documented on review of EMR. She has 182 ER visits and 263 gyn visits, mostly for pain related complaints since 2012.   History is gathered from the patient, EMR and EMS at bedside.  EMS reports that the patient has had 2 weeks of back pain but has not sought any help for.  The patient states that over the past 2 days she has had severe lumbar pain.  She states "I cannot eat I cannot sleep and cannot move I cannot do anything because his pain is so severe."  She states "I never have any back pain issues."  She states that symptoms began mildly 2 weeks ago.  She states it radiates "up my entire back."  She states that any movement or palpation makes it worse.  She claims that EMS "touched my back and even a littlest touch me me scream." She denies radiation down the lower extremities, weakness, saddle anesthesia, loss of bowel or bladder continence.  She denies any IV drug use, recent procedures to her back, fever, chills, urinary symptoms, abdominal pain, trauma or other injuries to the lumbar spine.  HPI     Past Medical History:  Diagnosis Date  . Abnormal Pap smear   . Allergy   . Anxiety   . Chronic neck pain   . Depression   . Drug-seeking behavior   . Headache   . Interstitial cystitis   . Narcotic abuse ([place])   . Ovarian cyst   . PID (acute pelvic inflammatory disease)   . Rheumatoid arthritis(714.0)   . Tachycardia   . Thyroid disease     Patient Active Problem List   Diagnosis Date Noted  . Opiate dependence ([place]) 03/16/2014  . Major depression 03/16/2014  . Ache in joint  02/08/2013  . MDD (major depressive disorder) 11/16/2011  . Anxiety 11/16/2011  . Rheumatoid arthritis(714.0) 11/16/2011  . LSIL (low grade squamous intraepithelial lesion) on Pap smear 11/16/2011    Past Surgical History:  Procedure Laterality Date  . ABDOMINAL HYSTERECTOMY    . HAND SURGERY    . LAPAROSCOPIC TUBAL LIGATION    . TUBAL LIGATION       OB History    Gravida  4   Para  2   Term  1   Preterm  1   AB  2   Living  2     SAB      TAB  2   Ectopic      Multiple      Live Births              Family History  Problem Relation Age of Onset  . Diabetes Mother   . Hypertension Mother   . Alcohol abuse Paternal Grandmother   . Hypothyroidism Paternal Grandfather   . Other Neg Hx     Social History   Tobacco Use  . Smoking status: Former Smoker    Quit date: 03/16/2013    Years since quitting: 6.6  . Smokeless tobacco: Never Used  Substance Use Topics  . Alcohol use: No  .  Drug use: No    Home Medications Prior to Admission medications   Medication Sig Start Date End Date Taking? Authorizing Provider  acetaminophen (TYLENOL 8 HOUR) 650 MG CR tablet Take 1 tablet (650 mg total) by mouth every 8 (eight) hours as needed. Patient not taking: Reported on 04/10/2019 06/05/18   Derwood Kaplan, MD  gabapentin (NEURONTIN) 300 MG capsule Take one pill on day 1, take twice a day on day 2, then take three times a day. Patient not taking: Reported on 04/10/2019 03/30/19   Dione Booze, MD  ibuprofen (ADVIL,MOTRIN) 200 MG tablet Take 200-800 mg by mouth every 6 (six) hours as needed for moderate pain.    [provider]  ibuprofen (ADVIL,MOTRIN) 600 MG tablet Take 1 tablet (600 mg total) by mouth every 6 (six) hours as needed. Patient not taking: Reported on 04/10/2019 06/05/18   Derwood Kaplan, MD  levothyroxine (SYNTHROID, LEVOTHROID) 175 MCG tablet Take 1 tablet (175 mcg total) by mouth daily. For low thyroid function Patient not taking: Reported on  04/10/2019 07/12/18   Charlestine Night, PA-C  lidocaine (XYLOCAINE) 2 % jelly Apply 1 application topically 2 (two) times daily. 09/08/19   Palumbo, April, MD  oxyCODONE-acetaminophen (PERCOCET) 5-325 MG tablet Take 1 tablet by mouth every 4 (four) hours as needed for moderate pain. Patient not taking: Reported on 04/10/2019 03/30/19   Dione Booze, MD  cetirizine (ZYRTEC ALLERGY) 10 MG tablet Take 1 tablet (10 mg total) by mouth daily. Patient not taking: Reported on 04/07/2018 05/05/17 03/30/19  Palumbo, April, MD  famotidine (PEPCID) 20 MG tablet Take 1 tablet (20 mg total) by mouth 2 (two) times daily. Patient not taking: Reported on 04/07/2018 05/05/17 03/30/19  Palumbo, April, MD    Allergies    Penicillins, Tramadol, Reglan [metoclopramide], and Toradol [ketorolac tromethamine]  Review of Systems   Review of Systems Ten systems reviewed and are negative for acute change, except as noted in the HPI.   Physical Exam Updated Vital Signs BP 131/89   Pulse 94   Temp 99.4 F (37.4 C) (Oral)   Resp 20   Ht 5\' 6"  (1.676 m)   Wt 86.2 kg   LMP 07/28/2015   SpO2 100%   BMI 30.67 kg/m   Physical Exam Vitals and nursing note reviewed.  Constitutional:      General: She is not in acute distress.    Appearance: She is well-developed. She is not diaphoretic.  HENT:     Head: Normocephalic and atraumatic.  Eyes:     General: No scleral icterus.    Conjunctiva/sclera: Conjunctivae normal.  Cardiovascular:     Rate and Rhythm: Normal rate and regular rhythm.     Heart sounds: Normal heart sounds. No murmur. No friction rub. No gallop.   Pulmonary:     Effort: Pulmonary effort is normal. No respiratory distress.     Breath sounds: Normal breath sounds.  Abdominal:     General: Bowel sounds are normal. There is no distension.     Palpations: Abdomen is soft. There is no mass.     Tenderness: There is no abdominal tenderness. There is no guarding.  Musculoskeletal:     Cervical back: Normal  range of motion.     Comments: Patient able to self transfer from EMS gurney to ER bed. Midline tenderness out of proportion to exam. Patient screams at slightest touch of lumbar region. No CVA tenderness, No abdominal tenderness. Normal strength and sensation of the lower extremities. Patellar reflexes  2 + No myoclonus  Skin:    General: Skin is warm and dry.  Neurological:     Mental Status: She is alert and oriented to person, place, and time.  Psychiatric:        Mood and Affect: Affect is tearful.        Behavior: Behavior normal.     Comments: Behavior is histrionic     ED Results / Procedures / Treatments   Labs (all labs ordered are listed, but only abnormal results are displayed) Labs Reviewed  CBC WITH DIFFERENTIAL/PLATELET - Abnormal; Notable for the following components:      Result Value   RBC 5.20 (*)    Hemoglobin 9.8 (*)    HCT 35.5 (*)    MCV 68.3 (*)    MCH 18.8 (*)    MCHC 27.6 (*)    RDW 18.4 (*)    Platelets 515 (*)    All other components within normal limits  COMPREHENSIVE METABOLIC PANEL  URINALYSIS, ROUTINE W REFLEX MICROSCOPIC  I-STAT BETA HCG BLOOD, ED ([facility], [facility], [facility] ONLY)    EKG None  Radiology DG Lumbar Spine Complete  Result Date: 11/15/2019 CLINICAL DATA:  Atraumatic low back pain for 2 weeks EXAM: LUMBAR SPINE - COMPLETE 4+ VIEW COMPARISON:  01/25/2005 FINDINGS: There is no evidence of lumbar spine fracture. Alignment is normal. Intervertebral disc spaces are maintained. IMPRESSION: Negative. Electronically Signed   By: Marnee Spring M.D.   On: 11/15/2019 04:45    Procedures Procedures (including critical care time)  Medications Ordered in ED Medications  droperidol (INAPSINE) 2.5 MG/ML injection 1.25 mg (1.25 mg Intravenous Given 11/15/19 0401)  diphenhydrAMINE (BENADRYL) injection 25 mg (25 mg Intravenous Given 11/15/19 0400)    ED Course  I have reviewed the triage vital signs and the nursing notes.  Pertinent labs & imaging  results that were available during my care of the patient were reviewed by me and considered in my medical decision making (see chart for details).  Clinical Course as of Nov 14 498  Wed Nov 15, 2019  0425 Microcytic anemia at baseline   Hemoglobin(!): 9.8 [AH]  0427 PDMP reviewed shows OD risk score of 640 due to sedative and narcotic rx with >5 providers and > 5 dispensing pharmacies in the last year.   [AH]  [phone] Patient asked to give urine.  She suddenly got upset. Patient standing and putting on her clothes. She wishes to leave.  Lumbar spine films reviwed and without abnormality on my interpretation. CBC appears at baseline for patient.    [AH]    Clinical Course User Index [AH] Arthor Captain, PA-C   MDM Rules/Calculators/A&P                      Although patient's labs are pending.  She has gotten dressed and wishes to be discharged.  I do not feel that she needs to wait for the remainder of her work-up and is reasonably medically screened as she is currently standing at bedside putting her own clothes on and appears appropriate for discharge at this time.  No red flag symptoms. Final Clinical Impression(s) / ED Diagnoses Final diagnoses:  Acute bilateral low back pain without sciatica    Rx / DC Orders ED Discharge Orders    None       Arthor Captain, PA-C 11/15/19 0501    Nira Conn, MD 11/15/19 [phone]

## 2020-10-02 ENCOUNTER — Encounter (HOSPITAL_COMMUNITY): Payer: Self-pay

## 2020-10-02 ENCOUNTER — Emergency Department (HOSPITAL_COMMUNITY)
Admission: EM | Admit: 2020-10-02 | Discharge: 2020-10-03 | Disposition: A | Discharge status: Home / Self Care | Payer: Medicaid Other | Attending: Emergency Medicine | Admitting: Emergency Medicine

## 2020-10-02 ENCOUNTER — Emergency Department (HOSPITAL_COMMUNITY): Payer: Medicaid Other

## 2020-10-02 DIAGNOSIS — S0181XA Laceration without foreign body of other part of head, initial encounter: Secondary | ICD-10-CM | POA: Diagnosis not present

## 2020-10-02 DIAGNOSIS — Z5321 Procedure and treatment not carried out due to patient leaving prior to being seen by health care provider: Secondary | ICD-10-CM | POA: Insufficient documentation
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
## 2020-10-02 NOTE — ED Notes (Signed)
No reply for vitals x3. 

## 2020-10-03 ENCOUNTER — Encounter (HOSPITAL_BASED_OUTPATIENT_CLINIC_OR_DEPARTMENT_OTHER): Payer: Self-pay

## 2020-10-03 ENCOUNTER — Other Ambulatory Visit: Payer: Self-pay

## 2020-10-03 ENCOUNTER — Emergency Department (HOSPITAL_BASED_OUTPATIENT_CLINIC_OR_DEPARTMENT_OTHER)
Admission: EM | Admit: 2020-10-03 | Discharge: 2020-10-03 | Disposition: A | Discharge status: Home / Self Care | Payer: Medicaid Other | Source: Home / Self Care

## 2020-10-03 DIAGNOSIS — S0191XA Laceration without foreign body of unspecified part of head, initial encounter: Secondary | ICD-10-CM | POA: Insufficient documentation

## 2020-10-03 DIAGNOSIS — Z5321 Procedure and treatment not carried out due to patient leaving prior to being seen by health care provider: Secondary | ICD-10-CM | POA: Insufficient documentation

## 2020-10-03 NOTE — ED Notes (Signed)
No reply x4. Not seen in lobby.
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
## 2021-07-06 ENCOUNTER — Other Ambulatory Visit: Payer: Self-pay

## 2021-07-06 ENCOUNTER — Encounter (HOSPITAL_COMMUNITY): Payer: Self-pay | Admitting: Emergency Medicine

## 2021-07-06 ENCOUNTER — Emergency Department (HOSPITAL_COMMUNITY)
Admission: EM | Admit: 2021-07-06 | Discharge: 2021-07-06 | Disposition: A | Discharge status: Home / Self Care | Payer: Medicaid Other | Attending: Emergency Medicine | Admitting: Emergency Medicine

## 2021-07-06 DIAGNOSIS — Y9301 Activity, walking, marching and hiking: Secondary | ICD-10-CM | POA: Insufficient documentation

## 2021-07-06 DIAGNOSIS — S90821A Blister (nonthermal), right foot, initial encounter: Secondary | ICD-10-CM | POA: Insufficient documentation

## 2021-07-06 DIAGNOSIS — S90822A Blister (nonthermal), left foot, initial encounter: Secondary | ICD-10-CM | POA: Diagnosis not present

## 2021-07-06 DIAGNOSIS — L089 Local infection of the skin and subcutaneous tissue, unspecified: Secondary | ICD-10-CM

## 2021-07-06 DIAGNOSIS — X503XXA Overexertion from repetitive movements, initial encounter: Secondary | ICD-10-CM | POA: Diagnosis not present

## 2021-07-06 DIAGNOSIS — Z87891 Personal history of nicotine dependence: Secondary | ICD-10-CM | POA: Diagnosis not present

## 2021-07-06 MED ORDER — DOXYCYCLINE HYCLATE 100 MG PO CAPS: 100.0000 mg | ORAL_CAPSULE | Freq: Two times a day (BID) | ORAL | 0 refills | Status: DC

## 2021-07-06 MED ORDER — BACITRACIN ZINC 500 UNIT/GM EX OINT: 1.0000 "application " | TOPICAL_OINTMENT | Freq: Two times a day (BID) | CUTANEOUS | 0 refills | Status: DC

## 2021-07-06 MED ORDER — BACITRACIN ZINC 500 UNIT/GM EX OINT: 1.0000 "application " | TOPICAL_OINTMENT | Freq: Two times a day (BID) | CUTANEOUS | 0 refills | Status: AC

## 2021-07-06 MED ORDER — DOXYCYCLINE HYCLATE 100 MG PO CAPS: 100.0000 mg | ORAL_CAPSULE | Freq: Two times a day (BID) | ORAL | 0 refills | Status: AC

## 2021-07-06 NOTE — ED Triage Notes (Signed)
Pt c/o BL foot pain from blisters. Pt reports redness, drainage, and black skin around blisters x 3 days.

## 2021-07-06 NOTE — ED Provider Notes (Signed)
Patient seen and examined, agree with assessment and plan by APP.  Patient here with bilateral foot pain, has some blisters, signs of likely secondary cellulitis, no systemic signs of infection. She was offered dressing, NSAIDs and Abx but has become belligerent, screaming, and yelling. Demanding narcotic pain medications. I advised that there is no indication for narcotics for her current condition. She has continued yelling and crying. States she is going to leave to go to a different facility and will be suing [place]. At that point I extricated myself from the room. She has no emergent medical condition identified. She was given Rx for Abx. Security called to bedside to escort her out.     Pollyann Savoy, MD 07/06/21 [phone]

## 2021-07-06 NOTE — Discharge Instructions (Addendum)
Apply Bacitracin to the infected area twice daily.  I have also prescribed an oral antibiotic that you can pick up from the pharmacy Gently cleanse daily and covered with a clean, dry dressing to protect the healing area from trauma and infection.  You can buy padded dressings from the pharmacy to assist with comfort while walking.  Please take all of your antibiotics until finished!   You may develop abdominal discomfort or diarrhea from the antibiotic.  You may help offset this with probiotics which you can buy or get in yogurt. Do not eat  or take the probiotics until 2 hours after your antibiotic.   Please return if your symptoms worsen or do not improve

## 2021-07-06 NOTE — ED Provider Notes (Addendum)
[facility]-EMERGENCY DEPT Provider Note   CSN: [id number] Arrival date & time: 07/06/21  0056     History Chief Complaint  Patient presents with   Foot Pain    Dana Elliott is a 40 y.o. female.  Patient has a past medical history of opiate dependence, depression, anxiety. She presents the emergency department today with bilateral foot pain.  She states that she and her family have been walking about 10 miles a day due to not having a vehicle.  She has developed multiple blisters on the bottom of her feet that started about 1 week ago.  They have worsened and have started having purulent drainage that has a very bad smell.  She says the drainage is yellowish colored but sometimes comes out black.  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  She has tried Neosporin with no relief.  She states that the pain is so bad that she is unable to walk.  She states that she has felt feverish and has had chills but has had no objective fever.   Foot Pain Pertinent negatives include no chest pain, no abdominal pain, no headaches and no shortness of breath.      Past Medical History:  Diagnosis Date   Abnormal Pap smear    Allergy    Anxiety    Chronic neck pain    Depression    Drug-seeking behavior    Headache    Interstitial cystitis    Narcotic abuse (HCC)    Ovarian cyst    PID (acute pelvic inflammatory disease)    Rheumatoid arthritis(714.0)    Tachycardia    Thyroid disease     Patient Active Problem List   Diagnosis Date Noted   Opiate dependence (HCC) 03/16/2014   Major depression 03/16/2014   Ache in joint 02/08/2013   MDD (major depressive disorder) 11/16/2011   Anxiety 11/16/2011   Rheumatoid arthritis(714.0) 11/16/2011   LSIL (low grade squamous intraepithelial lesion) on Pap smear 11/16/2011    Past Surgical History:  Procedure Laterality Date   ABDOMINAL HYSTERECTOMY     HAND SURGERY     LAPAROSCOPIC  TUBAL LIGATION     TUBAL LIGATION       OB History     Gravida  4   Para  2   Term  1   Preterm  1   AB  2   Living  2      SAB      IAB  2   Ectopic      Multiple      Live Births              Family History  Problem Relation Age of Onset   Diabetes Mother    Hypertension Mother    Alcohol abuse Paternal Grandmother    Hypothyroidism Paternal Grandfather    Other Neg Hx     Social History   Tobacco Use   Smoking status: Former    Types: Cigarettes    Quit date: 03/16/2013    Years since quitting: 8.3   Smokeless tobacco: Never  Vaping Use   Vaping Use: Never used  Substance Use Topics   Alcohol use: No   Drug use: No    Home Medications Prior to Admission medications   Medication Sig Start Date End Date Taking? Authorizing Provider  acetaminophen (TYLENOL 8 HOUR) 650 MG CR tablet Take 1 tablet (650 mg  total) by mouth every 8 (eight) hours as needed. Patient not taking: Reported on 04/10/2019 06/05/18   Derwood Kaplan, MD  bacitracin ointment Apply 1 application topically 2 (two) times daily. 07/06/21   Zarius Furr, Finis Bud, PA-C  doxycycline (VIBRAMYCIN) 100 MG capsule Take 1 capsule (100 mg total) by mouth 2 (two) times daily for 7 days. 07/06/21 07/13/21  Careen Mauch, Finis Bud, PA-C  gabapentin (NEURONTIN) 300 MG capsule Take one pill on day 1, take twice a day on day 2, then take three times a day. Patient not taking: Reported on 04/10/2019 03/30/19   Dione Booze, MD  ibuprofen (ADVIL,MOTRIN) 200 MG tablet Take 200-800 mg by mouth every 6 (six) hours as needed for moderate pain.    [provider]  ibuprofen (ADVIL,MOTRIN) 600 MG tablet Take 1 tablet (600 mg total) by mouth every 6 (six) hours as needed. Patient not taking: Reported on 04/10/2019 06/05/18   Derwood Kaplan, MD  levothyroxine (SYNTHROID, LEVOTHROID) 175 MCG tablet Take 1 tablet (175 mcg total) by mouth daily. For low thyroid function Patient not taking: Reported on 04/10/2019 07/12/18    Charlestine Night, PA-C  lidocaine (XYLOCAINE) 2 % jelly Apply 1 application topically 2 (two) times daily. 09/08/19   Palumbo, April, MD  oxyCODONE-acetaminophen (PERCOCET) 5-325 MG tablet Take 1 tablet by mouth every 4 (four) hours as needed for moderate pain. Patient not taking: Reported on 04/10/2019 03/30/19   Dione Booze, MD  cetirizine (ZYRTEC ALLERGY) 10 MG tablet Take 1 tablet (10 mg total) by mouth daily. Patient not taking: Reported on 04/07/2018 05/05/17 03/30/19  Palumbo, April, MD  famotidine (PEPCID) 20 MG tablet Take 1 tablet (20 mg total) by mouth 2 (two) times daily. Patient not taking: Reported on 04/07/2018 05/05/17 03/30/19  Nicanor Alcon, April, MD    Allergies    Penicillins, Tramadol, Reglan [metoclopramide], and Toradol [ketorolac tromethamine]  Review of Systems   Review of Systems  Constitutional:  Positive for chills. Negative for fever.  HENT:  Negative for congestion, rhinorrhea and sore throat.   Eyes:  Negative for visual disturbance.  Respiratory:  Negative for cough, chest tightness and shortness of breath.   Cardiovascular:  Negative for chest pain, palpitations and leg swelling.  Gastrointestinal:  Negative for abdominal pain, blood in stool, constipation, diarrhea, nausea and vomiting.  Genitourinary:  Negative for dysuria, flank pain and hematuria.  Musculoskeletal:  Negative for back pain.  Skin:  Positive for wound. Negative for rash.  Neurological:  Negative for dizziness, syncope, weakness, light-headedness and headaches.  Psychiatric/Behavioral:  Negative for confusion.   All other systems reviewed and are negative.  Physical Exam Updated Vital Signs BP 100/65   Pulse 86   Temp 98 F (36.7 C) (Oral)   Resp 18   Ht 5\' 6"  (1.676 m)   Wt 65.8 kg   LMP 07/28/2015   SpO2 99%   BMI 23.40 kg/m   Physical Exam Vitals and nursing note reviewed.  Constitutional:      General: She is not in acute distress.    Appearance: Normal appearance. She is  well-developed. She is not ill-appearing, toxic-appearing or diaphoretic.  HENT:     Head: Normocephalic and atraumatic.     Nose: No nasal deformity.     Mouth/Throat:     Lips: Pink. No lesions.  Eyes:     General: Gaze aligned appropriately. No scleral icterus.       Right eye: No discharge.        Left eye: No  discharge.     Conjunctiva/sclera: Conjunctivae normal.     Right eye: Right conjunctiva is not injected. No exudate or hemorrhage.    Left eye: Left conjunctiva is not injected. No exudate or hemorrhage. Pulmonary:     Effort: Pulmonary effort is normal. No respiratory distress.  Skin:    General: Skin is warm and dry.     Findings: Erythema and lesion present.     Comments: Patient has multiple blisters on soles of bilateral feet.  Most of these blisters have localized surrounding erythema and swelling with no obvious drainage.  These areas are significantly tender to the touch.  Blisters seem to be opened already.  I do not see any pustular areas.  Does not appear cellulitic  Neurological:     Mental Status: She is alert and oriented to person, place, and time.  Psychiatric:        Mood and Affect: Mood normal.        Speech: Speech normal.        Behavior: Behavior normal. Behavior is cooperative.    ED Results / Procedures / Treatments   Labs (all labs ordered are listed, but only abnormal results are displayed) Labs Reviewed - No data to display  EKG None  Radiology No results found.  Procedures Procedures   Medications Ordered in ED Medications - No data to display  ED Course  I have reviewed the triage vital signs and the nursing notes.  Pertinent labs & imaging results that were available during my care of the patient were reviewed by me and considered in my medical decision making (see chart for details).    MDM Rules/Calculators/A&P                         This is a well-appearing 40 year old-year-old female presents with bilateral blisters to  the soles of her feet that have surrounding erythema and swelling.  I do not see any obvious drainage of these areas or any purulent drainage.  They do appear to be infected locally, however I do not suspect the patient has a systemic infection.  On arrival, her vitals are reassuring.  She is afebrile.  She has no systemic symptoms.  Think patient likely has a local secondary soft tissue infection due to her blisters.  There are no signs of systemic infection. We will treat patient with doxycycline and bacitracin ointment for the area.  We will apply a padded dressing to assist patient with increased need for walking.  Patient given instructions for wound care management.  During her dressing change, patient became verbally belligerent regarding not providing her with pain medication. I offered her both Tylenol and Ibuprofen as well as suggesting alternative ways to reduce the pressure off her feet. She wished to continue the dressing change on her own.  Dr. Bernette Mayers went to see patient with no relief of aggression. She was provided antibiotic prescription prior to discharge.  At this point, security was called and patient was escorted from the hospital.    Final Clinical Impression(s) / ED Diagnoses Final diagnoses:  Blisters of multiple sites, infected    Rx / DC Orders ED Discharge Orders          Ordered    bacitracin ointment  2 times daily,   Status:  Discontinued        07/06/21 0751    doxycycline (VIBRAMYCIN) 100 MG capsule  2 times daily,   Status:  Discontinued  07/06/21 0751    doxycycline (VIBRAMYCIN) 100 MG capsule  2 times daily        07/06/21 0755    bacitracin ointment  2 times daily        07/06/21 0755             Rithvik Orcutt, Finis Bud, PA-C 07/06/21 0802    Claudie Leach, PA-C 07/06/21 0601    Pollyann Savoy, MD 07/06/21 [phone]

## 2021-07-06 NOTE — ED Notes (Signed)
Provider in to apply dressings. Patient became loud and yelling could hear her outside of room. Security called. Patient refused to have dressing applied. Dressing material put in bag and sent with patient.
